# Patient Record
Sex: Female | Born: 1945 | Race: White | Hispanic: No | Smoking: Former smoker
Health system: Southern US, Community
[De-identification: ages and names within clinical notes are randomized; demographics above are authoritative.]

## PROBLEM LIST (undated history)

## (undated) DIAGNOSIS — F0390 Unspecified dementia without behavioral disturbance: Secondary | ICD-10-CM

## (undated) DIAGNOSIS — F32A Depression, unspecified: Secondary | ICD-10-CM

## (undated) DIAGNOSIS — T148XXA Other injury of unspecified body region, initial encounter: Secondary | ICD-10-CM

## (undated) DIAGNOSIS — I509 Heart failure, unspecified: Secondary | ICD-10-CM

## (undated) DIAGNOSIS — H9190 Unspecified hearing loss, unspecified ear: Secondary | ICD-10-CM

## (undated) DIAGNOSIS — IMO0001 Reserved for inherently not codable concepts without codable children: Secondary | ICD-10-CM

## (undated) DIAGNOSIS — F419 Anxiety disorder, unspecified: Secondary | ICD-10-CM

## (undated) DIAGNOSIS — I5032 Chronic diastolic (congestive) heart failure: Secondary | ICD-10-CM

## (undated) DIAGNOSIS — I10 Essential (primary) hypertension: Secondary | ICD-10-CM

## (undated) DIAGNOSIS — F329 Major depressive disorder, single episode, unspecified: Secondary | ICD-10-CM

## (undated) DIAGNOSIS — Z9981 Dependence on supplemental oxygen: Secondary | ICD-10-CM

## (undated) DIAGNOSIS — E785 Hyperlipidemia, unspecified: Secondary | ICD-10-CM

## (undated) DIAGNOSIS — R195 Other fecal abnormalities: Secondary | ICD-10-CM

## (undated) DIAGNOSIS — K5790 Diverticulosis of intestine, part unspecified, without perforation or abscess without bleeding: Secondary | ICD-10-CM

## (undated) DIAGNOSIS — J449 Chronic obstructive pulmonary disease, unspecified: Secondary | ICD-10-CM

## (undated) HISTORY — DX: Diverticulosis of intestine, part unspecified, without perforation or abscess without bleeding: K57.90

## (undated) HISTORY — DX: Depression, unspecified: F32.A

## (undated) HISTORY — DX: Other fecal abnormalities: R19.5

## (undated) HISTORY — DX: Major depressive disorder, single episode, unspecified: F32.9

## (undated) HISTORY — DX: Hyperlipidemia, unspecified: E78.5

## (undated) HISTORY — DX: Reserved for inherently not codable concepts without codable children: IMO0001

## (undated) HISTORY — DX: Essential (primary) hypertension: I10

## (undated) HISTORY — DX: Anxiety disorder, unspecified: F41.9

## (undated) HISTORY — DX: Other injury of unspecified body region, initial encounter: T14.8XXA

## (undated) HISTORY — DX: Unspecified hearing loss, unspecified ear: H91.90

## (undated) HISTORY — PX: OTHER SURGICAL HISTORY: SHX169

---

## 1990-05-13 HISTORY — PX: FEMUR FRACTURE SURGERY: SHX633

## 2004-08-10 ENCOUNTER — Ambulatory Visit: Payer: Self-pay | Admitting: Internal Medicine

## 2005-05-13 DIAGNOSIS — K5790 Diverticulosis of intestine, part unspecified, without perforation or abscess without bleeding: Secondary | ICD-10-CM

## 2005-05-13 HISTORY — PX: COLONOSCOPY: SHX174

## 2005-05-13 HISTORY — DX: Diverticulosis of intestine, part unspecified, without perforation or abscess without bleeding: K57.90

## 2005-06-28 ENCOUNTER — Ambulatory Visit: Payer: Self-pay | Admitting: Internal Medicine

## 2008-02-11 ENCOUNTER — Other Ambulatory Visit: Payer: Self-pay

## 2008-02-12 ENCOUNTER — Other Ambulatory Visit: Payer: Self-pay

## 2008-02-12 ENCOUNTER — Inpatient Hospital Stay: Payer: Self-pay | Admitting: Internal Medicine

## 2009-05-13 DIAGNOSIS — T148XXA Other injury of unspecified body region, initial encounter: Secondary | ICD-10-CM

## 2009-05-13 HISTORY — DX: Other injury of unspecified body region, initial encounter: T14.8XXA

## 2009-11-29 ENCOUNTER — Inpatient Hospital Stay (HOSPITAL_COMMUNITY)
Admission: EM | Admit: 2009-11-29 | Discharge: 2009-12-01 | Payer: Self-pay | Source: Home / Self Care | Admitting: Emergency Medicine

## 2010-01-04 ENCOUNTER — Ambulatory Visit (HOSPITAL_COMMUNITY): Admission: RE | Admit: 2010-01-04 | Discharge: 2010-01-04 | Payer: Self-pay | Admitting: Orthopedic Surgery

## 2010-01-10 ENCOUNTER — Emergency Department: Payer: Self-pay | Admitting: Unknown Physician Specialty

## 2010-02-27 ENCOUNTER — Ambulatory Visit (HOSPITAL_COMMUNITY): Admission: RE | Admit: 2010-02-27 | Discharge: 2010-02-27 | Payer: Self-pay | Admitting: Neurosurgery

## 2010-04-13 ENCOUNTER — Ambulatory Visit: Payer: Self-pay | Admitting: Specialist

## 2010-04-20 ENCOUNTER — Ambulatory Visit: Payer: Self-pay | Admitting: Specialist

## 2010-07-28 LAB — CBC
Hemoglobin: 13 g/dL (ref 12.0–15.0)
RBC: 3.83 MIL/uL — ABNORMAL LOW (ref 3.87–5.11)
WBC: 5.8 10*3/uL (ref 4.0–10.5)

## 2010-07-28 LAB — BASIC METABOLIC PANEL
Calcium: 8.7 mg/dL (ref 8.4–10.5)
GFR calc Af Amer: >60 mL/min (ref >60)
GFR calc non Af Amer: >60 mL/min (ref >60)
Potassium: 3.7 mEq/L (ref 3.5–5.1)
Sodium: 138 mEq/L (ref 135–145)

## 2011-03-11 ENCOUNTER — Emergency Department: Payer: Self-pay | Admitting: Emergency Medicine

## 2011-05-13 ENCOUNTER — Inpatient Hospital Stay: Payer: Self-pay | Admitting: Internal Medicine

## 2011-05-14 LAB — CBC WITH DIFFERENTIAL/PLATELET
Basophil %: 0.4 %
Eosinophil #: 0 10*3/uL (ref 0.0–0.7)
Eosinophil %: 0.6 %
HGB: 13.1 g/dL (ref 12.0–16.0)
Lymphocyte #: 1.6 10*3/uL (ref 1.0–3.6)
MCH: 31 pg (ref 26.0–34.0)
Monocyte #: 0.5 10*3/uL (ref 0.0–0.7)
Neutrophil #: 3.9 10*3/uL (ref 1.4–6.5)
Neutrophil %: 63.9 %
RBC: 4.25 10*6/uL (ref 3.80–5.20)
WBC: 6.1 10*3/uL (ref 3.6–11.0)

## 2011-05-14 LAB — COMPREHENSIVE METABOLIC PANEL
Albumin: 3.3 g/dL — ABNORMAL LOW (ref 3.4–5.0)
Alkaline Phosphatase: 74 U/L (ref 50–136)
Anion Gap: 12 (ref 7–16)
BUN: 20 mg/dL — ABNORMAL HIGH (ref 7–18)
Chloride: 104 mmol/L (ref 98–107)
Creatinine: 0.98 mg/dL (ref 0.60–1.30)
Potassium: 3.1 mmol/L — ABNORMAL LOW (ref 3.5–5.1)
SGOT(AST): 16 U/L (ref 15–37)
Sodium: 139 mmol/L (ref 136–145)

## 2011-05-14 LAB — TROPONIN I: Troponin-I: 0.06 ng/mL — ABNORMAL HIGH

## 2011-05-14 LAB — HEMOGLOBIN A1C: Hemoglobin A1C: 6.4 % — ABNORMAL HIGH (ref 4.2–6.3)

## 2011-05-14 LAB — MAGNESIUM: Magnesium: 1.5 mg/dL — ABNORMAL LOW

## 2011-05-14 LAB — LIPID PANEL
HDL Cholesterol: 50 mg/dL (ref 40–60)
VLDL Cholesterol, Calc: 46 mg/dL — ABNORMAL HIGH (ref 5–40)

## 2011-05-15 LAB — CBC WITH DIFFERENTIAL/PLATELET
Basophil #: 0.1 10*3/uL (ref 0.0–0.1)
Eosinophil #: 0 10*3/uL (ref 0.0–0.7)
HCT: 39.5 % (ref 35.0–47.0)
Lymphocyte %: 11.8 %
Monocyte #: 0.7 10*3/uL (ref 0.0–0.7)
Monocyte %: 7.2 %
Neutrophil #: 7.3 10*3/uL — ABNORMAL HIGH (ref 1.4–6.5)
Neutrophil %: 80.3 %
RDW: 12.9 % (ref 11.5–14.5)
WBC: 9.3 10*3/uL (ref 3.6–11.0)

## 2011-05-15 LAB — BASIC METABOLIC PANEL
Anion Gap: 12 (ref 7–16)
Chloride: 107 mmol/L (ref 98–107)
Co2: 22 mmol/L (ref 21–32)
Creatinine: 0.76 mg/dL (ref 0.60–1.30)
Osmolality: 283 (ref 275–301)
Potassium: 3.6 mmol/L (ref 3.5–5.1)

## 2011-05-15 LAB — CK TOTAL AND CKMB (NOT AT ARMC): CK-MB: 6.5 ng/mL — ABNORMAL HIGH (ref 0.5–3.6)

## 2011-05-16 ENCOUNTER — Inpatient Hospital Stay: Payer: Self-pay | Admitting: Psychiatry

## 2011-05-17 LAB — URINALYSIS, COMPLETE
Blood: NEGATIVE
Nitrite: NEGATIVE
Protein: NEGATIVE
Specific Gravity: 1.016 (ref 1.003–1.030)

## 2011-05-17 LAB — BEHAVIORAL MEDICINE 1 PANEL
Albumin: 3.7 g/dL (ref 3.4–5.0)
Alkaline Phosphatase: 69 U/L (ref 50–136)
BUN: 15 mg/dL (ref 7–18)
Basophil %: 0.1 %
Bilirubin,Total: 0.4 mg/dL (ref 0.2–1.0)
Eosinophil #: 0.1 10*3/uL (ref 0.0–0.7)
Eosinophil %: 1.5 %
HCT: 36 % (ref 35.0–47.0)
HGB: 12.4 g/dL (ref 12.0–16.0)
MCH: 32.3 pg (ref 26.0–34.0)
MCHC: 34.5 g/dL (ref 32.0–36.0)
MCV: 94 fL (ref 80–100)
Monocyte #: 0.4 10*3/uL (ref 0.0–0.7)
Neutrophil #: 3.7 10*3/uL (ref 1.4–6.5)
Neutrophil %: 71.1 %
Osmolality: 282 (ref 275–301)
Potassium: 3.5 mmol/L (ref 3.5–5.1)
RBC: 3.85 10*6/uL (ref 3.80–5.20)
Thyroid Stimulating Horm: 2.44 u[IU]/mL
Total Protein: 7.2 g/dL (ref 6.4–8.2)

## 2011-05-18 ENCOUNTER — Inpatient Hospital Stay: Payer: Self-pay | Admitting: Specialist

## 2011-05-18 LAB — BASIC METABOLIC PANEL
Calcium, Total: 9.1 mg/dL (ref 8.5–10.1)
Chloride: 107 mmol/L (ref 98–107)
Co2: 24 mmol/L (ref 21–32)
Creatinine: 0.8 mg/dL (ref 0.60–1.30)
EGFR (African American): >60
Potassium: 3.5 mmol/L (ref 3.5–5.1)

## 2011-05-18 LAB — CBC WITH DIFFERENTIAL/PLATELET
HCT: 34 % — ABNORMAL LOW (ref 35.0–47.0)
Lymphocyte %: 7 %
Monocyte %: 6.3 %
Neutrophil #: 10.3 10*3/uL — ABNORMAL HIGH (ref 1.4–6.5)
Platelet: 208 10*3/uL (ref 150–440)
RDW: 14.2 % (ref 11.5–14.5)
WBC: 11.9 10*3/uL — ABNORMAL HIGH (ref 3.6–11.0)

## 2011-05-18 LAB — FOLATE: Folic Acid: 12.4 ng/mL (ref 3.1–100.0)

## 2011-05-19 LAB — BASIC METABOLIC PANEL
Anion Gap: 12 (ref 7–16)
BUN: 16 mg/dL (ref 7–18)
EGFR (Non-African Amer.): >60
Glucose: 90 mg/dL (ref 65–99)
Osmolality: 286 (ref 275–301)

## 2011-05-20 LAB — MAGNESIUM: Magnesium: 1.5 mg/dL — ABNORMAL LOW

## 2013-01-13 DIAGNOSIS — S81801A Unspecified open wound, right lower leg, initial encounter: Secondary | ICD-10-CM | POA: Insufficient documentation

## 2013-01-26 ENCOUNTER — Ambulatory Visit (INDEPENDENT_AMBULATORY_CARE_PROVIDER_SITE_OTHER): Payer: Medicare Other | Admitting: General Surgery

## 2013-01-26 ENCOUNTER — Encounter: Payer: Self-pay | Admitting: General Surgery

## 2013-01-26 VITALS — BP 150/70 | HR 80 | Resp 16 | Ht 64.0 in | Wt 179.0 lb

## 2013-01-26 DIAGNOSIS — S81009A Unspecified open wound, unspecified knee, initial encounter: Secondary | ICD-10-CM

## 2013-01-26 DIAGNOSIS — S81001A Unspecified open wound, right knee, initial encounter: Secondary | ICD-10-CM

## 2013-01-26 DIAGNOSIS — S81801A Unspecified open wound, right lower leg, initial encounter: Secondary | ICD-10-CM
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
## 2013-01-26 NOTE — Patient Instructions (Addendum)
Patient to return on Thursday 01/28/13 for nurse visit.

## 2013-01-27 ENCOUNTER — Encounter: Payer: Self-pay | Admitting: General Surgery

## 2013-01-28 ENCOUNTER — Ambulatory Visit (INDEPENDENT_AMBULATORY_CARE_PROVIDER_SITE_OTHER): Payer: Medicare Other | Admitting: *Deleted

## 2013-01-28 DIAGNOSIS — S81009A Unspecified open wound, unspecified knee, initial encounter: Secondary | ICD-10-CM

## 2013-01-28 DIAGNOSIS — S81001A Unspecified open wound, right knee, initial encounter: Secondary | ICD-10-CM

## 2013-01-28 NOTE — Progress Notes (Signed)
norco 5/325 mg 1 tablet q4 hr po prn pain #40 no refills called in per Dr Lemar Livings Wound with more red irritation and edema of surrounding tissue. Visible drainage from duoderm noted. [employer] broadcast assistant, unna boot care reviewed with patient. Follow up with Dr Lemar Livings on Monday for further evaluation and progress.

## 2013-01-28 NOTE — Patient Instructions (Addendum)
Monday for unna boot change

## 2013-01-31 LAB — AEROBIC CULTURE

## 2013-02-01 ENCOUNTER — Encounter: Payer: Self-pay | Admitting: General Surgery

## 2013-02-01 ENCOUNTER — Ambulatory Visit (INDEPENDENT_AMBULATORY_CARE_PROVIDER_SITE_OTHER): Payer: Medicare Other | Admitting: General Surgery

## 2013-02-01 VITALS — BP 140/80 | HR 88 | Resp 14 | Ht 64.0 in | Wt 180.0 lb

## 2013-02-01 DIAGNOSIS — S81001A Unspecified open wound, right knee, initial encounter: Secondary | ICD-10-CM

## 2013-02-01 DIAGNOSIS — S81009A Unspecified open wound, unspecified knee, initial encounter: Secondary | ICD-10-CM | POA: Insufficient documentation

## 2013-02-01 MED ORDER — AMOXICILLIN-POT CLAVULANATE 875-125 MG PO TABS: 1.0000 | ORAL_TABLET | Freq: Two times a day (BID) | ORAL | Status: AC
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
## 2013-02-01 NOTE — Patient Instructions (Signed)
Patient to return on Tuesday 02/09/13 for a nurse visit.

## 2013-02-03 ENCOUNTER — Ambulatory Visit: Payer: Medicare Other | Admitting: General Surgery

## 2013-02-09 ENCOUNTER — Ambulatory Visit (INDEPENDENT_AMBULATORY_CARE_PROVIDER_SITE_OTHER): Payer: Medicare Other | Admitting: *Deleted

## 2013-02-09 DIAGNOSIS — S81009A Unspecified open wound, unspecified knee, initial encounter: Secondary | ICD-10-CM

## 2013-02-09 DIAGNOSIS — S81001A Unspecified open wound, right knee, initial encounter: Secondary | ICD-10-CM

## 2013-02-09 NOTE — Patient Instructions (Signed)
The patient is aware to call back for any questions or concerns.  

## 2013-02-09 NOTE — Progress Notes (Signed)
The patient came in today for unna boot dressing change.  Right leg were washed with soap and water.  Unna boot, kerlix and coban applied.  Edema improving.The area of concern is improving. Follow up as scheduled.

## 2013-02-16 ENCOUNTER — Ambulatory Visit: Payer: Medicare Other | Admitting: *Deleted

## 2013-02-16 DIAGNOSIS — S81001A Unspecified open wound, right knee, initial encounter: Secondary | ICD-10-CM

## 2013-02-16 NOTE — Patient Instructions (Addendum)
Neosporin and band aid until area completely healed Keep area clean Follow up as needed

## 2013-02-16 NOTE — Progress Notes (Signed)
Unna boot removed. Area of concern has greatly improved.  Only a tiny open area remains.

## 2013-12-22 ENCOUNTER — Encounter: Payer: Self-pay | Admitting: General Surgery

## 2013-12-22 ENCOUNTER — Ambulatory Visit (INDEPENDENT_AMBULATORY_CARE_PROVIDER_SITE_OTHER): Payer: Medicare Other | Admitting: General Surgery

## 2013-12-22 VITALS — BP 184/76 | HR 88 | Temp 98.1°F | Resp 14 | Ht 66.0 in | Wt 180.0 lb

## 2013-12-22 DIAGNOSIS — S81812A Laceration without foreign body, left lower leg, initial encounter: Secondary | ICD-10-CM

## 2013-12-22 DIAGNOSIS — S81809A Unspecified open wound, unspecified lower leg, initial encounter: Secondary | ICD-10-CM
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
## 2013-12-22 NOTE — Patient Instructions (Signed)
The patient is aware to call back for any questions or concerns.  

## 2013-12-23 DIAGNOSIS — S81819A Laceration without foreign body, unspecified lower leg, initial encounter: Secondary | ICD-10-CM | POA: Insufficient documentation

## 2013-12-27 ENCOUNTER — Telehealth: Payer: Self-pay | Admitting: *Deleted

## 2013-12-27 NOTE — Telephone Encounter (Signed)
Message copied by Dominga Ferry on Mon Dec 27, 2013  2:14 PM ------      Message from: [place], [place]      Created: Thu Dec 23, 2013  6:17 PM       The patient reports that she call for an appointment and was denied one, even though she had been seen for a similar problem within the past year. Please investigate. ------

## 2013-12-27 NOTE — Telephone Encounter (Signed)
Message for patient to call the office.  

## 2013-12-28 ENCOUNTER — Ambulatory Visit (INDEPENDENT_AMBULATORY_CARE_PROVIDER_SITE_OTHER): Payer: Medicare Other | Admitting: General Surgery

## 2013-12-28 ENCOUNTER — Encounter: Payer: Self-pay | Admitting: General Surgery

## 2013-12-28 VITALS — BP 132/60 | HR 72 | Resp 12 | Ht 66.0 in | Wt 177.0 lb

## 2013-12-28 DIAGNOSIS — L97909 Non-pressure chronic ulcer of unspecified part of unspecified lower leg with unspecified severity: Secondary | ICD-10-CM

## 2013-12-28 DIAGNOSIS — S81802D Unspecified open wound, left lower leg, subsequent encounter: Secondary | ICD-10-CM
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
## 2013-12-28 NOTE — Patient Instructions (Signed)
Nurse one week

## 2013-12-29 DIAGNOSIS — S81802A Unspecified open wound, left lower leg, initial encounter: Secondary | ICD-10-CM | POA: Insufficient documentation

## 2014-01-04 ENCOUNTER — Ambulatory Visit: Payer: Self-pay | Admitting: *Deleted

## 2014-01-04 DIAGNOSIS — S81802D Unspecified open wound, left lower leg, subsequent encounter: Secondary | ICD-10-CM

## 2014-01-04 NOTE — Patient Instructions (Signed)
One week

## 2014-01-04 NOTE — Progress Notes (Signed)
The patient came in today for unna boot dressing change.  Left leg was washed with soap and water.  Unna boot, kerlix and coban applied.  Edema improving.The area of concern is improving. Measures 1.3 x 3. Clean, no infection. Follow up as scheduled.

## 2014-01-04 NOTE — Addendum Note (Signed)
Addended by: Carson Myrtle on: 01/04/2014 11:05 AM   Modules accepted: Level of Service

## 2014-01-06 ENCOUNTER — Telehealth: Payer: Self-pay | Admitting: *Deleted

## 2014-01-06 NOTE — Telephone Encounter (Signed)
Patient reports that she did not call to try to make an appointment prior to the original appointment that was scheduled for 12-22-13 which was made by Dr. Brynda Greathouse.   This patient was reminded of follow up appointment with this office next week. She verbalizes understanding of date and time.

## 2014-01-11 ENCOUNTER — Encounter: Payer: Self-pay | Admitting: General Surgery

## 2014-01-11 ENCOUNTER — Ambulatory Visit (INDEPENDENT_AMBULATORY_CARE_PROVIDER_SITE_OTHER): Payer: Medicare Other | Admitting: General Surgery

## 2014-01-11 VITALS — BP 134/72 | HR 76 | Resp 12 | Ht 66.0 in | Wt 175.0 lb

## 2014-01-11 DIAGNOSIS — S81809A Unspecified open wound, unspecified lower leg, initial encounter: Secondary | ICD-10-CM

## 2014-01-11 DIAGNOSIS — S81812A Laceration without foreign body, left lower leg, initial encounter: Secondary | ICD-10-CM
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
## 2014-01-11 NOTE — Patient Instructions (Signed)
Patient to return as needed. The patient is aware to call back for any questions or concerns. 

## 2014-03-14 ENCOUNTER — Encounter: Payer: Self-pay | Admitting: General Surgery

## 2014-07-14 ENCOUNTER — Other Ambulatory Visit: Payer: Self-pay | Admitting: *Deleted

## 2014-09-04 NOTE — Discharge Summary (Signed)
PATIENT NAME:  Rachel [name], Rachel [name] MR#:  [id number] DATE OF BIRTH:  March 29, 1946  DATE OF ADMISSION:  05/13/2011 DATE OF DISCHARGE:  05/16/2011  PRIMARY CARE PHYSICIAN: Nicky Pugh, MD  DISCHARGE DIAGNOSES:  1. Encephalopathy/altered mental status, now resolved, likely due to drug overdose (30 tablets of Ambien, 20 tablets of Xanax, 10 tablets of hydrocodone, and 10 to 12 tablets of oxycodone, as per her husband).  2. 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  3. 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  4. Abdominal pain, likely due to drug overdose, possible acute on chronic functional gastrointestinal disorder, on Bentyl. 5. Hyperkalemia, now resolved.   SECONDARY DIAGNOSES:  1. Hypertension.  2. Chronic pain syndrome.  3. Depression/anxiety.   CONSULTANTS: Felizardo Hoffmann, MD - Psychiatry.  PROCEDURES/RADIOLOGY:  CT scan of the head without contrast on 05/13/2011: No acute intracranial process.   Abdominal flat and erect on 05/14/2011: nonobstructive bowel gas pattern.   MAJOR LABORATORY PANEL: Urinalysis on admission was negative.   Blood cultures x2 were negative.   Urine culture was negative.   HISTORY AND SHORT HOSPITAL COURSE: The patient is a 69 year old female with the above-mentioned medical problems who was admitted for metabolic encephalopathy likely secondary to drug overdose. There was no exact description of what she overdosed on at admission, although the next day her husband brought the exact dosage and tablets of overdose and the patient needed critical care unit transfer due to malignant hypertension as blood pressure consistently stayed in the 180s to 200 and it was difficult to control on the medical floor. She was slowly started on benzodiazepine withdrawal protocol as she seemed to be having benzodiazepine withdrawal. Psychiatry consultation  was obtained with Dr. Rhona Raider who agreed with the same and was placed on withdrawal protocol. She responded fairly well. Her blood pressure seemed to be much better controlled after that and she was transferred to inpatient Behavioral Medicine after discussion with Dr. Rhona Raider who accepted the patient for transfer.   DISCHARGE PHYSICAL EXAMINATION:  VITALS: On the date of discharge, her temperature was 98.2, heart rate 78 per minute, respirations 14 per minute, blood pressure 130/74 mmHg, and she was saturating 98% on 2 liters oxygen via nasal cannula.   HEART:  S1 and S2 normal. No murmurs, rubs, or gallops.   LUNGS: Clear to auscultation bilaterally. No wheezes, rales, rhonchi, or crepitation.   ABDOMEN: Soft, benign.   NEUROLOGIC: Nonfocal examination.   All other physical examination remained at the baseline.   DISCHARGE MEDICATIONS:  1. Bentyl 20 mg p.o. four times daily for 3 days as needed.  2. Zofran 8 mg p.o. three times daily as needed.  3. Cardizem 120 mg p.o. three times daily. 4. Dulcolax as needed. 5. Glucosamine with chondroitin 1 tablet three times daily. 6. Caltrate with vitamin D 1 tablet p.o. twice a day. 7. Phentermine 37.5 mg p.o. daily, psychiatric medication per Dr. Rhona Raider. 8. Norvasc 5 mg p.o. daily.  9. Hydralazine 10 mg p.o. four times daily.   DISCHARGE DIET: Low sodium.   DISCHARGE ACTIVITY: As tolerated.   DISCHARGE INSTRUCTIONS AND FOLLOW-UP: The patient was instructed to follow-up with her primary care physician, Dr. Nicky Pugh, in 1 to 2 weeks after discharge from the psychiatry floor. She will need follow-up with psychiatry as an outpatient in 2 to 3 weeks.   TOTAL TIME DISCHARGING THIS PATIENT: 55 minutes. ____________________________ Lucina Mellow. Manuella Ghazi, MD vss:slb D: 05/19/2011 23:57:00  ET T: 05/21/2011 12:06:31 ET JOB#: [id number]  cc: Trinity Haun S. Manuella Ghazi, MD, <Dictator> Mikeal Hawthorne. Brynda Greathouse, MD Drue Stager. [place], MD Remer Macho  MD ELECTRONICALLY SIGNED 05/21/2011 19:52

## 2014-09-04 NOTE — Discharge Summary (Signed)
PATIENT NAME:  Rachel, [name] MR#:  [id number] DATE OF BIRTH:  04-30-1946  DATE OF ADMISSION:  05/16/2011 DATE OF DISCHARGE:  05/18/2011  HISTORY OF PRESENT ILLNESS: Rachel [name] is a 69 year old female admitted to the Behavioral Medicine Unit on 05/16/2011 due to depression. She was initially admitted to the general medical portion of the [facility] after an intentional overdose on the 31st of December 2012.   Please see the admission history and physical dictation as well as the undersigned's consultation.   The patient had required admission to a hospital in [place], [place] for five days approximately one month prior to admission for malignant hypertension. Her hyperintensive medications upon admission were clonidine 0.3 mg t.i.d. and Cardizem 120 mg 3 times daily.   It was unclear to what extent Rachel [name] was drinking alcohol as well as possibly misusing her using her Xanax. Therefore, an Ativan CIWA. protocol was initiated while Rachel [name] was on the medical floor.   Initially on the general medical Rachel [name] demonstrated tremor as well as sweats along with nausea and vomiting. She was given a total of three doses of 2 mg lorazepam between January 1st and 11:00 p.m. on January 2nd. After that she no longer met CIWA criteria for Ativan use.   She was transferred to the inpatient Behavioral Medicine Unit for further evaluation and treatment of depression. She was initiated on Cymbalta and this was increased to 20 mg at bedtime.   Rachel [name] was transferred to the inpatient Behavioral Medicine Unit with the recommendation of Norvasc 5 milligrams daily and Hydralazine 10 mg q.i.d.    CONDITION ON DISCHARGE: By the 5th of January Rachel [name]' blood pressure increased to 233/113. This was accompanied by a pulse of 130. She continued with no tremulousness on the 4th of January as well as no sweating and no fever. By a graft on the 5th of January at the time of her severe blood pressure and  pulse elevation, there was no fever. She did not meet CIWA criteria for withdrawal. By nursing report she experienced a grand mal seizure and required transfer to a general medical ward.   MENTAL STATUS EXAM UPON DISCHARGE: The undersigned was not present in the hospital at the time of discharge (the undersigned was not on-call and making rounds) however, the undersigned was informed by the nurses of all signs and symptoms that Rachel [name] was displaying. They stated that after the grand mal seizure, she was in a typical post ictal delirium state and that she had not been demonstrating tremors, sweats, or fever. Therefore, she had not met any criteria to restart an Ativan CIWA protocol.   DISCHARGE DIAGNOSES:  AXIS I:  1. Major depressive disorder, recurrent, severe.  2. Anxiety disorder, not otherwise specified.  3. Delirium due to postictal state of grand mal seizure and malignant hypertension.  4. Rule out sedative hypnotic abuse and alcohol abuse.   AXIS II: Deferred.   AXIS III: Preadmission history of malignant hypertension with malignant hypertension and tachycardia persisting beyond criteria for the Ativan CIWA protocol, seizures secondary to malignant hypertension.   AXIS IV: General medical.   AXIS V: 15.   DIET: Unable to tolerate p.o. at the time of transfer.   ACTIVITY:  Per CCU protocol.   DISCHARGE MEDICATIONS: Hypertensive control per the general medical staff. There will be an neurology consult as well as Dilantin loading.   PSYCHIATRIC FOLLOW-UP: The undersigned will follow-up with Rachel [name] on the general medical floor.  ____________________________ Drue Stager. Asheton Scheffler, MD jsw:rbg D: 05/20/2011 18:58:00 ET T: 05/21/2011 15:31:30 ET   JOB#: [id number] [name] MD ELECTRONICALLY SIGNED 06/06/2011 11:51
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
## 2014-09-04 NOTE — Discharge Summary (Signed)
PATIENT NAME:  Rachel [name], Rachel [name] MR#:  [id number] DATE OF BIRTH:  [birth year]  DATE OF ADMISSION:  05/18/2011 DATE OF DISCHARGE:  05/20/2011  HISTORY AND PHYSICAL: For a detailed note, please take a look at the History and Physical done by Dr. Lyndel Safe on admission.   DIAGNOSES AT DISCHARGE:  1. Recurrent seizures secondary to possible malignant hypertension.   2. Benzodiazepine withdrawal complicated with underlying hypomagnesemia. 3. Malignant hypertension, now improved.  4. Anxiety/depression.  5. Status post left cochlear implant.   DIET: The patient is being discharged on a low-sodium diet.   ACTIVITY: As tolerated.   RESTRICTIONS: . The patient is to not drive until cleared and seen by Neurology.   FOLLOWUP:  1. Followup is with Dr. Brynda Greathouse in the next 1 to 2 weeks.  2. Follow up with Dr. Jennings Books in the next 1 to 2 weeks.   DISCHARGE MEDICATIONS:  1. Bentyl 20 mg q.i.d. as needed. 2. Caltrate 600 mg daily.  3. Xanax 0.5 mg t.i.d. as needed.  4. Fluoxetine 40 mg daily.  5. Colace 100 mg daily.  6. Lovastatin 20 mg daily.  7. Promethazine q.i.d. as needed.  8. Magnesium oxide 400 mg daily.  9. Fish oil daily.  10. Ambien 10 mg at bedtime.  11. Keppra 500 mg b.i.d.  12. Dilantin 100 mg b.i.d. x1 day, then Dilantin 100 mg daily x2 days then discontinue. 13. Cardizem CD 120 mg daily.  14. Amlodipine 5 mg daily.  15. Lisinopril 10 mg daily.  16. Hydralazine 25 mg q.i.d.   CONSULTANTS DURING THE HOSPITAL COURSE:  1. Dr. Rhona Raider from Psychiatry.  2. Dr.  Jennings Books from Neurology.   PERTINENT STUDIES: CT scan of the head done on January 5th showing no acute intracranial abnormality.   HOSPITAL COURSE: This is a 69 year old female who was transferred from the Behavioral Medicine Service to the Medical floor due to recurrent seizures.   1. Recurrent seizures: The exact etiology of the seizures is unclear but likely multifactorial. The patient had three episodes of  seizures prior to being transferred to the Medical floor. The patient was loaded with Dilantin and maintained on Dilantin. After being on Dilantin for a day, the patient had another couple of episodes of seizures which presented more like the patient had a blank stare and could not talk. It is quite questionable if this will be seizures or not. The patient was seen in consultation by Dr. Jennings Books, from Neurology, who thought the seizure could have been possibly related to malignant hypertension versus benzodiazepine withdrawal versus hypomagnesemia. The patient, after being on Dilantin, and started on Keppra, and loaded with Keppra, has not had any further seizure-type activity. Her mental status is pretty much back down to baseline. She did have an EEG done, the results of which are pending and can be followed up by Dr. Manuella Ghazi as an outpatient. Presently, the patient will be maintained on Keppra b.i.d. and will be discontinued off Dilantin on a taper as directed above.  2. Malignant hypertension: There was a concern that the patient was on clonidine prior to coming into the hospital, and the clonidine withdrawal led to her seizures and also malignant hypertension. She has not been restarted back on her clonidine. She is currently being maintained on some Norvasc, hydralazine and lisinopril, and her blood pressures have improved. She was a bit tachycardic, but she was taking a lot of Cardizem prior to coming in. Her Cardizem dose was decreased to 120 daily  along with continuation of her new antihypertensive as stated. As mentioned, her blood pressure has since then improved and stabilized.  3. Anxiety/depression: The patient was in the Behavioral Medicine Unit for treatment for her depression. She was seen in consultation by Dr. Rhona Raider, who discontinued her involuntary commitment. He did not think that the patient needed any inpatient psychiatric care, and the patient was maintained on her medications and  discharged on her fluoxetine. She needs aggressive outpatient psychiatric therapy which she was referred to prior to discharge.  4. Hyperlipidemia: The patient was maintained on her lovastatin, and she will resume that.   CODE STATUS:  The patient is a FULL CODE.     TIME SPENT: 40 minutes. ____________________________ Belia Heman. Verdell Carmine, MD vjs:cbb D: 05/20/2011 16:45:50 ET T: 05/21/2011 15:45:01 ET JOB#: [id number]  cc: Belia Heman. Verdell Carmine, MD, <Dictator> Mikeal Hawthorne. Brynda Greathouse, MD Hemang K. Manuella Ghazi, MD Henreitta Leber MD ELECTRONICALLY SIGNED 05/22/2011 16:07
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
## 2014-09-04 NOTE — H&P (Signed)
PATIENT NAME:  Rachel [name], Rachel [name] MR#:  [id number] DATE OF BIRTH:  1945-12-21  DATE OF ADMISSION:  05/16/2011  ADDENDUM:  PHYSICAL EXAMINATION: VITAL SIGNS: Temperature 97, pulse 85, respiratory rate 20, blood pressure 104/64.   Rachel [name] is examined with a female escort.   GENERAL APPEARANCE: Well-developed, well-nourished elderly female lying in a supine position in her hospital bed with no abnormal involuntary movements. Muscle tone is normal. No cachexia. Well groomed and hygiene is normal.   Please see the other History and Physical document for other exam elements.   HEENT: Pupils equally round and reactive to light and accommodation. Oropharynx without erythema. Hearing intact bilaterally to finger rub.   NECK: Supple, nontender.   RESPIRATORY: No abnormal breath sounds. There is no wheezing. There are no crackles or rhonchi.   CARDIOVASCULAR: Regular rate and rhythm. No murmurs, rubs or gallops.   ABDOMEN: Bowel sounds positive, soft, nontender.   GENITOURINARY: Deferred.   LYMPHATICS: No palpable nodes.   EXTREMITIES: No cyanosis, clubbing, or edema.   SKIN: Normal turgor. No rashes.   NEUROLOGIC: Cranial nerves II through XII intact. Sensory, general, intact throughout to light touch. Motor strength 5/5 throughout. Coordination intact by finger-to-nose bilaterally. Deep tendon reflexes normal strength and symmetry throughout. No Babinski.      ____________________________ Drue Stager. Yessenia Maillet, MD jsw:bjt D: 05/17/2011 23:57:51 ET T: 05/18/2011 09:04:09 ET JOB#: [id number]  cc: Drue Stager. Ethaniel Garfield, MD, <Dictator> [name] MD ELECTRONICALLY SIGNED 05/19/2011 21:47
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
## 2015-12-24 ENCOUNTER — Encounter: Payer: Self-pay | Admitting: Emergency Medicine

## 2015-12-24 ENCOUNTER — Emergency Department: Payer: Medicare Other

## 2015-12-24 ENCOUNTER — Inpatient Hospital Stay
Admission: EM | Admit: 2015-12-24 | Discharge: 2015-12-27 | DRG: 603 | Disposition: A | Discharge status: Home / Self Care | Payer: Medicare Other | Attending: Internal Medicine | Admitting: Internal Medicine

## 2015-12-24 DIAGNOSIS — R2681 Unsteadiness on feet: Secondary | ICD-10-CM | POA: Diagnosis present

## 2015-12-24 DIAGNOSIS — L03211 Cellulitis of face: Secondary | ICD-10-CM | POA: Diagnosis not present

## 2015-12-24 DIAGNOSIS — T380X5A Adverse effect of glucocorticoids and synthetic analogues, initial encounter: Secondary | ICD-10-CM | POA: Diagnosis present

## 2015-12-24 DIAGNOSIS — F17210 Nicotine dependence, cigarettes, uncomplicated: Secondary | ICD-10-CM | POA: Diagnosis present

## 2015-12-24 DIAGNOSIS — F329 Major depressive disorder, single episode, unspecified: Secondary | ICD-10-CM | POA: Diagnosis present

## 2015-12-24 DIAGNOSIS — B957 Other staphylococcus as the cause of diseases classified elsewhere: Secondary | ICD-10-CM | POA: Diagnosis present

## 2015-12-24 DIAGNOSIS — Z79899 Other long term (current) drug therapy: Secondary | ICD-10-CM

## 2015-12-24 DIAGNOSIS — L0201 Cutaneous abscess of face: Secondary | ICD-10-CM | POA: Diagnosis not present

## 2015-12-24 DIAGNOSIS — I10 Essential (primary) hypertension: Secondary | ICD-10-CM | POA: Diagnosis present

## 2015-12-24 DIAGNOSIS — H9190 Unspecified hearing loss, unspecified ear: Secondary | ICD-10-CM | POA: Diagnosis present

## 2015-12-24 DIAGNOSIS — M272 Inflammatory conditions of jaws: Secondary | ICD-10-CM | POA: Diagnosis present

## 2015-12-24 DIAGNOSIS — E785 Hyperlipidemia, unspecified: Secondary | ICD-10-CM | POA: Diagnosis present

## 2015-12-24 LAB — COMPREHENSIVE METABOLIC PANEL
ALBUMIN: 3.9 g/dL (ref 3.5–5.0)
ALT: 24 U/L (ref 14–54)
ANION GAP: 9 (ref 5–15)
AST: 34 U/L (ref 15–41)
Alkaline Phosphatase: 89 U/L (ref 38–126)
BILIRUBIN TOTAL: 0.9 mg/dL (ref 0.3–1.2)
BUN: 26 mg/dL — AB (ref 6–20)
CHLORIDE: 103 mmol/L (ref 101–111)
CO2: 23 mmol/L (ref 22–32)
Calcium: 9.3 mg/dL (ref 8.9–10.3)
Creatinine, Ser: 0.86 mg/dL (ref 0.44–1.00)
GFR calc Af Amer: >60 mL/min (ref >60)
GFR calc non Af Amer: >60 mL/min (ref >60)
GLUCOSE: 117 mg/dL — AB (ref 65–99)
POTASSIUM: 3.9 mmol/L (ref 3.5–5.1)
SODIUM: 135 mmol/L (ref 135–145)
TOTAL PROTEIN: 7.2 g/dL (ref 6.5–8.1)

## 2015-12-24 LAB — CBC WITH DIFFERENTIAL/PLATELET
BASOS ABS: 0 10*3/uL (ref 0–0.1)
BASOS PCT: 0 %
EOS PCT: 1 %
Eosinophils Absolute: 0.1 10*3/uL (ref 0–0.7)
HCT: 39.9 % (ref 35.0–47.0)
Hemoglobin: 13.8 g/dL (ref 12.0–16.0)
LYMPHS PCT: 10 %
Lymphs Abs: 1.1 10*3/uL (ref 1.0–3.6)
MCH: 31.6 pg (ref 26.0–34.0)
MCHC: 34.6 g/dL (ref 32.0–36.0)
MCV: 91.3 fL (ref 80.0–100.0)
MONO ABS: 0.9 10*3/uL (ref 0.2–0.9)
Monocytes Relative: 7 %
NEUTROS ABS: 10 10*3/uL — AB (ref 1.4–6.5)
Neutrophils Relative %: 82 %
PLATELETS: 207 10*3/uL (ref 150–440)
RBC: 4.37 MIL/uL (ref 3.80–5.20)
RDW: 13.2 % (ref 11.5–14.5)
WBC: 12.1 10*3/uL — AB (ref 3.6–11.0)

## 2015-12-24 LAB — LACTIC ACID, PLASMA: Lactic Acid, Venous: 0.8 mmol/L (ref 0.5–1.9)

## 2015-12-24 MED ORDER — CLINDAMYCIN PHOSPHATE 600 MG/50ML IV SOLN
600.0000 mg | Freq: Once | INTRAVENOUS | Status: AC
Start: 2015-12-24 — End: 2015-12-24
  Administered 2015-12-24: 600 mg via INTRAVENOUS
  Filled 2015-12-24: qty 50

## 2015-12-24 MED ORDER — ONDANSETRON HCL 4 MG/2ML IJ SOLN
4.0000 mg | Freq: Once | INTRAMUSCULAR | Status: AC
Start: 2015-12-24 — End: 2015-12-24
  Administered 2015-12-24: 4 mg via INTRAVENOUS

## 2015-12-24 MED ORDER — MORPHINE SULFATE (PF) 4 MG/ML IV SOLN
INTRAVENOUS | Status: AC
  Filled 2015-12-24: qty 1

## 2015-12-24 MED ORDER — ALPRAZOLAM 0.5 MG PO TABS
0.5000 mg | ORAL_TABLET | Freq: Every evening | ORAL | Status: DC | PRN
  Administered 2015-12-24 – 2015-12-26 (×3): 0.5 mg via ORAL
  Filled 2015-12-24 (×3): qty 1

## 2015-12-24 MED ORDER — MORPHINE SULFATE (PF) 4 MG/ML IV SOLN
4.0000 mg | Freq: Once | INTRAVENOUS | Status: AC
  Administered 2015-12-24: 4 mg via INTRAVENOUS

## 2015-12-24 MED ORDER — HYDROCODONE-ACETAMINOPHEN 5-325 MG PO TABS
1.0000 | ORAL_TABLET | ORAL | Status: DC | PRN
  Administered 2015-12-26 – 2015-12-27 (×2): 2 via ORAL
  Filled 2015-12-24 (×2): qty 1
  Filled 2015-12-24: qty 2

## 2015-12-24 MED ORDER — HYDROMORPHONE HCL 1 MG/ML IJ SOLN
INTRAMUSCULAR | Status: AC
  Administered 2015-12-24: 2 mg via INTRAVENOUS
  Filled 2015-12-24: qty 2

## 2015-12-24 MED ORDER — DILTIAZEM HCL 60 MG PO TABS
120.0000 mg | ORAL_TABLET | Freq: Three times a day (TID) | ORAL | Status: DC
Start: 2015-12-24 — End: 2015-12-27
  Administered 2015-12-24 – 2015-12-27 (×8): 120 mg via ORAL
  Filled 2015-12-24 (×8): qty 2

## 2015-12-24 MED ORDER — ENOXAPARIN SODIUM 40 MG/0.4ML ~~LOC~~ SOLN
40.0000 mg | SUBCUTANEOUS | Status: DC
  Administered 2015-12-24: 40 mg via SUBCUTANEOUS
  Filled 2015-12-24 (×3): qty 0.4

## 2015-12-24 MED ORDER — ONDANSETRON HCL 4 MG/2ML IJ SOLN: 4.0000 mg | Freq: Four times a day (QID) | INTRAMUSCULAR | Status: DC | PRN

## 2015-12-24 MED ORDER — VENLAFAXINE HCL ER 75 MG PO CP24
150.0000 mg | ORAL_CAPSULE | Freq: Every day | ORAL | Status: DC
  Administered 2015-12-25 – 2015-12-27 (×3): 150 mg via ORAL
  Filled 2015-12-24 (×3): qty 2

## 2015-12-24 MED ORDER — DEXAMETHASONE SODIUM PHOSPHATE 4 MG/ML IJ SOLN
4.0000 mg | Freq: Four times a day (QID) | INTRAMUSCULAR | Status: DC
  Administered 2015-12-24 – 2015-12-27 (×11): 4 mg via INTRAVENOUS
  Filled 2015-12-24 (×11): qty 1

## 2015-12-24 MED ORDER — SODIUM CHLORIDE 0.9 % IV SOLN
INTRAVENOUS | Status: DC
  Administered 2015-12-24 – 2015-12-27 (×5): via INTRAVENOUS

## 2015-12-24 MED ORDER — HYDROMORPHONE HCL 1 MG/ML IJ SOLN
2.0000 mg | Freq: Once | INTRAMUSCULAR | Status: AC
  Administered 2015-12-24: 2 mg via INTRAVENOUS

## 2015-12-24 MED ORDER — SODIUM CHLORIDE 0.9 % IV BOLUS (SEPSIS)
500.0000 mL | Freq: Once | INTRAVENOUS | Status: AC
  Administered 2015-12-24: 500 mL via INTRAVENOUS

## 2015-12-24 MED ORDER — ACETAMINOPHEN 650 MG RE SUPP: 650.0000 mg | Freq: Four times a day (QID) | RECTAL | Status: DC | PRN

## 2015-12-24 MED ORDER — PIPERACILLIN-TAZOBACTAM 3.375 G IVPB
3.3750 g | Freq: Three times a day (TID) | INTRAVENOUS | Status: DC
  Administered 2015-12-24 – 2015-12-27 (×8): 3.375 g via INTRAVENOUS
  Filled 2015-12-24 (×10): qty 50

## 2015-12-24 MED ORDER — HYDRALAZINE HCL 25 MG PO TABS
25.0000 mg | ORAL_TABLET | Freq: Three times a day (TID) | ORAL | Status: DC
  Administered 2015-12-24 – 2015-12-27 (×8): 25 mg via ORAL
  Filled 2015-12-24 (×8): qty 1

## 2015-12-24 MED ORDER — IOPAMIDOL (ISOVUE-300) INJECTION 61%
75.0000 mL | Freq: Once | INTRAVENOUS | Status: AC | PRN
  Administered 2015-12-24: 75 mL via INTRAVENOUS

## 2015-12-24 MED ORDER — ACETAMINOPHEN 325 MG PO TABS: 650.0000 mg | ORAL_TABLET | Freq: Four times a day (QID) | ORAL | Status: DC | PRN

## 2015-12-24 MED ORDER — PRAVASTATIN SODIUM 10 MG PO TABS
20.0000 mg | ORAL_TABLET | Freq: Every day | ORAL | Status: DC
  Administered 2015-12-25 – 2015-12-26 (×2): 20 mg via ORAL
  Filled 2015-12-24 (×2): qty 2

## 2015-12-24 MED ORDER — MELOXICAM 7.5 MG PO TABS
15.0000 mg | ORAL_TABLET | Freq: Every day | ORAL | Status: DC
  Administered 2015-12-25: 15 mg via ORAL
  Filled 2015-12-24 (×3): qty 2

## 2015-12-24 MED ORDER — NICOTINE 14 MG/24HR TD PT24
14.0000 mg | MEDICATED_PATCH | Freq: Every day | TRANSDERMAL | Status: DC
  Filled 2015-12-24 (×2): qty 1

## 2015-12-24 MED ORDER — ONDANSETRON HCL 4 MG PO TABS: 4.0000 mg | ORAL_TABLET | Freq: Four times a day (QID) | ORAL | Status: DC | PRN

## 2015-12-24 MED ORDER — MORPHINE SULFATE (PF) 2 MG/ML IV SOLN
2.0000 mg | INTRAVENOUS | Status: DC | PRN
  Administered 2015-12-25 – 2015-12-26 (×3): 2 mg via INTRAVENOUS
  Filled 2015-12-24 (×3): qty 1

## 2015-12-24 MED ORDER — VANCOMYCIN HCL IN DEXTROSE 1-5 GM/200ML-% IV SOLN
1000.0000 mg | Freq: Once | INTRAVENOUS | Status: AC
  Administered 2015-12-24: 20:00:00 1000 mg via INTRAVENOUS
  Filled 2015-12-24: qty 200

## 2015-12-24 MED ORDER — VANCOMYCIN HCL 500 MG IV SOLR
500.0000 mg | Freq: Two times a day (BID) | INTRAVENOUS | Status: DC
  Administered 2015-12-25: 01:00:00 500 mg via INTRAVENOUS
  Filled 2015-12-24 (×4): qty 500

## 2015-12-24 NOTE — ED Triage Notes (Signed)
Pt states yesterday she noticed her gums sore as she was wearing her dentures and pain continued after removing them.  Pt states also noticed a knot under her chin last night.  Woke up this morning with swelling to right cheek and pt states its going into her eye.  No respiratory distress and has been able to drink liquids.

## 2015-12-24 NOTE — Progress Notes (Signed)
Pharmacy Antibiotic Note  Rachel [name] is a 70 y.o. female admitted on 12/24/2015 with cellulitis.  Pharmacy has been consulted for vancomycin and piperacillin/tazobactam dosing.  Plan: Piperacillin/tazobactam 3.375 g IV q8h EI  Vancomycin 1000 mg one time dose followed by 500 mg IV q12h (6 hour stacked dose) Goal vancomycin trough 10-15 mcg/mL Vancomycin trough ordered for 8/15 @ 1230  Kinetics: Using adjusted body weight of 66 kg Ke: 0.057 Half-life: 12 hours Vd: 46 L  Height: 5\' 4"  (162.6 cm) Weight: 182 lb 14.4 oz (83 kg) IBW/kg (Calculated) : 54.7  Temp (24hrs), Avg:98 F (36.7 C), Min:97.5 F (36.4 C), Max:98.5 F (36.9 C)   Recent Labs Lab 12/24/15 1359 12/24/15 1513  WBC 12.1*  --   CREATININE 0.86  --   LATICACIDVEN  --  0.8    Estimated Creatinine Clearance: 63.4 mL/min (by C-G formula based on SCr of 0.86 mg/dL).    No Known Allergies  Antimicrobials this admission: vancomycin 8/13 >>  Piperacillin/tazobactam 8/13 >>   Dose adjustments this admission:  Microbiology results:  Thank you for allowing pharmacy to be a part of this patient's care.  Lenis Noon, PharmD Clinical Pharmacist 12/24/2015 7:09 PM

## 2015-12-24 NOTE — ED Notes (Signed)
Informed RN bed ready [phone]
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
## 2015-12-24 NOTE — ED Notes (Signed)
Patient transported to CT 
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
## 2015-12-24 NOTE — Progress Notes (Signed)
Pt's granddaughter brought in potato soup and chocolate milk shake.  Paged Dr. Tressia Miners to see if pt can eat.

## 2015-12-25 LAB — BASIC METABOLIC PANEL
ANION GAP: 7 (ref 5–15)
BUN: 22 mg/dL — ABNORMAL HIGH (ref 6–20)
CHLORIDE: 103 mmol/L (ref 101–111)
CO2: 26 mmol/L (ref 22–32)
CREATININE: 0.8 mg/dL (ref 0.44–1.00)
Calcium: 8.9 mg/dL (ref 8.9–10.3)
GFR calc non Af Amer: >60 mL/min (ref >60)
Glucose, Bld: 193 mg/dL — ABNORMAL HIGH (ref 65–99)
POTASSIUM: 4.4 mmol/L (ref 3.5–5.1)
SODIUM: 136 mmol/L (ref 135–145)

## 2015-12-25 LAB — BLOOD CULTURE ID PANEL (REFLEXED)
ACINETOBACTER BAUMANNII: NOT DETECTED
CANDIDA ALBICANS: NOT DETECTED
CANDIDA KRUSEI: NOT DETECTED
CARBAPENEM RESISTANCE: NOT DETECTED
Candida glabrata: NOT DETECTED
Candida parapsilosis: NOT DETECTED
Candida tropicalis: NOT DETECTED
ENTEROBACTER CLOACAE COMPLEX: NOT DETECTED
ENTEROBACTERIACEAE SPECIES: NOT DETECTED
Enterococcus species: NOT DETECTED
Escherichia coli: NOT DETECTED
Haemophilus influenzae: NOT DETECTED
KLEBSIELLA OXYTOCA: NOT DETECTED
KLEBSIELLA PNEUMONIAE: NOT DETECTED
Listeria monocytogenes: NOT DETECTED
Methicillin resistance: NOT DETECTED
NEISSERIA MENINGITIDIS: NOT DETECTED
PSEUDOMONAS AERUGINOSA: NOT DETECTED
Proteus species: NOT DETECTED
STAPHYLOCOCCUS AUREUS BCID: NOT DETECTED
STAPHYLOCOCCUS SPECIES: DETECTED — AB
STREPTOCOCCUS AGALACTIAE: NOT DETECTED
STREPTOCOCCUS PNEUMONIAE: NOT DETECTED
STREPTOCOCCUS SPECIES: NOT DETECTED
Serratia marcescens: NOT DETECTED
Streptococcus pyogenes: NOT DETECTED
VANCOMYCIN RESISTANCE: NOT DETECTED

## 2015-12-25 LAB — CBC
HEMATOCRIT: 36.4 % (ref 35.0–47.0)
HEMOGLOBIN: 12.8 g/dL (ref 12.0–16.0)
MCH: 32 pg (ref 26.0–34.0)
MCHC: 35.3 g/dL (ref 32.0–36.0)
MCV: 90.9 fL (ref 80.0–100.0)
PLATELETS: 188 10*3/uL (ref 150–440)
RBC: 4 MIL/uL (ref 3.80–5.20)
RDW: 13.5 % (ref 11.5–14.5)
WBC: 19.8 10*3/uL — AB (ref 3.6–11.0)

## 2015-12-25 MED ORDER — VANCOMYCIN HCL IN DEXTROSE 750-5 MG/150ML-% IV SOLN
750.0000 mg | Freq: Two times a day (BID) | INTRAVENOUS | Status: DC
  Administered 2015-12-25 – 2015-12-27 (×4): 750 mg via INTRAVENOUS
  Filled 2015-12-25 (×6): qty 150

## 2015-12-25 MED ORDER — VENLAFAXINE HCL ER 75 MG PO CP24
75.0000 mg | ORAL_CAPSULE | Freq: Every day | ORAL | Status: DC
  Administered 2015-12-26 – 2015-12-27 (×2): 75 mg via ORAL
  Filled 2015-12-25 (×2): qty 1

## 2015-12-25 MED ORDER — SENNOSIDES-DOCUSATE SODIUM 8.6-50 MG PO TABS
1.0000 | ORAL_TABLET | Freq: Every day | ORAL | Status: DC
  Administered 2015-12-25 – 2015-12-26 (×2): 1 via ORAL
  Filled 2015-12-25 (×2): qty 1

## 2015-12-25 NOTE — Progress Notes (Signed)
.. 12/25/2015 8:16 AM  Rachel [name] [id number]  Hospital Day 2    Temp:  [97.5 F (36.4 C)-98.9 F (37.2 C)] 98.6 F (37 C) (08/14 0423) Pulse Rate:  [99-115] 114 (08/14 0423) Resp:  [12-20] 20 (08/14 0423) BP: (130-170)/(73-91) 161/90 (08/14 0423) SpO2:  [93 %-99 %] 94 % (08/14 0423) Weight:  [81.6 kg (180 lb)-83 kg (182 lb 14.4 oz)] 83 kg (182 lb 14.4 oz) (08/13 1741),     Intake/Output Summary (Last 24 hours) at 12/25/15 0816 Last data filed at 12/25/15 0525  Gross per 24 hour  Intake            882.5 ml  Output                0 ml  Net            882.5 ml    Results for orders placed or performed during the hospital encounter of 12/24/15 (from the past 24 hour(s))  CBC with Differential     Status: Abnormal   Collection Time: 12/24/15  1:59 PM  Result Value Ref Range   WBC 12.1 (H) 3.6 - 11.0 K/uL   RBC 4.37 3.80 - 5.20 MIL/uL   Hemoglobin 13.8 12.0 - 16.0 g/dL   HCT 39.9 35.0 - 47.0 %   MCV 91.3 80.0 - 100.0 fL   MCH 31.6 26.0 - 34.0 pg   MCHC 34.6 32.0 - 36.0 g/dL   RDW 13.2 11.5 - 14.5 %   Platelets 207 150 - 440 K/uL   Neutrophils Relative % 82 %   Neutro Abs 10.0 (H) 1.4 - 6.5 K/uL   Lymphocytes Relative 10 %   Lymphs Abs 1.1 1.0 - 3.6 K/uL   Monocytes Relative 7 %   Monocytes Absolute 0.9 0.2 - 0.9 K/uL   Eosinophils Relative 1 %   Eosinophils Absolute 0.1 0 - 0.7 K/uL   Basophils Relative 0 %   Basophils Absolute 0.0 0 - 0.1 K/uL  Comprehensive metabolic panel     Status: Abnormal   Collection Time: 12/24/15  1:59 PM  Result Value Ref Range   Sodium 135 135 - 145 mmol/L   Potassium 3.9 3.5 - 5.1 mmol/L   Chloride 103 101 - 111 mmol/L   CO2 23 22 - 32 mmol/L   Glucose, Bld 117 (H) 65 - 99 mg/dL   BUN 26 (H) 6 - 20 mg/dL   Creatinine, Ser 0.86 0.44 - 1.00 mg/dL   Calcium 9.3 8.9 - 10.3 mg/dL   Total Protein 7.2 6.5 - 8.1 g/dL   Albumin 3.9 3.5 - 5.0 g/dL   AST 34 15 - 41 U/L   ALT 24 14 - 54 U/L   Alkaline Phosphatase 89 38 - 126 U/L   Total Bilirubin 0.9 0.3 - 1.2 mg/dL   GFR calc non Af Amer >60 >60 mL/min   GFR calc Af Amer >60 >60 mL/min   Anion gap 9 5 - 15  Lactic acid, plasma     Status: None   Collection Time: 12/24/15  3:13 PM  Result Value Ref Range   Lactic Acid, Venous 0.8 0.5 - 1.9 mmol/L  Basic metabolic panel     Status: Abnormal   Collection Time: 12/25/15  4:40 AM  Result Value Ref Range   Sodium 136 135 - 145 mmol/L   Potassium 4.4 3.5 - 5.1 mmol/L   Chloride 103 101 - 111 mmol/L   CO2 26 22 - 32 mmol/L  Glucose, Bld 193 (H) 65 - 99 mg/dL   BUN 22 (H) 6 - 20 mg/dL   Creatinine, Ser 0.80 0.44 - 1.00 mg/dL   Calcium 8.9 8.9 - 10.3 mg/dL   GFR calc non Af Amer >60 >60 mL/min   GFR calc Af Amer >60 >60 mL/min   Anion gap 7 5 - 15  CBC     Status: Abnormal   Collection Time: 12/25/15  4:40 AM  Result Value Ref Range   WBC 19.8 (H) 3.6 - 11.0 K/uL   RBC 4.00 3.80 - 5.20 MIL/uL   Hemoglobin 12.8 12.0 - 16.0 g/dL   HCT 36.4 35.0 - 47.0 %   MCV 90.9 80.0 - 100.0 fL   MCH 32.0 26.0 - 34.0 pg   MCHC 35.3 32.0 - 36.0 g/dL   RDW 13.5 11.5 - 14.5 %   Platelets 188 150 - 440 K/uL    SUBJECTIVE: OBJECTIVE:  No acute events.  Reports continued swelling but improved pain.  Difficulty hearing but that is baseline.  No breathing difficulty.  IMPRESSION:   GEN- NAD, responsive EARS- right cochlear implant in place OC/OP- improved tenderness and widening of mandible FACE-  Continued marked edema but less tender and less induration NECK-  Improved submental edema and induration    Impression:  Mandibular/Facial cellulitis with small abscess  Plan:  Agree with IV abx.  Improved some today.  Continue to follow.  No current need for drainage.    Rachel [name] 12/25/2015, 8:16 AM

## 2015-12-25 NOTE — Progress Notes (Addendum)
[facility] [place] NAME: Rachel [name]    MR#:  [id number]  DATE OF BIRTH:  [birth year]  SUBJECTIVE:   Doing well. Swelling much improved REVIEW OF SYSTEMS:   Review of Systems  Constitutional: Negative for chills, fever and weight loss.  HENT: Negative for ear discharge, ear pain and nosebleeds.   Eyes: Negative for blurred vision, pain and discharge.  Respiratory: Negative for sputum production, shortness of breath, wheezing and stridor.   Cardiovascular: Negative for chest pain, palpitations, orthopnea and PND.  Gastrointestinal: Negative for abdominal pain, diarrhea, nausea and vomiting.  Genitourinary: Negative for frequency and urgency.  Musculoskeletal: Negative for back pain and joint pain.  Neurological: Negative for sensory change, speech change, focal weakness and weakness.  Psychiatric/Behavioral: Negative for depression and hallucinations. The patient is not nervous/anxious.    Tolerating Diet: Tolerating PT:   DRUG ALLERGIES:  No Known Allergies  VITALS:  Blood pressure (!) 152/68, pulse (!) 114, temperature 98.6 F (37 C), resp. rate 20, height 5\' 4"  (1.626 m), weight 182 lb 14.4 oz (83 kg), SpO2 92 %.  PHYSICAL EXAMINATION:   Physical Exam  GENERAL:  70 y.o.-year-old patient lying in the bed with no acute distress.  EYES: Pupils equal, round, reactive to light and accommodation. No scleral icterus. Extraocular muscles intact.  HEENT: Head atraumatic, normocephalic. Oropharynx and nasopharynx clear. swellig of the right cheek, soft, decreased tenderness NECK:  Supple, no jugular venous distention. No thyroid enlargement, no tenderness.  LUNGS: Normal breath sounds bilaterally, no wheezing, rales, rhonchi. No use of accessory muscles of respiration.  CARDIOVASCULAR: S1, S2 normal. No murmurs, rubs, or gallops.  ABDOMEN: Soft, nontender, nondistended. Bowel sounds present. No organomegaly or mass.   EXTREMITIES: No cyanosis, clubbing or edema b/l.    NEUROLOGIC: Cranial nerves II through XII are intact. No focal Motor or sensory deficits b/l.   PSYCHIATRIC:  patient is alert and oriented x 3.  SKIN: No obvious rash, lesion, or ulcer.   LABORATORY PANEL:  CBC  Recent Labs Lab 12/25/15 0440  WBC 19.8*  HGB 12.8  HCT 36.4  PLT 188    Chemistries   Recent Labs Lab 12/24/15 1359 12/25/15 0440  NA 135 136  K 3.9 4.4  CL 103 103  CO2 23 26  GLUCOSE 117* 193*  BUN 26* 22*  CREATININE 0.86 0.80  CALCIUM 9.3 8.9  AST 34  --   ALT 24  --   ALKPHOS 89  --   BILITOT 0.9  --    Cardiac Enzymes No results for input(s): TROPONINI in the last 168 hours. RADIOLOGY:  Ct Soft Tissue Neck W Contrast  Result Date: 12/24/2015 CLINICAL DATA:  Right submandibular pain and swelling. EXAM: CT NECK WITH CONTRAST TECHNIQUE: Multidetector CT imaging of the neck was performed using the standard protocol following the bolus administration of intravenous contrast. CONTRAST:  29mL ISOVUE-300 IOPAMIDOL (ISOVUE-300) INJECTION 61% COMPARISON:  None. FINDINGS: Pharynx and larynx: Extensive soft tissue swelling in the right face overlying the maxilla and mandible. Obvious deformity. There is edema throughout the subcutaneous tissues. There is edema in the right masseter muscle. Deep to the masseter muscle and between the masseter and posterior body of the mandible, there is a large fluid collection measuring 8 x 19 mm on axial scans. This is most consistent with abscess given the history. No osteomyelitis. Patient is edentulous and no dental abscess identified Normal pharyngeal soft tissues. No mass. Airway is normal. Larynx  normal. Epiglottis normal. Salivary glands: Parotid and submandibular glands normal bilaterally. Thyroid: Normal Lymph nodes: Small cervical lymph nodes. No pathologic adenopathy in the neck. Vascular: Negative. Limited intracranial: Negative Visualized orbits: Negative Mastoids and  visualized paranasal sinuses: Paranasal sinuses clear. Right cochlear implant. Skeleton: Cervical disc degeneration and spondylosis. Facet degeneration. 3 mm anterior slip C4-5. Upper chest: Negative IMPRESSION: Extensive soft tissue swelling in the right face. 8 x 19 mm fluid collection between the masseter muscle and posterior body of the mandible on the right compatible with abscess. No osteomyelitis. Patient is edentulous and no dental abscess identified. Electronically Signed   By: Franchot Gallo M.D.   On: 12/24/2015 16:01   ASSESSMENT AND PLAN:  Kortni Bodnar  is a 70 y.o. female with a known history of Hypertension, hyperlipidemia, hearing loss presents to the hospital secondary to worsening right facial swelling and tenderness for 2 days now.  #1 right facial cellulitis with possible abscess near the mandible-no osteomyelitis noted.  -ENT consult apprciated .No  Need for  I and D at present - broad-spectrum antibiotics with vancomycin and Zosyn - IV decadron, IV abxs -prn pain medications -BC 1/2 staph aureus ID pending  #2 hypertension-continue home medications -On oral Cardizem and hydralazine  #3 hyperlipidemia-on statin  #4 DVT prophylaxis-Lovenox  Overall improving   Case discussed with Care Management/Social Worker. Management plans discussed with the patient, family and they are in agreement.  CODE STATUS: full  DVT Prophylaxis: lovenox  TOTAL TIME TAKING CARE OF THIS PATIENT: 40 minutes.  >50% time spent on counselling and coordination of care  POSSIBLE D/C IN 1DAYS, DEPENDING ON CLINICAL CONDITION.  Note: This dictation was prepared with Dragon dictation along with smaller phrase technology. Any transcriptional errors that result from this process are unintentional.  Remee Charley M.D on 12/25/2015 at 12:47 PM  Between 7am to 6pm - Pager - [phone]  After 6pm go to [email or web address] - password [email or web address] [facility]  Office   [phone]  CC: Primary care physician; Marden Noble, MD

## 2015-12-25 NOTE — Progress Notes (Signed)
PHARMACY - PHYSICIAN COMMUNICATION CRITICAL VALUE ALERT - BLOOD CULTURE IDENTIFICATION (BCID)  Results for orders placed or performed during the hospital encounter of 12/24/15  Blood Culture ID Panel (Reflexed) (Collected: 12/24/2015  3:13 PM)  Result Value Ref Range   Enterococcus species NOT DETECTED NOT DETECTED   Vancomycin resistance NOT DETECTED NOT DETECTED   Listeria monocytogenes NOT DETECTED NOT DETECTED   Staphylococcus species DETECTED (A) NOT DETECTED   Staphylococcus aureus NOT DETECTED NOT DETECTED   Methicillin resistance NOT DETECTED NOT DETECTED   Streptococcus species NOT DETECTED NOT DETECTED   Streptococcus agalactiae NOT DETECTED NOT DETECTED   Streptococcus pneumoniae NOT DETECTED NOT DETECTED   Streptococcus pyogenes NOT DETECTED NOT DETECTED   Acinetobacter baumannii NOT DETECTED NOT DETECTED   Enterobacteriaceae species NOT DETECTED NOT DETECTED   Enterobacter cloacae complex NOT DETECTED NOT DETECTED   Escherichia coli NOT DETECTED NOT DETECTED   Klebsiella oxytoca NOT DETECTED NOT DETECTED   Klebsiella pneumoniae NOT DETECTED NOT DETECTED   Proteus species NOT DETECTED NOT DETECTED   Serratia marcescens NOT DETECTED NOT DETECTED   Carbapenem resistance NOT DETECTED NOT DETECTED   Haemophilus influenzae NOT DETECTED NOT DETECTED   Neisseria meningitidis NOT DETECTED NOT DETECTED   Pseudomonas aeruginosa NOT DETECTED NOT DETECTED   Candida albicans NOT DETECTED NOT DETECTED   Candida glabrata NOT DETECTED NOT DETECTED   Candida krusei NOT DETECTED NOT DETECTED   Candida parapsilosis NOT DETECTED NOT DETECTED   Candida tropicalis NOT DETECTED NOT DETECTED    Name of physician (or Provider) Contacted: Dr Ara Kussmaul   Changes to prescribed antibiotics required: No .  Pt already on Vanc and Zosyn .   Shelbe Haglund D 12/25/2015  10:12 PM

## 2015-12-25 NOTE — Care Management (Addendum)
Admitted to [facility] with the diagnosis of facial abscess. Lives alone. Sister is Olivia Mackie [phone]) Last seen Dr. Brynda Greathouse 1-2 weeks ago. No home health. No skilled facility. No home oxygen. Uses no aids for ambulation. No falls. Fair appetite, but O.K. Prior to this admission. Takes care of all basic and instrumental activities of daily living herself, drives. Prescriptions are filled at [facility] on [address], Sister will transport. Shelbie Ammons RN MSN CCM Care Management [phone]

## 2015-12-25 NOTE — Care Management Important Message (Signed)
Important Message  Patient Details  Name: Rachel [name] MRN: [id number] Date of Birth: Mar 30, 1946   Medicare Important Message Given:  Yes    Shelbie Ammons, RN 12/25/2015, 12:40 PM

## 2015-12-25 NOTE — Progress Notes (Signed)
Pharmacy Antibiotic Note  Rachel [name] is a 70 y.o. female admitted on 12/24/2015 with cellulitis.  Pharmacy has been consulted for vancomycin and piperacillin/tazobactam dosing.  8/13: Piperacillin/tazobactam 3.375 g IV q8h EI  8/13: Vancomycin 1000 mg one time dose followed by 500 mg IV q12h (6 hour stacked dose)   Plan: Kinetics: Using adjusted body weight of 66 kg Ke: 0.061 Half-life: 11 hours Vd: 46 L  8/14: Will continue Piperacillin/tazobactam 3.375 g IV q8h EI  8/14: Will adjust vancomycin dose to Vancomycin 750 mg  IV q12h  Goal vancomycin trough 10-15 mcg/mL Vancomycin trough ordered for 8/16 @ 0030 Calculated trough as Css 13.5    Height: 5\' 4"  (162.6 cm) Weight: 182 lb 14.4 oz (83 kg) IBW/kg (Calculated) : 54.7  Temp (24hrs), Avg:98.4 F (36.9 C), Min:97.5 F (36.4 C), Max:98.9 F (37.2 C)   Recent Labs Lab 12/24/15 1359 12/24/15 1513 12/25/15 0440  WBC 12.1*  --  19.8*  CREATININE 0.86  --  0.80  LATICACIDVEN  --  0.8  --     Estimated Creatinine Clearance: 68.2 mL/min (by C-G formula based on SCr of 0.8 mg/dL).    No Known Allergies  Antimicrobials this admission: vancomycin 8/13 >>  Piperacillin/tazobactam 8/13 >>   Dose adjustments this admission:  Microbiology results:  Thank you for allowing pharmacy to be a part of this patient's care.  Pernell Dupre, PharmD Clinical Pharmacist 12/25/2015 8:30 AM

## 2015-12-25 NOTE — Progress Notes (Signed)
PT Cancellation Note  Patient Details Name: Rachel [name] MRN: [id number] DOB: 1945-05-17   Cancelled Treatment:    Reason Eval/Treat Not Completed: Other (comment) (Consult received and chart reviewed.  Patient currently eating lunch.  Will re-attempt at later time/date as patient available and medically appropriate.)   Hadar Elgersma H. Owens Shark, PT, DPT, NCS 12/25/15, 3:23 PM [phone]

## 2015-12-26 LAB — CBC
HEMATOCRIT: 34.5 % — AB (ref 35.0–47.0)
Hemoglobin: 11.8 g/dL — ABNORMAL LOW (ref 12.0–16.0)
MCH: 31.2 pg (ref 26.0–34.0)
MCHC: 34.2 g/dL (ref 32.0–36.0)
MCV: 91.2 fL (ref 80.0–100.0)
Platelets: 205 10*3/uL (ref 150–440)
RBC: 3.79 MIL/uL — AB (ref 3.80–5.20)
RDW: 13.1 % (ref 11.5–14.5)
WBC: 18.5 10*3/uL — AB (ref 3.6–11.0)

## 2015-12-26 NOTE — Evaluation (Signed)
Physical Therapy Evaluation Patient Details Name: Rachel [name] MRN: [id number] DOB: 1945/09/21 Today's Date: 12/26/2015   History of Present Illness  presented to ER secondary to R-sided facial swelling, tenderness x2-3 days; admitted with facial cellulitis, possible abscess near mandible.  Currently responding to PO antibiotics; no current need for surgical intervention (I&D) at this time.  Clinical Impression  Upon evaluation, patient alert and oriented; follows all commands and demonstrates good insight/safety awareness.  HOH; wears personal pocket-talker/speaker for voice augmentation.  Bilat UE/LE strength and ROM grossly symmetrical and WFL; no focal weakness appreciated.  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 Able to complete bed mobility indep; sit/stand, basic transfers and gait (400') without assist device, distant sup/mod indep.  Mild/mod trendelenburg to L LE, but patient with good use of compensatory strategies; no additional safety concerns/LOB noted.  Good cadence and gait speed apparent. No skilled PT needs noted at this time, as patient sup/mod indep with all functional mobility.  Will complete initial order; please re-consult should needs change.     Follow Up Recommendations No PT follow up    Equipment Recommendations       Recommendations for Other Services       Precautions / Restrictions Precautions Precautions: Fall Restrictions Weight Bearing Restrictions: No      Mobility  Bed Mobility Overal bed mobility: Independent                Transfers Overall transfer level: Modified independent Equipment used: None             General transfer comment: sit/stand from variety of seating surfaces (edge of bed, standard toilet), mod indep without assist device  Ambulation/Gait Ambulation/Gait assistance: Supervision;Modified independent (Device/Increase time) Ambulation Distance (Feet): 400  Feet Assistive device: None   Gait velocity: 10' walk time, 5-6 seconds   General Gait Details: mild/mod trendelenburg towards L (baseline L LE LLD due to previous orthopedic injury).  Well-compensated.  No additional safety concerns or LOB noted.  Stairs            Wheelchair Mobility    Modified Rankin (Stroke Patients Only)       Balance Overall balance assessment: Needs assistance Sitting-balance support: No upper extremity supported;Feet supported Sitting balance-Leahy Scale: Good     Standing balance support: No upper extremity supported Standing balance-Leahy Scale: Good                               Pertinent Vitals/Pain Pain Assessment: 0-10 Pain Score: 4  Pain Location: R cheek/jaw Pain Descriptors / Indicators: Aching Pain Intervention(s): Limited activity within patient's tolerance;Monitored during session;Repositioned    Home Living Family/patient expects to be discharged to:: Private residence Living Arrangements: Alone   Type of Home: House Home Access: Stairs to enter Entrance Stairs-Rails: Can reach both;Right;Left Entrance Stairs-Number of Steps: 4 Home Layout: One level        Prior Function Level of Independence: Independent         Comments: Indep with ADLs, household and community mobility; + driving.  Denies fall history.     Hand Dominance        Extremity/Trunk Assessment   Upper Extremity Assessment: Overall WFL for tasks assessed           Lower Extremity Assessment: Overall WFL for tasks assessed (grossly at least 4+/5 throughout)         Communication  Communication: HOH (cochlear implant with personal pocket-talker/speaker)  Cognition Arousal/Alertness: Awake/alert Behavior During Therapy: WFL for tasks assessed/performed Overall Cognitive Status: Within Functional Limits for tasks assessed                      General Comments      Exercises        Assessment/Plan    PT  Assessment Patent does not need any further PT services  PT Diagnosis     PT Problem List    PT Treatment Interventions     PT Goals (Current goals can be found in the Care Plan section) Acute Rehab PT Goals PT Goal Formulation: All assessment and education complete, DC therapy    Frequency     Barriers to discharge        Co-evaluation               End of Session Equipment Utilized During Treatment: Gait belt Activity Tolerance: Patient tolerated treatment well Patient left: with call bell/phone within reach;in bed;with bed alarm set Nurse Communication:  (requested hearing impaired phone for patient Control and instrumentation engineer to call patient experience))         Time: 0902-0920 PT Time Calculation (min) (ACUTE ONLY): 18 min   Charges:   PT Evaluation $PT Eval Low Complexity: 1 Procedure     PT G Codes:         Kaydence Menard H. Owens Shark, PT, DPT, NCS 12/26/15, 9:28 AM [phone]

## 2015-12-26 NOTE — Progress Notes (Signed)
..   12/26/2015 7:56 AM  Tharon Aquas [id number]  Hospital Day 3    Temp:  [97.6 F (36.4 C)-98.3 F (36.8 C)] 98.3 F (36.8 C) (08/15 0423) Pulse Rate:  [59-96] 59 (08/15 0423) Resp:  [18] 18 (08/14 2110) BP: (117-152)/(54-68) 117/54 (08/15 0423) SpO2:  [90 %-94 %] 90 % (08/15 0423),     Intake/Output Summary (Last 24 hours) at 12/26/15 0756 Last data filed at 12/26/15 0513  Gross per 24 hour  Intake             3134 ml  Output                0 ml  Net             3134 ml    No results found for this or any previous visit (from the past 24 hour(s)).  SUBJECTIVE:  No acute events.  Was NPO last night in case of procedure.  Reports improved pain.  Afebrile.    OBJECTIVE:   GEN- resting comfortably but easily aroused EARS- cochlear implant processor in place NOSE- clear anteriorly OC/OP- improved edema and induration of right cheek mucosa.  No fluctuance. FACE-  Continued edema and induration but significantly softer and less erythema.  No fluctuance.  Improved submental induration and tenderness  IMPRESSION:  Facial cellulitis/small abscess  PLAN:  Staph on blood culture.  On Vanc and Zosyn.  Continues to improve on IV abx and cleared for PO as no need for surgical intervention at this time given significant improvement over last 24 hours.    Okey Zelek 12/26/2015, 7:56 AM

## 2015-12-26 NOTE — Progress Notes (Signed)
[place] at [place] NAME: Rachel [name]    MR#:  [id number]  DATE OF BIRTH:  07-15-1945  SUBJECTIVE:   Doing well. Swelling much improved REVIEW OF SYSTEMS:   Review of Systems  Constitutional: Negative for chills, fever and weight loss.  HENT: Negative for ear discharge, ear pain and nosebleeds.   Eyes: Negative for blurred vision, pain and discharge.  Respiratory: Negative for sputum production, shortness of breath, wheezing and stridor.   Cardiovascular: Negative for chest pain, palpitations, orthopnea and PND.  Gastrointestinal: Negative for abdominal pain, diarrhea, nausea and vomiting.  Genitourinary: Negative for frequency and urgency.  Musculoskeletal: Negative for back pain and joint pain.  Neurological: Negative for sensory change, speech change, focal weakness and weakness.  Psychiatric/Behavioral: Negative for depression and hallucinations. The patient is not nervous/anxious.    Tolerating Diet:soft diet Tolerating PT: no PT f/u  DRUG ALLERGIES:  No Known Allergies  VITALS:  Blood pressure 132/63, pulse 70, temperature 98.1 F (36.7 C), temperature source Oral, resp. rate 17, height 5\' 4"  (1.626 m), weight 182 lb 14.4 oz (83 kg), SpO2 94 %.  PHYSICAL EXAMINATION:   Physical Exam  GENERAL:  70 y.o.-year-old patient lying in the bed with no acute distress.  EYES: Pupils equal, round, reactive to light and accommodation. No scleral icterus. Extraocular muscles intact.  HEENT: Head atraumatic, normocephalic. Oropharynx and nasopharynx clear. swellig of the right cheek, soft, decreased tenderness NECK:  Supple, no jugular venous distention. No thyroid enlargement, no tenderness.  LUNGS: Normal breath sounds bilaterally, no wheezing, rales, rhonchi. No use of accessory muscles of respiration.  CARDIOVASCULAR: S1, S2 normal. No murmurs, rubs, or gallops.  ABDOMEN: Soft, nontender, nondistended. Bowel sounds  present. No organomegaly or mass.  EXTREMITIES: No cyanosis, clubbing or edema b/l.    NEUROLOGIC: Cranial nerves II through XII are intact. No focal Motor or sensory deficits b/l.   PSYCHIATRIC:  patient is alert and oriented x 3.  SKIN: No obvious rash, lesion, or ulcer.   LABORATORY PANEL:  CBC  Recent Labs Lab 12/26/15 0839  WBC 18.5*  HGB 11.8*  HCT 34.5*  PLT 205    Chemistries   Recent Labs Lab 12/24/15 1359 12/25/15 0440  NA 135 136  K 3.9 4.4  CL 103 103  CO2 23 26  GLUCOSE 117* 193*  BUN 26* 22*  CREATININE 0.86 0.80  CALCIUM 9.3 8.9  AST 34  --   ALT 24  --   ALKPHOS 89  --   BILITOT 0.9  --    Cardiac Enzymes No results for input(s): TROPONINI in the last 168 hours. RADIOLOGY:  Ct Soft Tissue Neck W Contrast  Result Date: 12/24/2015 CLINICAL DATA:  Right submandibular pain and swelling. EXAM: CT NECK WITH CONTRAST TECHNIQUE: Multidetector CT imaging of the neck was performed using the standard protocol following the bolus administration of intravenous contrast. CONTRAST:  85mL ISOVUE-300 IOPAMIDOL (ISOVUE-300) INJECTION 61% COMPARISON:  None. FINDINGS: Pharynx and larynx: Extensive soft tissue swelling in the right face overlying the maxilla and mandible. Obvious deformity. There is edema throughout the subcutaneous tissues. There is edema in the right masseter muscle. Deep to the masseter muscle and between the masseter and posterior body of the mandible, there is a large fluid collection measuring 8 x 19 mm on axial scans. This is most consistent with abscess given the history. No osteomyelitis. Patient is edentulous and no dental abscess identified Normal pharyngeal soft tissues. No mass.  Airway is normal. Larynx normal. Epiglottis normal. Salivary glands: Parotid and submandibular glands normal bilaterally. Thyroid: Normal Lymph nodes: Small cervical lymph nodes. No pathologic adenopathy in the neck. Vascular: Negative. Limited intracranial: Negative  Visualized orbits: Negative Mastoids and visualized paranasal sinuses: Paranasal sinuses clear. Right cochlear implant. Skeleton: Cervical disc degeneration and spondylosis. Facet degeneration. 3 mm anterior slip C4-5. Upper chest: Negative IMPRESSION: Extensive soft tissue swelling in the right face. 8 x 19 mm fluid collection between the masseter muscle and posterior body of the mandible on the right compatible with abscess. No osteomyelitis. Patient is edentulous and no dental abscess identified. Electronically Signed   By: Franchot Gallo M.D.   On: 12/24/2015 16:01   ASSESSMENT AND PLAN:  Rachel [name]  is a 70 y.o. female with a known history of Hypertension, hyperlipidemia, hearing loss presents to the hospital secondary to worsening right facial swelling and tenderness for 2 days now.  #1 right facial cellulitis with possible abscess near the mandible-no osteomyelitis noted.  -ENT consult apprciated .No  Need for  I and D at present - broad-spectrum antibiotics with vancomycin and Zosyn - IV decadron, IV abxs -prn pain medications -BC 1/2 staph aureus ID pending  #2 hypertension-continue home medications -On oral Cardizem and hydralazine  #3 hyperlipidemia-on statin  #4 DVT prophylaxis-Lovenox  Overall improving Will d/c in am if cont to show improvement.  Case discussed with Care Management/Social Worker. Management plans discussed with the patient, family and they are in agreement.  CODE STATUS: full  DVT Prophylaxis: lovenox  TOTAL TIME TAKING CARE OF THIS PATIENT: 40 minutes.  >50% time spent on counselling and coordination of care  POSSIBLE D/C IN 1DAYS, DEPENDING ON CLINICAL CONDITION.  Note: This dictation was prepared with Dragon dictation along with smaller phrase technology. Any transcriptional errors that result from this process are unintentional.  Ahan Eisenberger M.D on 12/26/2015 at 11:42 AM  Between 7am to 6pm - Pager - [phone]  After 6pm go to  [email or web address] - password [email or web address]  Office  [phone]  CC: Primary care physician; Marden Noble, MD

## 2015-12-27 MED ORDER — AMOXICILLIN-POT CLAVULANATE 875-125 MG PO TABS
1.0000 | ORAL_TABLET | Freq: Two times a day (BID) | ORAL | Status: DC
  Administered 2015-12-27: 1 via ORAL
  Filled 2015-12-27: qty 1

## 2015-12-27 MED ORDER — AMOXICILLIN-POT CLAVULANATE 875-125 MG PO TABS: 1.0000 | ORAL_TABLET | Freq: Two times a day (BID) | ORAL | 0 refills | Status: DC

## 2015-12-27 MED ORDER — PREDNISONE 10 MG PO TABS: ORAL_TABLET | ORAL | 0 refills | Status: DC

## 2015-12-27 MED ORDER — HYDROCODONE-ACETAMINOPHEN 5-325 MG PO TABS: 1.0000 | ORAL_TABLET | ORAL | 0 refills | Status: DC | PRN

## 2015-12-27 MED ORDER — PREDNISONE 50 MG PO TABS
50.0000 mg | ORAL_TABLET | Freq: Every day | ORAL | Status: DC
  Administered 2015-12-27: 50 mg via ORAL
  Filled 2015-12-27: qty 1
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
## 2015-12-28 LAB — CULTURE, BLOOD (ROUTINE X 2)

## 2015-12-29 LAB — CULTURE, BLOOD (ROUTINE X 2): CULTURE: NO GROWTH

## 2016-02-08 ENCOUNTER — Ambulatory Visit: Payer: Self-pay | Admitting: Psychiatry

## 2016-02-08 ENCOUNTER — Ambulatory Visit (INDEPENDENT_AMBULATORY_CARE_PROVIDER_SITE_OTHER): Payer: Medicare Other | Admitting: Psychiatry

## 2016-02-08 ENCOUNTER — Encounter: Payer: Self-pay | Admitting: Psychiatry

## 2016-02-08 VITALS — BP 144/79 | HR 117 | Temp 98.4°F | Wt 177.9 lb

## 2016-02-08 DIAGNOSIS — F1020 Alcohol dependence, uncomplicated: Secondary | ICD-10-CM

## 2016-02-08 DIAGNOSIS — F331 Major depressive disorder, recurrent, moderate: Secondary | ICD-10-CM

## 2016-02-08 MED ORDER — BREXPIPRAZOLE 0.5 MG PO TABS: 0.5000 mg | ORAL_TABLET | ORAL | 0 refills | Status: DC

## 2016-02-08 MED ORDER — VENLAFAXINE HCL 75 MG PO TABS: 75.0000 mg | ORAL_TABLET | ORAL | 1 refills | Status: DC

## 2016-02-08 MED ORDER — ESCITALOPRAM OXALATE 10 MG PO TABS: 10.0000 mg | ORAL_TABLET | Freq: Every day | ORAL | 1 refills | Status: DC
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
## 2016-02-12 ENCOUNTER — Other Ambulatory Visit: Payer: Self-pay | Admitting: Psychiatry

## 2016-02-12 MED ORDER — VENLAFAXINE HCL 75 MG PO TABS: 75.0000 mg | ORAL_TABLET | ORAL | 1 refills | Status: DC

## 2016-02-22 ENCOUNTER — Encounter: Payer: Self-pay | Admitting: Psychiatry

## 2016-02-22 ENCOUNTER — Ambulatory Visit (INDEPENDENT_AMBULATORY_CARE_PROVIDER_SITE_OTHER): Payer: Medicare Other | Admitting: Psychiatry

## 2016-02-22 VITALS — BP 164/72 | HR 96 | Temp 98.0°F | Wt 181.6 lb

## 2016-02-22 DIAGNOSIS — F331 Major depressive disorder, recurrent, moderate: Secondary | ICD-10-CM

## 2016-02-22 DIAGNOSIS — F1020 Alcohol dependence, uncomplicated: Secondary | ICD-10-CM | POA: Diagnosis not present

## 2016-02-22 MED ORDER — BREXPIPRAZOLE 1 MG PO TABS: 1.0000 mg | ORAL_TABLET | Freq: Every morning | ORAL | 0 refills | Status: DC

## 2016-02-22 MED ORDER — ZOLPIDEM TARTRATE 10 MG PO TABS: 10.0000 mg | ORAL_TABLET | Freq: Every day | ORAL | 0 refills | Status: DC

## 2016-02-22 MED ORDER — VENLAFAXINE HCL 37.5 MG PO TABS: 37.5000 mg | ORAL_TABLET | ORAL | 0 refills | Status: DC

## 2016-02-22 MED ORDER — ESCITALOPRAM OXALATE 10 MG PO TABS: 10.0000 mg | ORAL_TABLET | Freq: Every day | ORAL | 1 refills | Status: DC
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
## 2016-02-23 ENCOUNTER — Telehealth: Payer: Self-pay

## 2016-02-23 NOTE — Telephone Encounter (Signed)
pt states she can not afford the rexulti.  I will send patient a form to fill out but she states that she doubts she quifiy.  pt states it was going to cost her $475.00

## 2016-02-27 ENCOUNTER — Other Ambulatory Visit: Payer: Self-pay | Admitting: Psychiatry

## 2016-02-27 MED ORDER — ESCITALOPRAM OXALATE 10 MG PO TABS: 10.0000 mg | ORAL_TABLET | Freq: Two times a day (BID) | ORAL | 1 refills | Status: DC

## 2016-04-29 ENCOUNTER — Telehealth: Payer: Self-pay

## 2016-04-29 ENCOUNTER — Other Ambulatory Visit: Payer: Self-pay | Admitting: Psychiatry

## 2016-04-29 NOTE — Telephone Encounter (Signed)
called in refill for lexapro

## 2016-04-29 NOTE — Telephone Encounter (Signed)
pt called states that she needed a refill on her lexapro.  pt maded and appt for 05-31-16

## 2016-05-31 ENCOUNTER — Ambulatory Visit: Payer: Medicare Other | Admitting: Psychiatry

## 2016-07-11 ENCOUNTER — Other Ambulatory Visit: Payer: Self-pay | Admitting: Psychiatry

## 2017-12-09 DIAGNOSIS — R195 Other fecal abnormalities: Secondary | ICD-10-CM

## 2017-12-09 HISTORY — DX: Other fecal abnormalities: R19.5

## 2017-12-10 ENCOUNTER — Ambulatory Visit: Payer: Medicare Other | Admitting: Cardiovascular Disease

## 2017-12-10 ENCOUNTER — Encounter: Payer: Self-pay | Admitting: Cardiovascular Disease

## 2017-12-10 VITALS — BP 125/76 | HR 89 | Ht 66.0 in | Wt 202.0 lb

## 2017-12-10 DIAGNOSIS — E785 Hyperlipidemia, unspecified: Secondary | ICD-10-CM | POA: Insufficient documentation

## 2017-12-10 DIAGNOSIS — M25551 Pain in right hip: Secondary | ICD-10-CM

## 2017-12-10 DIAGNOSIS — M25552 Pain in left hip: Secondary | ICD-10-CM

## 2017-12-10 DIAGNOSIS — I10 Essential (primary) hypertension: Secondary | ICD-10-CM | POA: Diagnosis not present

## 2017-12-10 DIAGNOSIS — H9190 Unspecified hearing loss, unspecified ear: Secondary | ICD-10-CM | POA: Insufficient documentation

## 2017-12-10 DIAGNOSIS — R0602 Shortness of breath: Secondary | ICD-10-CM

## 2017-12-10 DIAGNOSIS — F17200 Nicotine dependence, unspecified, uncomplicated: Secondary | ICD-10-CM | POA: Diagnosis not present

## 2017-12-10 DIAGNOSIS — E782 Mixed hyperlipidemia: Secondary | ICD-10-CM

## 2017-12-10 DIAGNOSIS — H9193 Unspecified hearing loss, bilateral: Secondary | ICD-10-CM

## 2017-12-10 NOTE — Patient Instructions (Addendum)
Medication Instructions:   No medication changes made  Labwork:  No new labs needed  Testing/Procedures:  We will order an echocardiogram for shortness of breath and murmur  We will also order a CT screening lung study given your hx of smoking, Looking for reasons for shortness of breath   January 02, 2018 arrive at 10:00AM at the [facility] Entrance  At a later date, we might need PFTs Pulmonary function tests  Follow-Up: It was a pleasure seeing you in the office today. Please call us if you have new issues that need to be addressed before your next appt.  [phone]  Your physician wants you to follow-up in:  As needed  You have been referred to Pulmonary for further evaluation of shortness of breath.    If you need a refill on your cardiac medications before your next appointment, please call your pharmacy.  For educational health videos Log in to : [email or web address] Or : [email or web address], password : [email or web address]
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
## 2017-12-11 ENCOUNTER — Telehealth: Payer: Self-pay | Admitting: *Deleted

## 2017-12-11 NOTE — Telephone Encounter (Signed)
error 

## 2017-12-19 ENCOUNTER — Other Ambulatory Visit: Payer: Self-pay

## 2017-12-19 ENCOUNTER — Ambulatory Visit (INDEPENDENT_AMBULATORY_CARE_PROVIDER_SITE_OTHER): Payer: Medicare Other

## 2017-12-19 ENCOUNTER — Telehealth: Payer: Self-pay | Admitting: Cardiovascular Disease

## 2017-12-19 DIAGNOSIS — R0602 Shortness of breath: Secondary | ICD-10-CM

## 2017-12-19 NOTE — Telephone Encounter (Signed)
Hey Pam,  I was a little bit confused.  This was on her AVS at her last office visit:   We will also order a CT screening lung study given your hx of smoking, Looking for reasons for shortness of breath   January 02, 2018 arrive at 10:00AM at the [facility]  At a later date, we might need PFTs Pulmonary function tests    What is the 01/02/18 date for- there is nothing scheduled in Epic, but is that for a CT of the lungs?

## 2017-12-19 NOTE — Telephone Encounter (Signed)
Patient wants to know about getting ct lung scan Dr. Rockey Situ mentioned in ov .  Patient AVS says ct lung screen but there is no order .  Patient aware someone will call to schedule.

## 2017-12-20 LAB — COLOGUARD

## 2017-12-22 ENCOUNTER — Telehealth: Payer: Self-pay | Admitting: *Deleted

## 2017-12-22 DIAGNOSIS — Z122 Encounter for screening for malignant neoplasm of respiratory organs: Secondary | ICD-10-CM

## 2017-12-22 DIAGNOSIS — Z87891 Personal history of nicotine dependence: Secondary | ICD-10-CM

## 2017-12-22 NOTE — Telephone Encounter (Signed)
This was scheduled to be done over at the cancer center and they called and confirmed with her today.

## 2017-12-22 NOTE — Telephone Encounter (Signed)
Received referral for initial lung cancer screening scan. Contacted patient and obtained smoking history,(former, quit 2017, 40 pack year) as well as answering questions related to screening process. Patient denies signs of lung cancer such as weight loss or hemoptysis. Patient denies comorbidity that would prevent curative treatment if lung cancer were found. Patient is scheduled for shared decision making visit and CT scan on 01/02/18.

## 2017-12-31 ENCOUNTER — Encounter: Payer: Self-pay | Admitting: General Surgery

## 2018-01-02 ENCOUNTER — Ambulatory Visit
Admission: RE | Admit: 2018-01-02 | Discharge: 2018-01-02 | Disposition: A | Discharge status: Home / Self Care | Payer: Medicare Other | Source: Ambulatory Visit | Attending: Nurse Practitioner | Admitting: Nurse Practitioner

## 2018-01-02 ENCOUNTER — Encounter: Payer: Self-pay | Admitting: Nurse Practitioner

## 2018-01-02 ENCOUNTER — Inpatient Hospital Stay: Payer: Medicare Other | Attending: Nurse Practitioner | Admitting: Nurse Practitioner

## 2018-01-02 DIAGNOSIS — J439 Emphysema, unspecified: Secondary | ICD-10-CM | POA: Insufficient documentation

## 2018-01-02 DIAGNOSIS — I700 Atherosclerosis of aorta: Secondary | ICD-10-CM | POA: Insufficient documentation

## 2018-01-02 DIAGNOSIS — Z87891 Personal history of nicotine dependence: Secondary | ICD-10-CM

## 2018-01-02 DIAGNOSIS — Z122 Encounter for screening for malignant neoplasm of respiratory organs: Secondary | ICD-10-CM | POA: Diagnosis not present

## 2018-01-02 NOTE — Progress Notes (Signed)
In accordance with CMS guidelines, patient has met eligibility criteria including age, absence of signs or symptoms of lung cancer.  Social History   Tobacco Use  . Smoking status: Former Smoker    Packs/day: 1.00    Years: 40.00    Pack years: 40.00    Types: Cigarettes    Last attempt to quit: 2017    Years since quitting: 2.6  . Smokeless tobacco: Never Used  Substance Use Topics  . Alcohol use: No  . Drug use: No      A shared decision-making session was conducted prior to the performance of CT scan. This includes one or more decision aids, includes benefits and harms of screening, follow-up diagnostic testing, over-diagnosis, false positive rate, and total radiation exposure.   Counseling on the importance of adherence to annual lung cancer LDCT screening, impact of co-morbidities, and ability or willingness to undergo diagnosis and treatment is imperative for compliance of the program.   Counseling on the importance of continued smoking cessation for former smokers; the importance of smoking cessation for current smokers, and information about tobacco cessation interventions have been given to patient including [place] and [facility] quit [place] programs.   Written order for lung cancer screening with LDCT has been given to the patient and any and all questions have been answered to the best of my abilities.    Yearly follow up will be coordinated by Burgess Estelle, Thoracic Navigator.  Beckey Rutter, DNP, AGNP-C [place] [facility] [phone] (work cell) [phone] (office) 01/02/18 11:34 AM

## 2018-01-06 ENCOUNTER — Encounter: Payer: Self-pay | Admitting: *Deleted

## 2018-01-09 ENCOUNTER — Ambulatory Visit: Payer: Medicare Other | Admitting: Pulmonary Disease

## 2018-01-09 ENCOUNTER — Encounter: Payer: Self-pay | Admitting: Pulmonary Disease

## 2018-01-09 VITALS — BP 185/96 | HR 95 | Resp 16 | Ht 66.0 in | Wt 204.0 lb

## 2018-01-09 DIAGNOSIS — R0609 Other forms of dyspnea: Secondary | ICD-10-CM | POA: Diagnosis not present

## 2018-01-09 DIAGNOSIS — Z87891 Personal history of nicotine dependence: Secondary | ICD-10-CM | POA: Diagnosis not present

## 2018-01-09 DIAGNOSIS — E668 Other obesity: Secondary | ICD-10-CM | POA: Diagnosis not present

## 2018-01-09 DIAGNOSIS — J439 Emphysema, unspecified: Secondary | ICD-10-CM | POA: Diagnosis not present

## 2018-01-09 MED ORDER — UMECLIDINIUM-VILANTEROL 62.5-25 MCG/INH IN AEPB: 1.0000 | INHALATION_SPRAY | Freq: Every day | RESPIRATORY_TRACT | 5 refills | Status: DC

## 2018-01-09 NOTE — Patient Instructions (Addendum)
Anoro inhaler.  1 inhalation daily.   Sample provided.  Prescription entered.  Do not purchase the prescription unless you are certain that it is helping your breathing.  If it is extremely expensive, contact our office and we will help you seek financial assistance for this medication or find an alternative medication  Lung function tests (PFTs) ordered to be performed prior to next office visit  Follow-up in 5 to 6 weeks.  Call sooner if needed
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
## 2018-01-13 ENCOUNTER — Encounter: Payer: Self-pay | Admitting: General Surgery

## 2018-01-13 ENCOUNTER — Other Ambulatory Visit
Admission: RE | Admit: 2018-01-13 | Discharge: 2018-01-13 | Disposition: A | Discharge status: Home / Self Care | Payer: Medicare Other | Source: Ambulatory Visit | Attending: General Surgery | Admitting: General Surgery

## 2018-01-13 ENCOUNTER — Ambulatory Visit: Payer: Medicare Other | Admitting: General Surgery

## 2018-01-13 VITALS — BP 144/72 | HR 91 | Resp 20 | Ht 66.0 in | Wt 205.0 lb

## 2018-01-13 DIAGNOSIS — R0602 Shortness of breath: Secondary | ICD-10-CM

## 2018-01-13 DIAGNOSIS — R195 Other fecal abnormalities: Secondary | ICD-10-CM

## 2018-01-13 LAB — CBC WITH DIFFERENTIAL/PLATELET
BASOS PCT: 1 %
Basophils Absolute: 0 10*3/uL (ref 0–0.1)
EOS ABS: 0.2 10*3/uL (ref 0–0.7)
Eosinophils Relative: 3 %
HCT: 38.7 % (ref 35.0–47.0)
HEMOGLOBIN: 13.3 g/dL (ref 12.0–16.0)
Lymphocytes Relative: 12 %
Lymphs Abs: 0.8 10*3/uL — ABNORMAL LOW (ref 1.0–3.6)
MCH: 32.3 pg (ref 26.0–34.0)
MCHC: 34.3 g/dL (ref 32.0–36.0)
MCV: 94 fL (ref 80.0–100.0)
Monocytes Absolute: 0.6 10*3/uL (ref 0.2–0.9)
Monocytes Relative: 9 %
NEUTROS PCT: 77 %
Neutro Abs: 5.3 10*3/uL (ref 1.4–6.5)
Platelets: 207 10*3/uL (ref 150–440)
RBC: 4.12 MIL/uL (ref 3.80–5.20)
RDW: 13.9 % (ref 11.5–14.5)
WBC: 7 10*3/uL (ref 3.6–11.0)
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
## 2018-01-13 NOTE — Patient Instructions (Addendum)
May apply neosporin to left lower leg abrasion.   Colonoscopy, Adult A colonoscopy is an exam to look at the entire large intestine. During the exam, a lubricated, bendable tube is inserted into the anus and then passed into the rectum, colon, and other parts of the large intestine. A colonoscopy is often done as a part of normal colorectal screening or in response to certain symptoms, such as anemia, persistent diarrhea, abdominal pain, and blood in the stool. The exam can help screen for and diagnose medical problems, including:  Tumors.  Polyps.  Inflammation.  Areas of bleeding.  Tell a health care provider about:  Any allergies you have.  All medicines you are taking, including vitamins, herbs, eye drops, creams, and over-the-counter medicines.  Any problems you or family members have had with anesthetic medicines.  Any blood disorders you have.  Any surgeries you have had.  Any medical conditions you have.  Any problems you have had passing stool. What are the risks? Generally, this is a safe procedure. However, problems may occur, including:  Bleeding.  A tear in the intestine.  A reaction to medicines given during the exam.  Infection (rare).  What happens before the procedure? Eating and drinking restrictions Follow instructions from your health care provider about eating and drinking, which may include:  A few days before the procedure - follow a low-fiber diet. Avoid nuts, seeds, dried fruit, raw fruits, and vegetables.  1-3 days before the procedure - follow a clear liquid diet. Drink only clear liquids, such as clear broth or bouillon, black coffee or tea, clear juice, clear soft drinks or sports drinks, gelatin dessert, and popsicles. Avoid any liquids that contain red or purple dye.  On the day of the procedure - do not eat or drink anything during the 2 hours before the procedure, or within the time period that your health care provider  recommends.  Bowel prep If you were prescribed an oral bowel prep to clean out your colon:  Take it as told by your health care provider. Starting the day before your procedure, you will need to drink a large amount of medicated liquid. The liquid will cause you to have multiple loose stools until your stool is almost clear or light green.  If your skin or anus gets irritated from diarrhea, you may use these to relieve the irritation: ? Medicated wipes, such as adult wet wipes with aloe and vitamin E. ? A skin soothing-product like petroleum jelly.  If you vomit while drinking the bowel prep, take a break for up to 60 minutes and then begin the bowel prep again. If vomiting continues and you cannot take the bowel prep without vomiting, call your health care provider.  General instructions  Ask your health care provider about changing or stopping your regular medicines. This is especially important if you are taking diabetes medicines or blood thinners.  Plan to have someone take you home from the hospital or clinic. What happens during the procedure?  An IV tube may be inserted into one of your veins.  You will be given medicine to help you relax (sedative).  To reduce your risk of infection: ? Your health care team will wash or sanitize their hands. ? Your anal area will be washed with soap.  You will be asked to lie on your side with your knees bent.  Your health care provider will lubricate a long, thin, flexible tube. The tube will have a camera and a light on  the end.  The tube will be inserted into your anus.  The tube will be gently eased through your rectum and colon.  Air will be delivered into your colon to keep it open. You may feel some pressure or cramping.  The camera will be used to take images during the procedure.  A small tissue sample may be removed from your body to be examined under a microscope (biopsy). If any potential problems are found, the tissue  will be sent to a lab for testing.  If small polyps are found, your health care provider may remove them and have them checked for cancer cells.  The tube that was inserted into your anus will be slowly removed. The procedure may vary among health care providers and hospitals. What happens after the procedure?  Your blood pressure, heart rate, breathing rate, and blood oxygen level will be monitored until the medicines you were given have worn off.  Do not drive for 24 hours after the exam.  You may have a small amount of blood in your stool.  You may pass gas and have mild abdominal cramping or bloating due to the air that was used to inflate your colon during the exam.  It is up to you to get the results of your procedure. Ask your health care provider, or the department performing the procedure, when your results will be ready. This information is not intended to replace advice given to you by your health care provider. Make sure you discuss any questions you have with your health care provider. Document Released: 04/26/2000 Document Revised: 02/28/2016 Document Reviewed: 07/11/2015 Elsevier Interactive Patient Education  2018 [employer].

## 2018-01-14 ENCOUNTER — Telehealth: Payer: Self-pay | Admitting: Pulmonary Disease

## 2018-01-14 ENCOUNTER — Other Ambulatory Visit: Payer: Self-pay | Admitting: General Surgery

## 2018-01-14 ENCOUNTER — Telehealth: Payer: Self-pay

## 2018-01-14 DIAGNOSIS — R195 Other fecal abnormalities: Secondary | ICD-10-CM

## 2018-01-14 MED ORDER — POLYETHYLENE GLYCOL 3350 17 GM/SCOOP PO POWD: 1.0000 | Freq: Once | ORAL | 0 refills | Status: AC

## 2018-01-14 NOTE — Telephone Encounter (Signed)
Informed pt to call her insurance company and ask for alternatives for Anoro and then call us back with them. Pt verbalized understanding. Nothing further needed.

## 2018-01-14 NOTE — Telephone Encounter (Signed)
The patient is scheduled for a Colonoscopy at [facility] on 01/28/18. They are aware to call the day before to get their arrival time. She will only take her Cardizem and Hydralazine the morning of at 6 am with a sip of water. Miralax prescription has been sent into the patient's pharmacy. The patient is aware of date and instructions.

## 2018-01-14 NOTE — Telephone Encounter (Signed)
Message left for patient to call back to schedule her colonoscopy.

## 2018-01-14 NOTE — Telephone Encounter (Signed)
Pt states her inhaler Anoro, is too expensive, pt would like an alternative.

## 2018-01-28 ENCOUNTER — Ambulatory Visit
Admission: RE | Admit: 2018-01-28 | Discharge: 2018-01-28 | Disposition: A | Discharge status: Home / Self Care | Payer: Medicare Other | Source: Ambulatory Visit | Attending: General Surgery | Admitting: General Surgery

## 2018-01-28 ENCOUNTER — Encounter: Payer: Self-pay | Admitting: Anesthesiology

## 2018-01-28 ENCOUNTER — Encounter
Admission: RE | Disposition: A | Discharge status: Home / Self Care | Payer: Self-pay | Source: Ambulatory Visit | Attending: General Surgery

## 2018-01-28 ENCOUNTER — Ambulatory Visit: Payer: Medicare Other | Admitting: Anesthesiology

## 2018-01-28 DIAGNOSIS — K635 Polyp of colon: Secondary | ICD-10-CM | POA: Insufficient documentation

## 2018-01-28 DIAGNOSIS — K621 Rectal polyp: Secondary | ICD-10-CM | POA: Diagnosis not present

## 2018-01-28 DIAGNOSIS — K5730 Diverticulosis of large intestine without perforation or abscess without bleeding: Secondary | ICD-10-CM | POA: Diagnosis not present

## 2018-01-28 DIAGNOSIS — Z87891 Personal history of nicotine dependence: Secondary | ICD-10-CM | POA: Insufficient documentation

## 2018-01-28 DIAGNOSIS — I10 Essential (primary) hypertension: Secondary | ICD-10-CM | POA: Insufficient documentation

## 2018-01-28 DIAGNOSIS — R195 Other fecal abnormalities: Secondary | ICD-10-CM

## 2018-01-28 DIAGNOSIS — Z79899 Other long term (current) drug therapy: Secondary | ICD-10-CM | POA: Diagnosis not present

## 2018-01-28 DIAGNOSIS — D120 Benign neoplasm of cecum: Secondary | ICD-10-CM | POA: Diagnosis not present

## 2018-01-28 DIAGNOSIS — F329 Major depressive disorder, single episode, unspecified: Secondary | ICD-10-CM | POA: Insufficient documentation

## 2018-01-28 DIAGNOSIS — E785 Hyperlipidemia, unspecified: Secondary | ICD-10-CM | POA: Insufficient documentation

## 2018-01-28 HISTORY — PX: COLONOSCOPY WITH PROPOFOL: SHX5780

## 2018-01-28 SURGERY — COLONOSCOPY WITH PROPOFOL
Anesthesia: General

## 2018-01-28 MED ORDER — SODIUM CHLORIDE 0.9 % IV SOLN
INTRAVENOUS | Status: DC
  Administered 2018-01-28: 09:00:00 via INTRAVENOUS

## 2018-01-28 MED ORDER — FENTANYL CITRATE (PF) 100 MCG/2ML IJ SOLN
INTRAMUSCULAR | Status: DC | PRN
  Administered 2018-01-28: 50 ug via INTRAVENOUS

## 2018-01-28 MED ORDER — PROPOFOL 10 MG/ML IV BOLUS
INTRAVENOUS | Status: DC | PRN
  Administered 2018-01-28: 70 mg via INTRAVENOUS
  Administered 2018-01-28: 30 mg via INTRAVENOUS

## 2018-01-28 MED ORDER — MIDAZOLAM HCL 5 MG/5ML IJ SOLN
INTRAMUSCULAR | Status: DC | PRN
  Administered 2018-01-28: 1 mg via INTRAVENOUS

## 2018-01-28 MED ORDER — PROPOFOL 500 MG/50ML IV EMUL
INTRAVENOUS | Status: DC | PRN
  Administered 2018-01-28: 100 ug/kg/min via INTRAVENOUS
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
## 2018-01-28 NOTE — Transfer of Care (Signed)
Immediate Anesthesia Transfer of Care Note  Patient: Rachel [name]  Procedure(s) Performed: COLONOSCOPY WITH PROPOFOL (N/A )  Patient Location: PACU  Anesthesia Type:General  Level of Consciousness: awake, alert , oriented and patient cooperative  Airway & Oxygen Therapy: Patient Spontanous Breathing and Patient connected to nasal cannula oxygen  Post-op Assessment: Report given to RN and Post -op Vital signs reviewed and stable  Post vital signs: Reviewed and stable  Last Vitals:  Vitals Value Taken Time  BP    Temp    Pulse 81 01/28/2018 10:06 AM  Resp 21 01/28/2018 10:06 AM  SpO2 91 % 01/28/2018 10:06 AM  Vitals shown include unvalidated device data.  Last Pain:  Vitals:   01/28/18 0900  TempSrc: Tympanic  PainSc: 0-No pain         Complications: No apparent anesthesia complications

## 2018-01-28 NOTE — Anesthesia Postprocedure Evaluation (Signed)
Anesthesia Post Note  Patient: Rachel [name]  Procedure(s) Performed: COLONOSCOPY WITH PROPOFOL (N/A )  Patient location during evaluation: Endoscopy Anesthesia Type: General Level of consciousness: awake and alert Pain management: pain level controlled Vital Signs Assessment: post-procedure vital signs reviewed and stable Respiratory status: spontaneous breathing, nonlabored ventilation, respiratory function stable and patient connected to nasal cannula oxygen Cardiovascular status: blood pressure returned to baseline and stable Postop Assessment: no apparent nausea or vomiting Anesthetic complications: no     Last Vitals:  Vitals:   01/28/18 1006 01/28/18 1046  BP:  (!) 140/95  Pulse:    Resp: 18 16  Temp: (!) 36.1 C   SpO2:  98%    Last Pain:  Vitals:   01/28/18 1046  TempSrc:   PainSc: 0-No pain                 Tristan Bramble S

## 2018-01-28 NOTE — Anesthesia Post-op Follow-up Note (Signed)
Anesthesia QCDR form completed.        

## 2018-01-28 NOTE — Op Note (Signed)
[facility] Gastroenterology Patient Name: Genna Casimir Procedure Date: 01/28/2018 9:05 AM MRN: [id number] Account #: [id number] Date of Birth: 02-14-1946 Admit Type: Outpatient Age: 72 Room: [facility] ENDO ROOM 1 Gender: Female Note Status: Finalized Procedure:            Colonoscopy Indications:          Positive Cologuard test Providers:            Robert Bellow, MD Medicines:            Monitored Anesthesia Care Complications:        No immediate complications. Procedure:            Pre-Anesthesia Assessment:                       - Prior to the procedure, a History and Physical was                        performed, and patient medications, allergies and                        sensitivities were reviewed. The patient's tolerance of                        previous anesthesia was reviewed.                       - The risks and benefits of the procedure and the                        sedation options and risks were discussed with the                        patient. All questions were answered and informed                        consent was obtained.                       After obtaining informed consent, the colonoscope was                        passed under direct vision. Throughout the procedure,                        the patient's blood pressure, pulse, and oxygen                        saturations were monitored continuously. The                        Colonoscope was introduced through the anus and                        advanced to the the cecum, identified by appendiceal                        orifice and ileocecal valve. The colonoscopy was                        performed without difficulty. The patient tolerated the  procedure well. The quality of the bowel preparation                        was good. Findings:      A 3 mm polyp was found in the cecum. The polyp was sessile. Biopsies       were taken with a cold forceps  for histology.      Two sessile polyps were found in the rectum. The polyps were 5 to 9 mm       in size. These were biopsied with a cold forceps for histology.      Many small-mouthed diverticula were found in the sigmoid colon.      The retroflexed view of the distal rectum and anal verge was normal and       showed no anal or rectal abnormalities. Impression:           - One 3 mm polyp in the cecum. Biopsied.                       - Two 5 to 9 mm polyps in the rectum. Biopsied.                       - Diverticulosis in the sigmoid colon.                       - The distal rectum and anal verge are normal on                        retroflexion view. Recommendation:       - Telephone endoscopist for pathology results in 1 week. Procedure Code(s):    --- Professional ---                       [phone], Colonoscopy, flexible; with biopsy, single or                        multiple Diagnosis Code(s):    --- Professional ---                       D12.0, Benign neoplasm of cecum                       K62.1, Rectal polyp                       R19.5, Other fecal abnormalities                       K57.30, Diverticulosis of large intestine without                        perforation or abscess without bleeding CPT copyright 2017 [employer]. All rights reserved. The codes documented in this report are preliminary and upon coder review may  be revised to meet current compliance requirements. Robert Bellow, MD 01/28/2018 10:06:04 AM This report has been signed electronically. Number of Addenda: 0 Note Initiated On: 01/28/2018 9:05 AM Scope Withdrawal Time: 0 hours 23 minutes 2 seconds  Total Procedure Duration: 0 hours 33 minutes 24 seconds       [facility]

## 2018-01-28 NOTE — H&P (Signed)
No change in clinical history or exam. Tolerated prep well. Breathing at baseline today. For colonoscopy secondary to positive Cologuard test.

## 2018-01-31 LAB — SURGICAL PATHOLOGY

## 2018-02-03 ENCOUNTER — Ambulatory Visit: Payer: Medicare Other | Attending: Pulmonary Disease

## 2018-02-03 DIAGNOSIS — R0609 Other forms of dyspnea: Secondary | ICD-10-CM

## 2018-02-03 DIAGNOSIS — Z87891 Personal history of nicotine dependence: Secondary | ICD-10-CM

## 2018-02-03 DIAGNOSIS — E668 Other obesity: Secondary | ICD-10-CM

## 2018-02-03 DIAGNOSIS — E669 Obesity, unspecified: Secondary | ICD-10-CM

## 2018-02-03 DIAGNOSIS — J439 Emphysema, unspecified: Secondary | ICD-10-CM | POA: Diagnosis not present

## 2018-02-03 MED ORDER — ALBUTEROL SULFATE (2.5 MG/3ML) 0.083% IN NEBU
2.5000 mg | INHALATION_SOLUTION | Freq: Once | RESPIRATORY_TRACT | Status: AC
  Administered 2018-02-03: 2.5 mg via RESPIRATORY_TRACT
  Filled 2018-02-03: qty 3

## 2018-02-09 ENCOUNTER — Telehealth: Payer: Self-pay | Admitting: General Surgery

## 2018-02-09 NOTE — Telephone Encounter (Signed)
Notified path was fine.  Hyperplastic polyps in the rectum. Crypt prominence in cecum.  No pathologic process.  Consider repeat colonoscopy in 10 years.

## 2018-02-13 ENCOUNTER — Encounter: Payer: Self-pay | Admitting: Pulmonary Disease

## 2018-02-13 ENCOUNTER — Ambulatory Visit: Payer: Medicare Other | Admitting: Pulmonary Disease

## 2018-02-13 VITALS — BP 138/60 | HR 99 | Resp 16 | Ht 66.0 in | Wt 207.0 lb

## 2018-02-13 DIAGNOSIS — Z87891 Personal history of nicotine dependence: Secondary | ICD-10-CM | POA: Diagnosis not present

## 2018-02-13 DIAGNOSIS — E669 Obesity, unspecified: Secondary | ICD-10-CM

## 2018-02-13 DIAGNOSIS — R0609 Other forms of dyspnea: Secondary | ICD-10-CM | POA: Diagnosis not present

## 2018-02-13 DIAGNOSIS — J449 Chronic obstructive pulmonary disease, unspecified: Secondary | ICD-10-CM | POA: Diagnosis not present

## 2018-02-13 MED ORDER — ALBUTEROL SULFATE HFA 108 (90 BASE) MCG/ACT IN AERS: 1.0000 | INHALATION_SPRAY | RESPIRATORY_TRACT | 10 refills | Status: DC | PRN

## 2018-02-13 NOTE — Patient Instructions (Signed)
Continue Anoro inhaler.  You may try on and off of it to determine whether it provides you any benefit in relieving shortness of breath  New prescription: Albuterol inhaler - 2 actuations up to every 6 hours as needed for increased shortness of breath, wheezing, chest tightness, cough  Follow-up in 4 to 6 months with target weight of 195 pounds at the time of follow-up

## 2018-02-13 NOTE — Progress Notes (Signed)
PULMONARY OFFICE FOLLOW-UP NOTE  Requesting MD/Service: Rockey Situ Date of initial consultation: 01/09/18 Reason for consultation: DOE  PT PROFILE: 72 y.o. female former smoker (1 PPD x 50 yrs, quit 2017) referred for evaluation of exertional dyspnea  DATA: 01/02/18 LDCT chest: Platelike scarring/atelectasis in the RUL/RML. Bronchiectasis with scarring in the BLL. Mild LUL atelectasis. Left apical pleural-parenchymal scarring, partially calcified. Clustered BLL nodules measuring up to 3.9 mm. Mild emphysematous changes in the bilateral lower lobes. Lung-RADS 2, benign appearance. Continue annual screening with low-dose chest CT without contrast in 12 months. Echocardiogram 12/19/17: normal LVEF, no valvular problems. RVSP could not be estimated PFTs 02/03/2018: FVC: 2.40 > 2.73 L (80 > 91 %pred), FEV1: 1.67 > 1.90 L (72 > 82 %pred), FEV1/FVC: 70%, TLC: 5.96 L (111 %pred), DLCO 45 %pred.  Flow volume curve consistent with mild obstruction.  Significant improvement in airflow after bronchodilator therapy.    INTERVAL:  SUBJ:  This is a scheduled office follow-up.  She is here to review results of PFTs.  Last visit, we initiated a trial of Anoro.  She is uncertain whether this has offered her any benefit with regard to her shortness of breath.  She has no rescue inhaler. She has no new complaints.  She denies CP, fever, purulent sputum, hemoptysis, LE edema and calf tenderness.   Vitals:   02/13/18 1131 02/13/18 1136  BP:  138/60  Pulse:  99  Resp: 16   SpO2:  91%  Weight: 207 lb (93.9 kg)   Height: 5\' 6"  (1.676 m)   Room air  EXAM:  Gen: Moderately obese, very HOH, NAD HEENT: NCAT, sclerae white Neck: No JVD noted Lungs: Breath sounds full without wheezes or other adventitious sounds Cardiovascular: Regular, no M Abdomen: Soft, NT, NABS Ext: No clubbing, cyanosis, edema Neuro: No focal neurologic deficits Skin: Limited exam, no lesions noted  DATA:   BMP Latest Ref Rng & Units  12/25/2015 12/24/2015 05/19/2011  Glucose 65 - 99 mg/dL 193(H) 117(H) 90  BUN 6 - 20 mg/dL 22(H) 26(H) 16  Creatinine 0.44 - 1.00 mg/dL 0.80 0.86 0.80  Sodium 135 - 145 mmol/L 136 135 143  Potassium 3.5 - 5.1 mmol/L 4.4 3.9 3.4(L)  Chloride 101 - 111 mmol/L 103 103 106  CO2 22 - 32 mmol/L 26 23 25   Calcium 8.9 - 10.3 mg/dL 8.9 9.3 9.1    CBC Latest Ref Rng & Units 01/13/2018 12/26/2015 12/25/2015  WBC 3.6 - 11.0 K/uL 7.0 18.5(H) 19.8(H)  Hemoglobin 12.0 - 16.0 g/dL 13.3 11.8(L) 12.8  Hematocrit 35.0 - 47.0 % 38.7 34.5(L) 36.4  Platelets 150 - 440 K/uL 207 205 188    CXR: No recent films  I have personally reviewed all chest radiographs reported above including CXRs and CT chest unless otherwise indicated  IMPRESSION:     ICD-10-CM   1. Former smoker Z87.891   2. 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 Mild obesity E66.9   4. DOE out of proportion to objective findings R06.09    Obesity is a likely contributor to exertional dyspnea.  PLAN:  Continue Anoro inhaler.  I instructed that she may try on and off it for 1 week at a time to discern whether it is beneficial in relieving her shortness of breath.  Continue albuterol inhaler, 2 actuations every 6 hours as needed.  We discussed that her weight might be a contributor to shortness of breath.  We talked about weight loss strategies with a focus on avoidance of processed  foods and simple sugars/carbohydrates.  Follow-up in 4 to 6 months with a target weight of 195 pounds at that time.   Merton Border, MD PCCM service Mobile [phone] Pager [phone] 02/18/2018 2:40 PM

## 2018-07-15 ENCOUNTER — Other Ambulatory Visit
Admission: RE | Admit: 2018-07-15 | Discharge: 2018-07-15 | Disposition: A | Discharge status: Home / Self Care | Payer: Medicare Other | Source: Ambulatory Visit | Attending: Student | Admitting: Student

## 2018-07-15 DIAGNOSIS — K529 Noninfective gastroenteritis and colitis, unspecified: Secondary | ICD-10-CM | POA: Insufficient documentation

## 2018-07-15 LAB — GASTROINTESTINAL PANEL BY PCR, STOOL (REPLACES STOOL CULTURE)
ASTROVIRUS: NOT DETECTED
Adenovirus F40/41: NOT DETECTED
Campylobacter species: NOT DETECTED
Cryptosporidium: NOT DETECTED
Cyclospora cayetanensis: NOT DETECTED
ENTAMOEBA HISTOLYTICA: NOT DETECTED
ENTEROAGGREGATIVE E COLI (EAEC): NOT DETECTED
Enteropathogenic E coli (EPEC): NOT DETECTED
Enterotoxigenic E coli (ETEC): NOT DETECTED
GIARDIA LAMBLIA: NOT DETECTED
NOROVIRUS GI/GII: NOT DETECTED
Plesimonas shigelloides: NOT DETECTED
ROTAVIRUS A: NOT DETECTED
SHIGELLA/ENTEROINVASIVE E COLI (EIEC): NOT DETECTED
Salmonella species: NOT DETECTED
Sapovirus (I, II, IV, and V): NOT DETECTED
Shiga like toxin producing E coli (STEC): NOT DETECTED
VIBRIO CHOLERAE: NOT DETECTED
Vibrio species: NOT DETECTED
Yersinia enterocolitica: NOT DETECTED

## 2018-07-15 LAB — C DIFFICILE QUICK SCREEN W PCR REFLEX
C Diff antigen: NEGATIVE
C Diff interpretation: NOT DETECTED
C Diff toxin: NEGATIVE

## 2018-07-16 LAB — CALPROTECTIN, FECAL: Calprotectin, Fecal: 218 ug/g — ABNORMAL HIGH (ref 0–120)

## 2018-07-20 LAB — PANCREATIC ELASTASE, FECAL: Pancreatic Elastase-1, Stool: 226 ug Elast./g (ref >200)

## 2018-07-31 ENCOUNTER — Other Ambulatory Visit: Payer: Self-pay | Admitting: Student

## 2018-07-31 DIAGNOSIS — R159 Full incontinence of feces: Secondary | ICD-10-CM

## 2018-07-31 DIAGNOSIS — K529 Noninfective gastroenteritis and colitis, unspecified: Secondary | ICD-10-CM

## 2018-09-09 ENCOUNTER — Emergency Department: Payer: Medicare Other

## 2018-09-09 ENCOUNTER — Other Ambulatory Visit: Payer: Self-pay

## 2018-09-09 ENCOUNTER — Inpatient Hospital Stay
Admission: EM | Admit: 2018-09-09 | Discharge: 2018-09-11 | DRG: 190 | Disposition: A | Discharge status: Home / Self Care | Payer: Medicare Other | Attending: Internal Medicine | Admitting: Internal Medicine

## 2018-09-09 DIAGNOSIS — Z20828 Contact with and (suspected) exposure to other viral communicable diseases: Secondary | ICD-10-CM | POA: Diagnosis present

## 2018-09-09 DIAGNOSIS — R0602 Shortness of breath: Secondary | ICD-10-CM

## 2018-09-09 DIAGNOSIS — J9621 Acute and chronic respiratory failure with hypoxia: Secondary | ICD-10-CM | POA: Diagnosis present

## 2018-09-09 DIAGNOSIS — Z87891 Personal history of nicotine dependence: Secondary | ICD-10-CM

## 2018-09-09 DIAGNOSIS — J441 Chronic obstructive pulmonary disease with (acute) exacerbation: Secondary | ICD-10-CM | POA: Diagnosis not present

## 2018-09-09 DIAGNOSIS — F329 Major depressive disorder, single episode, unspecified: Secondary | ICD-10-CM | POA: Diagnosis present

## 2018-09-09 DIAGNOSIS — R197 Diarrhea, unspecified: Secondary | ICD-10-CM

## 2018-09-09 DIAGNOSIS — K529 Noninfective gastroenteritis and colitis, unspecified: Secondary | ICD-10-CM | POA: Diagnosis present

## 2018-09-09 DIAGNOSIS — I10 Essential (primary) hypertension: Secondary | ICD-10-CM | POA: Diagnosis present

## 2018-09-09 DIAGNOSIS — E785 Hyperlipidemia, unspecified: Secondary | ICD-10-CM | POA: Diagnosis present

## 2018-09-09 DIAGNOSIS — R531 Weakness: Secondary | ICD-10-CM

## 2018-09-09 DIAGNOSIS — Z79899 Other long term (current) drug therapy: Secondary | ICD-10-CM

## 2018-09-09 LAB — CBC
HCT: 35.9 % — ABNORMAL LOW (ref 36.0–46.0)
Hemoglobin: 12 g/dL (ref 12.0–15.0)
MCH: 30.8 pg (ref 26.0–34.0)
MCHC: 33.4 g/dL (ref 30.0–36.0)
MCV: 92.3 fL (ref 80.0–100.0)
Platelets: 237 10*3/uL (ref 150–400)
RBC: 3.89 MIL/uL (ref 3.87–5.11)
RDW: 13.8 % (ref 11.5–15.5)
WBC: 6.3 10*3/uL (ref 4.0–10.5)
nRBC: 0 % (ref 0.0–0.2)

## 2018-09-09 LAB — LIPASE, BLOOD: Lipase: 37 U/L (ref 11–51)

## 2018-09-09 LAB — COMPREHENSIVE METABOLIC PANEL
ALT: 12 U/L (ref 0–44)
AST: 21 U/L (ref 15–41)
Albumin: 4.1 g/dL (ref 3.5–5.0)
Alkaline Phosphatase: 95 U/L (ref 38–126)
Anion gap: 10 (ref 5–15)
BUN: 27 mg/dL — ABNORMAL HIGH (ref 8–23)
CO2: 21 mmol/L — ABNORMAL LOW (ref 22–32)
Calcium: 8.8 mg/dL — ABNORMAL LOW (ref 8.9–10.3)
Chloride: 106 mmol/L (ref 98–111)
Creatinine, Ser: 0.95 mg/dL (ref 0.44–1.00)
GFR calc Af Amer: >60 mL/min (ref >60)
GFR calc non Af Amer: 59 mL/min — ABNORMAL LOW (ref >60)
Glucose, Bld: 108 mg/dL — ABNORMAL HIGH (ref 70–99)
Potassium: 3.7 mmol/L (ref 3.5–5.1)
Sodium: 137 mmol/L (ref 135–145)
Total Bilirubin: 0.4 mg/dL (ref 0.3–1.2)
Total Protein: 7.3 g/dL (ref 6.5–8.1)

## 2018-09-09 MED ORDER — ALBUTEROL SULFATE HFA 108 (90 BASE) MCG/ACT IN AERS
2.0000 | INHALATION_SPRAY | RESPIRATORY_TRACT | Status: DC | PRN
  Administered 2018-09-09 – 2018-09-10 (×2): 2 via RESPIRATORY_TRACT
  Filled 2018-09-09: qty 6.7

## 2018-09-09 MED ORDER — METHYLPREDNISOLONE SODIUM SUCC 125 MG IJ SOLR
125.0000 mg | Freq: Once | INTRAMUSCULAR | Status: AC
  Administered 2018-09-09: 23:00:00 125 mg via INTRAVENOUS
  Filled 2018-09-09 (×2): qty 2

## 2018-09-09 MED ORDER — IOHEXOL 300 MG/ML  SOLN
100.0000 mL | Freq: Once | INTRAMUSCULAR | Status: AC | PRN
  Administered 2018-09-09: 100 mL via INTRAVENOUS
  Filled 2018-09-09: qty 100
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
## 2018-09-09 NOTE — ED Triage Notes (Signed)
Reports intermittent diarrhea for past 4 months, reports increase of weakness over past week and worsening today. Pt alert and oriented X4, active, cooperative, pt in NAD. RR even and unlabored, color WNL.

## 2018-09-09 NOTE — ED Notes (Signed)
Pt trialed off oxygen and remained at 94%-96% without any SOB. Pt will be here for further testing, so placed back on oxygen for a little longer to allow pt breathing to relax and for comfort.

## 2018-09-09 NOTE — ED Notes (Signed)
Placed on 2L [place]  

## 2018-09-09 NOTE — ED Notes (Signed)
Patient transported to CT 

## 2018-09-09 NOTE — ED Notes (Signed)
Attempted to let pt get up to urinate without oxygen, pt did not tolerate well. Room air oxygen below 90%. PT placed back on 2L.

## 2018-09-10 DIAGNOSIS — J9621 Acute and chronic respiratory failure with hypoxia: Secondary | ICD-10-CM | POA: Diagnosis present

## 2018-09-10 DIAGNOSIS — Z79899 Other long term (current) drug therapy: Secondary | ICD-10-CM | POA: Diagnosis not present

## 2018-09-10 DIAGNOSIS — Z20828 Contact with and (suspected) exposure to other viral communicable diseases: Secondary | ICD-10-CM | POA: Diagnosis present

## 2018-09-10 DIAGNOSIS — Z87891 Personal history of nicotine dependence: Secondary | ICD-10-CM | POA: Diagnosis not present

## 2018-09-10 DIAGNOSIS — E785 Hyperlipidemia, unspecified: Secondary | ICD-10-CM | POA: Diagnosis present

## 2018-09-10 DIAGNOSIS — K529 Noninfective gastroenteritis and colitis, unspecified: Secondary | ICD-10-CM | POA: Diagnosis present

## 2018-09-10 DIAGNOSIS — R0602 Shortness of breath: Secondary | ICD-10-CM | POA: Diagnosis present

## 2018-09-10 DIAGNOSIS — J441 Chronic obstructive pulmonary disease with (acute) exacerbation: Secondary | ICD-10-CM | POA: Diagnosis present

## 2018-09-10 DIAGNOSIS — F329 Major depressive disorder, single episode, unspecified: Secondary | ICD-10-CM | POA: Diagnosis present

## 2018-09-10 DIAGNOSIS — I10 Essential (primary) hypertension: Secondary | ICD-10-CM | POA: Diagnosis present

## 2018-09-10 LAB — URINALYSIS, COMPLETE (UACMP) WITH MICROSCOPIC
Bilirubin Urine: NEGATIVE
Glucose, UA: NEGATIVE mg/dL
Ketones, ur: NEGATIVE mg/dL
Leukocytes,Ua: NEGATIVE
Nitrite: NEGATIVE
Protein, ur: NEGATIVE mg/dL
Specific Gravity, Urine: 1.005 (ref 1.005–1.030)
pH: 6 (ref 5.0–8.0)

## 2018-09-10 LAB — SARS CORONAVIRUS 2 BY RT PCR (HOSPITAL ORDER, PERFORMED IN ~~LOC~~ HOSPITAL LAB): SARS Coronavirus 2: NEGATIVE

## 2018-09-10 MED ORDER — ONDANSETRON HCL 4 MG/2ML IJ SOLN: 4.0000 mg | Freq: Four times a day (QID) | INTRAMUSCULAR | Status: DC | PRN

## 2018-09-10 MED ORDER — PRAVASTATIN SODIUM 20 MG PO TABS
10.0000 mg | ORAL_TABLET | Freq: Every day | ORAL | Status: DC
  Administered 2018-09-10: 10 mg via ORAL
  Filled 2018-09-10: qty 1

## 2018-09-10 MED ORDER — HYDROCODONE-ACETAMINOPHEN 5-325 MG PO TABS
1.0000 | ORAL_TABLET | Freq: Once | ORAL | Status: AC
  Administered 2018-09-10: 1 via ORAL
  Filled 2018-09-10: qty 1

## 2018-09-10 MED ORDER — ACETAMINOPHEN 650 MG RE SUPP: 650.0000 mg | Freq: Four times a day (QID) | RECTAL | Status: DC | PRN

## 2018-09-10 MED ORDER — ZOLPIDEM TARTRATE 5 MG PO TABS
5.0000 mg | ORAL_TABLET | Freq: Every day | ORAL | Status: DC
  Administered 2018-09-10: 5 mg via ORAL
  Filled 2018-09-10: qty 1

## 2018-09-10 MED ORDER — ACETAMINOPHEN 325 MG PO TABS
650.0000 mg | ORAL_TABLET | Freq: Four times a day (QID) | ORAL | Status: DC | PRN
  Administered 2018-09-10 – 2018-09-11 (×3): 650 mg via ORAL
  Filled 2018-09-10 (×3): qty 2

## 2018-09-10 MED ORDER — ALBUTEROL SULFATE (2.5 MG/3ML) 0.083% IN NEBU
2.5000 mg | INHALATION_SOLUTION | RESPIRATORY_TRACT | Status: DC | PRN
  Administered 2018-09-10: 2.5 mg via RESPIRATORY_TRACT
  Filled 2018-09-10: qty 3

## 2018-09-10 MED ORDER — ENOXAPARIN SODIUM 40 MG/0.4ML ~~LOC~~ SOLN
40.0000 mg | SUBCUTANEOUS | Status: DC
  Administered 2018-09-10: 40 mg via SUBCUTANEOUS
  Filled 2018-09-10 (×2): qty 0.4

## 2018-09-10 MED ORDER — PREDNISONE 50 MG PO TABS
50.0000 mg | ORAL_TABLET | Freq: Every day | ORAL | Status: DC
  Administered 2018-09-10 – 2018-09-11 (×2): 50 mg via ORAL
  Filled 2018-09-10 (×2): qty 1

## 2018-09-10 MED ORDER — ONDANSETRON HCL 4 MG PO TABS: 4.0000 mg | ORAL_TABLET | Freq: Four times a day (QID) | ORAL | Status: DC | PRN

## 2018-09-10 MED ORDER — HYDRALAZINE HCL 25 MG PO TABS
25.0000 mg | ORAL_TABLET | Freq: Three times a day (TID) | ORAL | Status: DC
  Administered 2018-09-10 – 2018-09-11 (×5): 25 mg via ORAL
  Filled 2018-09-10 (×5): qty 1

## 2018-09-10 MED ORDER — TRAMADOL HCL 50 MG PO TABS
50.0000 mg | ORAL_TABLET | Freq: Four times a day (QID) | ORAL | Status: DC | PRN
  Filled 2018-09-10: qty 1

## 2018-09-10 MED ORDER — ESCITALOPRAM OXALATE 10 MG PO TABS
10.0000 mg | ORAL_TABLET | Freq: Two times a day (BID) | ORAL | Status: DC
  Administered 2018-09-10 – 2018-09-11 (×4): 10 mg via ORAL
  Filled 2018-09-10 (×4): qty 1

## 2018-09-10 MED ORDER — UMECLIDINIUM-VILANTEROL 62.5-25 MCG/INH IN AEPB
1.0000 | INHALATION_SPRAY | Freq: Every day | RESPIRATORY_TRACT | Status: DC
  Administered 2018-09-10 – 2018-09-11 (×2): 1 via RESPIRATORY_TRACT
  Filled 2018-09-10: qty 14

## 2018-09-10 MED ORDER — DILTIAZEM HCL 30 MG PO TABS
120.0000 mg | ORAL_TABLET | Freq: Three times a day (TID) | ORAL | Status: DC
  Administered 2018-09-10 – 2018-09-11 (×5): 120 mg via ORAL
  Filled 2018-09-10 (×5): qty 4
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
## 2018-09-10 NOTE — Progress Notes (Signed)
   09/10/18 2000  Clinical Encounter Type  Visited With Patient  Visit Type Initial  Spiritual Encounters  Spiritual Needs Prayer;Emotional;Grief support  Stress Factors  Patient Stress Factors Exhausted;Health changes;Loss of control
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
## 2018-09-10 NOTE — Plan of Care (Signed)
Pt arrived to unit from ED this shift. Alert and oriented x 4. Denied any pain when asked. Dyspnea observed with exertion and at rest.  95% on O2 @ 2L. NSR on tele. Fall precautions initiated. Will continue to monitor.   Problem: Health Behavior/Discharge Planning: Goal: Ability to manage health-related needs will improve Outcome: Progressing   Problem: Clinical Measurements: Goal: Will remain free from infection Outcome: Progressing Goal: Respiratory complications will improve Outcome: Progressing   Problem: Coping: Goal: Level of anxiety will decrease Outcome: Progressing   Problem: Elimination: Goal: Will not experience complications related to urinary retention Outcome: Progressing   Problem: Pain Managment: Goal: General experience of comfort will improve Outcome: Progressing

## 2018-09-10 NOTE — Progress Notes (Signed)
Rachel [name] at [place] NAME: Rachel [name]    MR#:  [id number]  DATE OF BIRTH:  [birth year]  SUBJECTIVE:  CHIEF COMPLAINT:   Chief Complaint  Patient presents with  . Diarrhea  . Weakness  . Shortness of Breath   Came with shortness of breath.  She has chronic complaint of diarrhea and was followed by GI.  Currently does not have any problems related to diarrhea.  Only had one bowel movement yesterday. Feels still short of breath and wheeze.  No fever. REVIEW OF SYSTEMS:  CONSTITUTIONAL: No fever, fatigue or weakness.  EYES: No blurred or double vision.  EARS, NOSE, AND THROAT: No tinnitus or ear pain.  RESPIRATORY: No cough, have shortness of breath, no wheezing or hemoptysis.  CARDIOVASCULAR: No chest pain, orthopnea, edema.  GASTROINTESTINAL: No nausea, vomiting, diarrhea or abdominal pain.  GENITOURINARY: No dysuria, hematuria.  ENDOCRINE: No polyuria, nocturia,  HEMATOLOGY: No anemia, easy bruising or bleeding SKIN: No rash or lesion. MUSCULOSKELETAL: No joint pain or arthritis.   NEUROLOGIC: No tingling, numbness, weakness.  PSYCHIATRY: No anxiety or depression.   ROS  DRUG ALLERGIES:  No Known Allergies  VITALS:  Blood pressure (!) 174/79, pulse 92, temperature 98 F (36.7 C), temperature source Oral, resp. rate 18, height 5\' 6"  (1.676 m), weight 103.9 kg, SpO2 95 %.  PHYSICAL EXAMINATION:  GENERAL:  73 y.o.-year-old patient lying in the bed with no acute distress.  EYES: Pupils equal, round, reactive to light and accommodation. No scleral icterus. Extraocular muscles intact.  HEENT: Head atraumatic, normocephalic. Oropharynx and nasopharynx clear.  NECK:  Supple, no jugular venous distention. No thyroid enlargement, no tenderness.  LUNGS: Normal breath sounds bilaterally, some wheezing,no rales,rhonchi or crepitation. No use of accessory muscles of respiration.  CARDIOVASCULAR: S1, S2 normal. No murmurs, rubs, or  gallops.  ABDOMEN: Soft, nontender, nondistended. Bowel sounds present. No organomegaly or mass.  EXTREMITIES: No pedal edema, cyanosis, or clubbing.  NEUROLOGIC: Cranial nerves II through XII are intact. Muscle strength 5/5 in all extremities. Sensation intact. Gait not checked.  PSYCHIATRIC: The patient is alert and oriented x 3.  SKIN: No obvious rash, lesion, or ulcer.   Physical Exam LABORATORY PANEL:   CBC Recent Labs  Lab 09/09/18 2204  WBC 6.3  HGB 12.0  HCT 35.9*  PLT 237   ------------------------------------------------------------------------------------------------------------------  Chemistries  Recent Labs  Lab 09/09/18 2204  NA 137  K 3.7  CL 106  CO2 21*  GLUCOSE 108*  BUN 27*  CREATININE 0.95  CALCIUM 8.8*  AST 21  ALT 12  ALKPHOS 95  BILITOT 0.4   ------------------------------------------------------------------------------------------------------------------  Cardiac Enzymes No results for input(s): TROPONINI in the last 168 hours. ------------------------------------------------------------------------------------------------------------------  RADIOLOGY:  Ct Abdomen Pelvis W Contrast  Result Date: 09/10/2018 CLINICAL DATA:  Initial evaluation for acute diarrhea, weakness. EXAM: CT ABDOMEN AND PELVIS WITH CONTRAST TECHNIQUE: Multidetector CT imaging of the abdomen and pelvis was performed using the standard protocol following bolus administration of intravenous contrast. CONTRAST:  149mL OMNIPAQUE IOHEXOL 300 MG/ML  SOLN COMPARISON:  Prior CT from 12/27/2013. FINDINGS: Lower chest: Severe emphysematous changes with scattered chronic pleuroparenchymal scarring present at the lung bases. Visualized lungs demonstrate no acute finding. Valvular calcifications noted within the visualized heart. Hepatobiliary: Liver demonstrates a normal contrast enhanced appearance. Gallbladder within normal limits. No biliary dilatation. Pancreas: Diffuse fatty  infiltration the pancreas noted. Pancreas otherwise unremarkable. Spleen: Spleen within normal limits. Adrenals/Urinary Tract: Adrenal glands are normal. Kidneys equal  in size with symmetric enhancement. Diffuse cortical thinning noted about the kidneys bilaterally. No nephrolithiasis, hydronephrosis or focal enhancing renal mass. No hydroureter. Partially distended bladder within normal limits. Stomach/Bowel: Small hiatal hernia noted. Stomach otherwise unremarkable. No evidence for bowel obstruction. Normal appendix. Colonic diverticulosis without evidence for acute diverticulitis. No acute inflammatory changes seen about the bowels. Vascular/Lymphatic: Moderate aorto bi-iliac atherosclerotic disease. No aneurysm. Mesenteric vessels patent proximally. No adenopathy. Reproductive: Multiple small calcified uterine fibroids noted. Ovaries not well seen, and may be atrophic and/or absent. Other: No free air or fluid. Small fat containing paraumbilical and left inguinal hernias noted. Musculoskeletal: Fixation rod partially visualize within the left femur. Gluteal musculature atrophic bilaterally. No acute osseous finding. No discrete lytic or blastic osseous lesions. Mild chronic height loss at the superior endplate of L4. IMPRESSION: 1. No CT evidence for acute intra-abdominal or pelvic process. 2. Sigmoid diverticulosis without evidence for acute diverticulitis. 3. Moderate aorto bi-iliac atherosclerotic disease. No aneurysm. 4. Calcified uterine fibroids. 5. Severe emphysema. Electronically Signed   By: Jeannine Boga M.D.   On: 09/10/2018 00:29   Dg Chest Portable 1 View  Result Date: 09/09/2018 CLINICAL DATA:  73 year old female with diarrhea, weakness, hypoxia. EXAM: PORTABLE CHEST 1 VIEW COMPARISON:  Chest CT low-dose screening 01/02/2018 and earlier. FINDINGS: Portable AP upright view at 2210 hours. Chronic curvilinear scarring and architectural distortion in the right lung. Stable lung volumes.  Stable cardiac size and mediastinal contours. Mildly increased bilateral interstitial markings compared to 2011. No pneumothorax, pleural effusion or other confluent opacity. Visualized tracheal air column is within normal limits. No acute osseous abnormality identified. Paucity of bowel gas in the upper abdomen. IMPRESSION: Chronic lung disease with mildly increased pulmonary interstitial markings compared to 2011. Viral/atypical respiratory infection or pulmonary vascular congestion are difficult to exclude. No pleural effusion. Electronically Signed   By: Genevie Ann M.D.   On: 09/09/2018 22:30    ASSESSMENT AND PLAN:   Active Problems:   Acute on chronic respiratory failure with hypoxemia (HCC)  1.  Respiratory failure: Acute on chronic; with hypoxia.   Treat COPD and try to taper oxygen. 2.  COPD exacerbation: Uncontrolled; continue long-acting bronchial agonist as well as inhaled corticosteroid.  Albuterol as needed.  Taper prednisone therapy. 3.  Hypertension: Controlled; continue diltiazem 4.  Hyperlipidemia: Continue statin therapy 5.  Depression: Controlled; continue Lexapro 6.  DVT prophylaxis: Lovenox 7.  GI prophylaxis: None 8.  Chronic diarrhea-she had followed with Dr. Vira Agar in the clinic in the past.  I advised to continue to follow.  Currently no active symptoms for   All the records are reviewed and case discussed with Care Management/Social Workerr. Management plans discussed with the patient, family and they are in agreement.  CODE STATUS: Full code.  TOTAL TIME TAKING CARE OF THIS PATIENT: 35 minutes.    POSSIBLE D/C IN 1-2 DAYS, DEPENDING ON CLINICAL CONDITION.   Vaughan Basta M.D on 09/10/2018   Between 7am to 6pm - Pager - [phone]  After 6pm go to [email or web address] - password [email or web address] [facility]  Office  [phone]  CC: Primary care physician; Albina Billet, MD  Note: This dictation was prepared with Dragon dictation along  with smaller phrase technology. Any transcriptional errors that result from this process are unintentional.

## 2018-09-11 LAB — TROPONIN I: Troponin I: <0.03 ng/mL (ref <0.03)

## 2018-09-11 MED ORDER — PREDNISONE 10 MG (21) PO TBPK: ORAL_TABLET | ORAL | 0 refills | Status: DC

## 2018-09-11 MED ORDER — AZITHROMYCIN 250 MG PO TABS: 250.0000 mg | ORAL_TABLET | Freq: Every day | ORAL | 0 refills | Status: AC

## 2018-09-11 MED ORDER — ALUM & MAG HYDROXIDE-SIMETH 200-200-20 MG/5ML PO SUSP
30.0000 mL | Freq: Once | ORAL | Status: AC
  Administered 2018-09-11: 30 mL via ORAL
  Filled 2018-09-11: qty 30

## 2018-09-11 MED ORDER — AZITHROMYCIN 250 MG PO TABS
250.0000 mg | ORAL_TABLET | Freq: Every day | ORAL | Status: DC
  Administered 2018-09-11: 250 mg via ORAL
  Filled 2018-09-11: qty 1

## 2018-09-11 MED ORDER — PANTOPRAZOLE SODIUM 40 MG PO TBEC: 40.0000 mg | DELAYED_RELEASE_TABLET | Freq: Every day | ORAL | 0 refills | Status: DC

## 2018-09-11 MED ORDER — PANTOPRAZOLE SODIUM 40 MG PO TBEC
40.0000 mg | DELAYED_RELEASE_TABLET | Freq: Every day | ORAL | Status: DC
  Administered 2018-09-11: 40 mg via ORAL
  Filled 2018-09-11: qty 1

## 2018-09-11 NOTE — Plan of Care (Signed)
  Problem: Health Behavior/Discharge Planning: Goal: Ability to manage health-related needs will improve Outcome: Progressing   Problem: Clinical Measurements: Goal: Will remain free from infection Outcome: Progressing Goal: Respiratory complications will improve Outcome: Progressing   Problem: Nutrition: Goal: Adequate nutrition will be maintained Outcome: Progressing   Problem: Coping: Goal: Level of anxiety will decrease Outcome: Progressing   Problem: Pain Managment: Goal: General experience of comfort will improve Outcome: Progressing

## 2018-09-11 NOTE — Discharge Instructions (Signed)
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
## 2019-01-11 ENCOUNTER — Telehealth: Payer: Self-pay | Admitting: *Deleted

## 2019-01-11 NOTE — Telephone Encounter (Signed)
Left message for patient to return call to Shawn at [phone] in regards to scheduling annual CT scan.

## 2019-02-12 ENCOUNTER — Encounter: Payer: Self-pay | Admitting: *Deleted

## 2019-04-20 ENCOUNTER — Encounter: Payer: Self-pay | Admitting: *Deleted

## 2019-07-14 ENCOUNTER — Emergency Department: Payer: Medicare Other

## 2019-07-14 ENCOUNTER — Inpatient Hospital Stay
Admission: EM | Admit: 2019-07-14 | Discharge: 2019-07-20 | DRG: 682 | Disposition: A | Discharge status: Skilled Nursing Facility | Payer: Medicare Other | Attending: Internal Medicine | Admitting: Internal Medicine

## 2019-07-14 ENCOUNTER — Other Ambulatory Visit: Payer: Self-pay

## 2019-07-14 DIAGNOSIS — J441 Chronic obstructive pulmonary disease with (acute) exacerbation: Secondary | ICD-10-CM

## 2019-07-14 DIAGNOSIS — Z20822 Contact with and (suspected) exposure to covid-19: Secondary | ICD-10-CM | POA: Diagnosis present

## 2019-07-14 DIAGNOSIS — J9621 Acute and chronic respiratory failure with hypoxia: Secondary | ICD-10-CM | POA: Diagnosis present

## 2019-07-14 DIAGNOSIS — I10 Essential (primary) hypertension: Secondary | ICD-10-CM | POA: Diagnosis present

## 2019-07-14 DIAGNOSIS — E785 Hyperlipidemia, unspecified: Secondary | ICD-10-CM | POA: Diagnosis present

## 2019-07-14 DIAGNOSIS — R9401 Abnormal electroencephalogram [EEG]: Secondary | ICD-10-CM | POA: Diagnosis present

## 2019-07-14 DIAGNOSIS — R410 Disorientation, unspecified: Secondary | ICD-10-CM

## 2019-07-14 DIAGNOSIS — H9190 Unspecified hearing loss, unspecified ear: Secondary | ICD-10-CM | POA: Diagnosis present

## 2019-07-14 DIAGNOSIS — G931 Anoxic brain damage, not elsewhere classified: Secondary | ICD-10-CM | POA: Diagnosis present

## 2019-07-14 DIAGNOSIS — B029 Zoster without complications: Secondary | ICD-10-CM

## 2019-07-14 DIAGNOSIS — R0902 Hypoxemia: Secondary | ICD-10-CM

## 2019-07-14 DIAGNOSIS — N179 Acute kidney failure, unspecified: Principal | ICD-10-CM

## 2019-07-14 DIAGNOSIS — J189 Pneumonia, unspecified organism: Secondary | ICD-10-CM

## 2019-07-14 DIAGNOSIS — Z808 Family history of malignant neoplasm of other organs or systems: Secondary | ICD-10-CM

## 2019-07-14 DIAGNOSIS — Z9621 Cochlear implant status: Secondary | ICD-10-CM | POA: Diagnosis present

## 2019-07-14 DIAGNOSIS — G92 Toxic encephalopathy: Secondary | ICD-10-CM | POA: Diagnosis present

## 2019-07-14 DIAGNOSIS — Z801 Family history of malignant neoplasm of trachea, bronchus and lung: Secondary | ICD-10-CM

## 2019-07-14 DIAGNOSIS — F329 Major depressive disorder, single episode, unspecified: Secondary | ICD-10-CM | POA: Diagnosis present

## 2019-07-14 DIAGNOSIS — Z87891 Personal history of nicotine dependence: Secondary | ICD-10-CM

## 2019-07-14 DIAGNOSIS — Z6841 Body Mass Index (BMI) 40.0 and over, adult: Secondary | ICD-10-CM

## 2019-07-14 DIAGNOSIS — Z9981 Dependence on supplemental oxygen: Secondary | ICD-10-CM

## 2019-07-14 LAB — COMPREHENSIVE METABOLIC PANEL WITH GFR
ALT: 31 U/L (ref 0–44)
AST: 64 U/L — ABNORMAL HIGH (ref 15–41)
Albumin: 4.4 g/dL (ref 3.5–5.0)
Alkaline Phosphatase: 75 U/L (ref 38–126)
Anion gap: 12 (ref 5–15)
BUN: 41 mg/dL — ABNORMAL HIGH (ref 8–23)
CO2: 25 mmol/L (ref 22–32)
Calcium: 9.3 mg/dL (ref 8.9–10.3)
Chloride: 99 mmol/L (ref 98–111)
Creatinine, Ser: 1.26 mg/dL — ABNORMAL HIGH (ref 0.44–1.00)
GFR calc Af Amer: 49 mL/min — ABNORMAL LOW
GFR calc non Af Amer: 42 mL/min — ABNORMAL LOW
Glucose, Bld: 125 mg/dL — ABNORMAL HIGH (ref 70–99)
Potassium: 4.3 mmol/L (ref 3.5–5.1)
Sodium: 136 mmol/L (ref 135–145)
Total Bilirubin: 0.8 mg/dL (ref 0.3–1.2)
Total Protein: 8.3 g/dL — ABNORMAL HIGH (ref 6.5–8.1)

## 2019-07-14 LAB — URINALYSIS, COMPLETE (UACMP) WITH MICROSCOPIC
Bacteria, UA: NONE SEEN
Bilirubin Urine: NEGATIVE
Glucose, UA: NEGATIVE mg/dL
Hgb urine dipstick: NEGATIVE
Ketones, ur: NEGATIVE mg/dL
Leukocytes,Ua: NEGATIVE
Nitrite: NEGATIVE
Protein, ur: 100 mg/dL — AB
Specific Gravity, Urine: 1.03 (ref 1.005–1.030)
pH: 5 (ref 5.0–8.0)

## 2019-07-14 LAB — CBC
HCT: 45.9 % (ref 36.0–46.0)
Hemoglobin: 15.4 g/dL — ABNORMAL HIGH (ref 12.0–15.0)
MCH: 31 pg (ref 26.0–34.0)
MCHC: 33.6 g/dL (ref 30.0–36.0)
MCV: 92.5 fL (ref 80.0–100.0)
Platelets: 131 K/uL — ABNORMAL LOW (ref 150–400)
RBC: 4.96 MIL/uL (ref 3.87–5.11)
RDW: 14.1 % (ref 11.5–15.5)
WBC: 5.2 K/uL (ref 4.0–10.5)
nRBC: 0 % (ref 0.0–0.2)

## 2019-07-14 LAB — BLOOD GAS, VENOUS
Acid-Base Excess: 0.3 mmol/L (ref 0.0–2.0)
Bicarbonate: 26.5 mmol/L (ref 20.0–28.0)
O2 Saturation: 46.4 %
Patient temperature: 37
pCO2, Ven: 48 mmHg (ref 44.0–60.0)
pH, Ven: 7.35 (ref 7.250–7.430)
pO2, Ven: <31 mmHg — CL (ref 32.0–45.0)

## 2019-07-14 LAB — TROPONIN I (HIGH SENSITIVITY): Troponin I (High Sensitivity): 22 ng/L — ABNORMAL HIGH (ref <18)

## 2019-07-14 LAB — LACTIC ACID, PLASMA: Lactic Acid, Venous: 1.7 mmol/L (ref 0.5–1.9)

## 2019-07-14 LAB — BRAIN NATRIURETIC PEPTIDE: B Natriuretic Peptide: 33 pg/mL (ref 0.0–100.0)

## 2019-07-14 LAB — POC SARS CORONAVIRUS 2 AG: SARS Coronavirus 2 Ag: NEGATIVE

## 2019-07-14 MED ORDER — METHYLPREDNISOLONE SODIUM SUCC 125 MG IJ SOLR
125.0000 mg | Freq: Once | INTRAMUSCULAR | Status: AC
  Administered 2019-07-14: 125 mg via INTRAVENOUS
  Filled 2019-07-14: qty 2

## 2019-07-14 MED ORDER — IPRATROPIUM-ALBUTEROL 0.5-2.5 (3) MG/3ML IN SOLN
3.0000 mL | Freq: Once | RESPIRATORY_TRACT | Status: AC
  Administered 2019-07-14: 23:00:00 3 mL via RESPIRATORY_TRACT
  Filled 2019-07-14: qty 3
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
## 2019-07-15 ENCOUNTER — Inpatient Hospital Stay: Payer: Medicare Other

## 2019-07-15 ENCOUNTER — Encounter: Payer: Self-pay | Admitting: Internal Medicine

## 2019-07-15 DIAGNOSIS — R410 Disorientation, unspecified: Secondary | ICD-10-CM | POA: Diagnosis present

## 2019-07-15 DIAGNOSIS — R9401 Abnormal electroencephalogram [EEG]: Secondary | ICD-10-CM | POA: Diagnosis present

## 2019-07-15 DIAGNOSIS — Z801 Family history of malignant neoplasm of trachea, bronchus and lung: Secondary | ICD-10-CM | POA: Diagnosis not present

## 2019-07-15 DIAGNOSIS — H9190 Unspecified hearing loss, unspecified ear: Secondary | ICD-10-CM | POA: Diagnosis present

## 2019-07-15 DIAGNOSIS — Z9981 Dependence on supplemental oxygen: Secondary | ICD-10-CM | POA: Diagnosis not present

## 2019-07-15 DIAGNOSIS — B029 Zoster without complications: Secondary | ICD-10-CM

## 2019-07-15 DIAGNOSIS — N179 Acute kidney failure, unspecified: Secondary | ICD-10-CM | POA: Diagnosis present

## 2019-07-15 DIAGNOSIS — F329 Major depressive disorder, single episode, unspecified: Secondary | ICD-10-CM | POA: Diagnosis present

## 2019-07-15 DIAGNOSIS — Z20822 Contact with and (suspected) exposure to covid-19: Secondary | ICD-10-CM | POA: Diagnosis present

## 2019-07-15 DIAGNOSIS — Z9621 Cochlear implant status: Secondary | ICD-10-CM | POA: Diagnosis present

## 2019-07-15 DIAGNOSIS — J441 Chronic obstructive pulmonary disease with (acute) exacerbation: Secondary | ICD-10-CM

## 2019-07-15 DIAGNOSIS — Z808 Family history of malignant neoplasm of other organs or systems: Secondary | ICD-10-CM | POA: Diagnosis not present

## 2019-07-15 DIAGNOSIS — G92 Toxic encephalopathy: Secondary | ICD-10-CM | POA: Diagnosis present

## 2019-07-15 DIAGNOSIS — G931 Anoxic brain damage, not elsewhere classified: Secondary | ICD-10-CM | POA: Diagnosis present

## 2019-07-15 DIAGNOSIS — I10 Essential (primary) hypertension: Secondary | ICD-10-CM | POA: Diagnosis present

## 2019-07-15 DIAGNOSIS — Z6841 Body Mass Index (BMI) 40.0 and over, adult: Secondary | ICD-10-CM | POA: Diagnosis not present

## 2019-07-15 DIAGNOSIS — J9621 Acute and chronic respiratory failure with hypoxia: Secondary | ICD-10-CM | POA: Diagnosis present

## 2019-07-15 DIAGNOSIS — E785 Hyperlipidemia, unspecified: Secondary | ICD-10-CM | POA: Diagnosis present

## 2019-07-15 DIAGNOSIS — Z87891 Personal history of nicotine dependence: Secondary | ICD-10-CM | POA: Diagnosis not present

## 2019-07-15 LAB — BASIC METABOLIC PANEL
Anion gap: 9 (ref 5–15)
BUN: 49 mg/dL — ABNORMAL HIGH (ref 8–23)
CO2: 24 mmol/L (ref 22–32)
Calcium: 9 mg/dL (ref 8.9–10.3)
Chloride: 100 mmol/L (ref 98–111)
Creatinine, Ser: 1.29 mg/dL — ABNORMAL HIGH (ref 0.44–1.00)
GFR calc Af Amer: 47 mL/min — ABNORMAL LOW (ref >60)
GFR calc non Af Amer: 41 mL/min — ABNORMAL LOW (ref >60)
Glucose, Bld: 151 mg/dL — ABNORMAL HIGH (ref 70–99)
Potassium: 4.1 mmol/L (ref 3.5–5.1)
Sodium: 133 mmol/L — ABNORMAL LOW (ref 135–145)

## 2019-07-15 LAB — URINE DRUG SCREEN, QUALITATIVE (ARMC ONLY)
Amphetamines, Ur Screen: NOT DETECTED
Barbiturates, Ur Screen: NOT DETECTED
Benzodiazepine, Ur Scrn: NOT DETECTED
Cannabinoid 50 Ng, Ur ~~LOC~~: NOT DETECTED
Cocaine Metabolite,Ur ~~LOC~~: NOT DETECTED
MDMA (Ecstasy)Ur Screen: NOT DETECTED
Methadone Scn, Ur: NOT DETECTED
Opiate, Ur Screen: NOT DETECTED
Phencyclidine (PCP) Ur S: NOT DETECTED
Tricyclic, Ur Screen: NOT DETECTED

## 2019-07-15 LAB — CBC
HCT: 43 % (ref 36.0–46.0)
Hemoglobin: 14.5 g/dL (ref 12.0–15.0)
MCH: 30.9 pg (ref 26.0–34.0)
MCHC: 33.7 g/dL (ref 30.0–36.0)
MCV: 91.7 fL (ref 80.0–100.0)
Platelets: 114 10*3/uL — ABNORMAL LOW (ref 150–400)
RBC: 4.69 MIL/uL (ref 3.87–5.11)
RDW: 14 % (ref 11.5–15.5)
WBC: 5.4 10*3/uL (ref 4.0–10.5)
nRBC: 0 % (ref 0.0–0.2)

## 2019-07-15 LAB — TSH: TSH: 1.421 u[IU]/mL (ref 0.350–4.500)

## 2019-07-15 LAB — VITAMIN B12: Vitamin B-12: 269 pg/mL (ref 180–914)

## 2019-07-15 LAB — TROPONIN I (HIGH SENSITIVITY): Troponin I (High Sensitivity): 20 ng/L — ABNORMAL HIGH (ref <18)

## 2019-07-15 LAB — SARS CORONAVIRUS 2 (TAT 6-24 HRS): SARS Coronavirus 2: NEGATIVE

## 2019-07-15 LAB — SEDIMENTATION RATE: Sed Rate: 6 mm/hr (ref 0–30)

## 2019-07-15 LAB — HIV ANTIBODY (ROUTINE TESTING W REFLEX): HIV Screen 4th Generation wRfx: NONREACTIVE

## 2019-07-15 MED ORDER — ENOXAPARIN SODIUM 40 MG/0.4ML ~~LOC~~ SOLN
40.0000 mg | Freq: Two times a day (BID) | SUBCUTANEOUS | Status: DC
  Administered 2019-07-15 – 2019-07-20 (×10): 40 mg via SUBCUTANEOUS
  Filled 2019-07-15 (×11): qty 0.4

## 2019-07-15 MED ORDER — VALACYCLOVIR HCL 500 MG PO TABS
1000.0000 mg | ORAL_TABLET | Freq: Two times a day (BID) | ORAL | Status: DC
  Administered 2019-07-15 – 2019-07-20 (×10): 1000 mg via ORAL
  Filled 2019-07-15 (×12): qty 2

## 2019-07-15 MED ORDER — PREDNISONE 20 MG PO TABS
40.0000 mg | ORAL_TABLET | Freq: Every day | ORAL | Status: AC
  Administered 2019-07-16 – 2019-07-19 (×4): 40 mg via ORAL
  Filled 2019-07-15 (×4): qty 2

## 2019-07-15 MED ORDER — VALACYCLOVIR HCL 500 MG PO TABS
500.0000 mg | ORAL_TABLET | Freq: Two times a day (BID) | ORAL | Status: DC
  Administered 2019-07-15 (×2): 500 mg via ORAL
  Filled 2019-07-15 (×3): qty 1

## 2019-07-15 MED ORDER — OXYCODONE HCL 5 MG PO TABS
5.0000 mg | ORAL_TABLET | Freq: Four times a day (QID) | ORAL | Status: DC | PRN
  Administered 2019-07-15 – 2019-07-20 (×12): 5 mg via ORAL
  Filled 2019-07-15 (×13): qty 1

## 2019-07-15 MED ORDER — ENSURE MAX PROTEIN PO LIQD
11.0000 [oz_av] | Freq: Every day | ORAL | Status: DC
  Administered 2019-07-17 – 2019-07-20 (×3): 11 [oz_av] via ORAL
  Filled 2019-07-15: qty 330

## 2019-07-15 MED ORDER — DILTIAZEM HCL 30 MG PO TABS
120.0000 mg | ORAL_TABLET | Freq: Three times a day (TID) | ORAL | Status: DC
  Administered 2019-07-15 – 2019-07-20 (×14): 120 mg via ORAL
  Filled 2019-07-15 (×4): qty 4
  Filled 2019-07-15: qty 2
  Filled 2019-07-15 (×2): qty 4
  Filled 2019-07-15: qty 2
  Filled 2019-07-15 (×6): qty 4
  Filled 2019-07-15: qty 2
  Filled 2019-07-15 (×3): qty 4

## 2019-07-15 MED ORDER — SODIUM CHLORIDE 0.9 % IV SOLN: INTRAVENOUS | Status: DC

## 2019-07-15 MED ORDER — ACETAMINOPHEN 325 MG PO TABS
650.0000 mg | ORAL_TABLET | Freq: Four times a day (QID) | ORAL | Status: DC | PRN
  Administered 2019-07-16 – 2019-07-19 (×3): 650 mg via ORAL
  Filled 2019-07-15 (×3): qty 2

## 2019-07-15 MED ORDER — PRAVASTATIN SODIUM 20 MG PO TABS
10.0000 mg | ORAL_TABLET | Freq: Every day | ORAL | Status: DC
  Administered 2019-07-15 – 2019-07-19 (×5): 10 mg via ORAL
  Filled 2019-07-15 (×5): qty 1

## 2019-07-15 MED ORDER — ZOLPIDEM TARTRATE 5 MG PO TABS
5.0000 mg | ORAL_TABLET | Freq: Every day | ORAL | Status: DC
  Administered 2019-07-15 – 2019-07-19 (×4): 5 mg via ORAL
  Filled 2019-07-15 (×4): qty 1

## 2019-07-15 MED ORDER — SODIUM CHLORIDE 0.9 % IV SOLN
1.0000 g | INTRAVENOUS | Status: DC
  Administered 2019-07-15 – 2019-07-17 (×3): 1 g via INTRAVENOUS
  Filled 2019-07-15 (×2): qty 1
  Filled 2019-07-15 (×2): qty 10

## 2019-07-15 MED ORDER — METHYLPREDNISOLONE SODIUM SUCC 125 MG IJ SOLR
60.0000 mg | Freq: Two times a day (BID) | INTRAMUSCULAR | Status: AC
  Administered 2019-07-15: 60 mg via INTRAVENOUS
  Filled 2019-07-15 (×2): qty 2

## 2019-07-15 MED ORDER — HYDROCERIN EX CREA
TOPICAL_CREAM | Freq: Two times a day (BID) | CUTANEOUS | Status: DC
  Filled 2019-07-15: qty 113

## 2019-07-15 MED ORDER — MORPHINE SULFATE (PF) 2 MG/ML IV SOLN
2.0000 mg | INTRAVENOUS | Status: DC | PRN
  Administered 2019-07-16 – 2019-07-19 (×3): 2 mg via INTRAVENOUS
  Filled 2019-07-15 (×3): qty 1

## 2019-07-15 MED ORDER — HYDRALAZINE HCL 25 MG PO TABS
25.0000 mg | ORAL_TABLET | Freq: Three times a day (TID) | ORAL | Status: DC
  Administered 2019-07-15 – 2019-07-20 (×13): 25 mg via ORAL
  Filled 2019-07-15 (×16): qty 1

## 2019-07-15 NOTE — Progress Notes (Signed)
PHARMACIST - PHYSICIAN COMMUNICATION  CONCERNING:  Enoxaparin (Lovenox) for DVT Prophylaxis    RECOMMENDATION: Patient was prescribed enoxaprin 40mg  q24 hours for VTE prophylaxis.   Filed Weights   07/14/19 2030  Weight: 255 lb 11.7 oz (116 kg)    Body mass index is 41.28 kg/m.  Estimated Creatinine Clearance: 50.7 mL/min (A) (by C-G formula based on SCr of 1.26 mg/dL (H)).   Based on [place] patient is candidate for enoxaparin 40mg  every 12 hour dosing due to BMI being >40.  DESCRIPTION: Pharmacy has adjusted enoxaparin dose per [facility] policy.  Patient is now receiving enoxaparin 40mg  every 12 hours.   Ena Dawley, PharmD Clinical Pharmacist  07/15/2019 3:21 AM
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
## 2019-07-15 NOTE — TOC Initial Note (Signed)
Transition of Care [facility]) - Initial/Assessment Note    Patient Details  Name: Rachel [name] MRN: [id number] Date of Birth: 1946-02-10  [facility] CM/SW Contact:    Elease Hashimoto, LCSW Phone Number: 07/15/2019, 1:46 PM  Clinical Narrative:   Obtained information from son-Michael via telephone and sister-tracy-via telephone. Pt has been slowly declining and not able to care for herself. Due to her shortness of breath she can barely get around and can not clean or take care of herself. Neighbors and sister come by to make sure she has food but not much else. Sister works 10 hour days and can not provide care, two son's live in [place] and [place] and are here every six months. Discussed going to rehab from here and then transition to ALF. She needs to be somewhere she can be monitored and her needs are met. Discussed applying for Medicaid and obtaining HCPOA and POA. Son will look into this and talk with pt regarding going to rehab from here. Pt's only access is her social security check and a car, she lives in her son's home. Will follow to discuss safe discharge plan and include pt in plans also. Will start FL2 and bed search in case medically ready quickly. Will continue to work with sister and son on plans.            Expected Discharge Plan: Skilled Nursing Facility Barriers to Discharge: Continued Medical Work up   Patient Goals and CMS Choice Patient states their goals for this hospitalization and ongoing recovery are:: Pt is in a contact room and not answering the phone. Obtained information from sister-Tracy and son-Michael      Expected Discharge Plan and Services Expected Discharge Plan: [place] In-house Referral: Clinical Social Work Discharge Planning Services: NA Post Acute Care Choice: [place] Living arrangements for the past 2 months: [place]                                      Prior Living  Arrangements/Services Living arrangements for the past 2 months: Single Family Home Lives with:: Self          Need for Family Participation in Patient Care: Yes (Comment) Care giver support system in place?: No (comment)      Activities of Daily Living Home Assistive Devices/Equipment: None ADL Screening (condition at time of admission) Patient's cognitive ability adequate to safely complete daily activities?: Yes Is the patient deaf or have difficulty hearing?: Yes Does the patient have difficulty seeing, even when wearing glasses/contacts?: No Does the patient have difficulty concentrating, remembering, or making decisions?: No Patient able to express need for assistance with ADLs?: Yes Does the patient have difficulty dressing or bathing?: No Independently performs ADLs?: Yes (appropriate for developmental age) Does the patient have difficulty walking or climbing stairs?: No Weakness of Legs: Both Weakness of Arms/Hands: None  Permission Sought/Granted   Permission granted to share information with : Yes, Verbal Permission Granted  Share Information with NAME: Olivia Mackie     Permission granted to share info w Relationship: sister     Emotional Assessment Appearance:: Appears stated age Attitude/Demeanor/Rapport: Unable to Assess(didn't answer phone in room-contact room) Affect (typically observed): Unable to Assess Orientation: : Oriented to Self, Oriented to Place      Admission diagnosis:  Confusion [R41.0] Hypoxia [R09.02] COPD exacerbation ([place]) [J44.1] COPD with acute exacerbation (  [place]) [J44.1] Patient Active Problem List   Diagnosis Date Noted  . Shingles 07/15/2019  . Acute confusion 07/15/2019  . AKI (acute kidney injury) ([place]) 07/15/2019  . COPD with acute exacerbation ([place]) 07/14/2019  . Acute on chronic respiratory failure with hypoxemia ([place]) 09/10/2018  . Shortness of breath 12/10/2017  . Smoker 12/10/2017  . Essential hypertension 12/10/2017  .  Morbid obesity ([place]) 12/10/2017  . Bilateral hip pain 12/10/2017  . Hyperlipidemia 12/10/2017  . Hard of hearing 12/10/2017  . Facial abscess 12/24/2015  . Open wound of left lower leg 12/29/2013  . Leg laceration 12/23/2013  . Open wound of knee, leg (except thigh), and ankle, complicated 96/92/4932  . Wound of right leg 01/13/2013   PCP:  Albina Billet, MD Pharmacy:   [facility] [address]), [place]) [place] [address] Phone: [phone] Fax: [phone]     Social Determinants of Health (SDOH) Interventions    Readmission Risk Interventions No flowsheet data found.

## 2019-07-15 NOTE — Procedures (Signed)
ELECTROENCEPHALOGRAM REPORT   Patient: Rachel [name]       Room #: 128A-AA EEG No. ID: [id number] Age: 74 y.o.        Sex: female Requesting Physician: Mal Misty Report Date:  07/15/2019        Interpreting Physician: Alexis Goodell  History: Rachel [name] is an 74 y.o. female with altered mental status  Medications:  Rocephin, Cardizem, Solumedrol, Pravachol, Valtrex,   Conditions of Recording:  This is a 21 channel routine scalp EEG performed with bipolar and monopolar montages arranged in accordance to the international 10/20 system of electrode placement. One channel was dedicated to EKG recording.  The patient is in the awake state.  Description:  The background activity is slow and poorly organiized.  It consists low voltage, irregular activity along the delta-theta continuum.  This activity is diffusely organized and continuous.   No epileptiform activity is noted.  Hyperventilation was not performed.  Intermittent photic stimulation was performed but failed to illicit any change in the tracing.   IMPRESSION: This is an abnormal EEG secondary to general background slowing.  This finding may be seen with a diffuse disturbance that is etiologically nonspecific, but may include a metabolic encephalopathy, among other possibilities.  No epileptiform activity was noted.     Alexis Goodell, MD Neurology [phone] 07/15/2019, 6:13 PM

## 2019-07-15 NOTE — Progress Notes (Signed)
Progress Note    Rachel [name]  [id number] DOB: [birth year]  DOA: 07/14/2019 PCP: Albina Billet, MD      Brief Narrative:    Medical records reviewed and are as summarized below:  Rachel [name] is an 74 y.o. female with medical history significant for COPD on home oxygen at 2 L depression, and hearing impairment with a cochlear implant who was brought to the emergency room because of acute confusion.  At baseline, patient is homebound and she and her neighbors check in on her every day and bring food for her.  Her sister last saw her about 3 days ago prior to admission and at that time, her mental status was normal. Apparently on arrival of EMS, she had urinated on herself on the couch and appeared to be staring blankly and they had to lift her into the EMS vehicle.    Reportedly, her condition has been declining for several months and she has been having increasing shortness of breath with exertion even walking from room to room.     There have been some concerns about her continuing to live on her own.    Was also found to have a rash on her chest. On arrival of EMS she was apparently satting at 90% on 2 L and she was increased to 4 L      Assessment/Plan:   Principal Problem:   Acute confusion Active Problems:   Essential hypertension   Morbid obesity (HCC)   Acute on chronic respiratory failure with hypoxemia (HCC)   COPD with acute exacerbation ([place])   Shingles   AKI (acute kidney injury) ([place])   Acute confusional state/acute toxic metabolic encephalopathy: Improved.  Patient has been seen by neurologist who recommended repeat CT scan of the head in 24 to 48 hours.  Mild AKI: Repeat BMP today  Herpes zoster infection: Continue Valtrex.  Analgesics as needed for pain.  Acute on chronic hypoxemic respiratory failure: Continue oxygen via nasal cannula.  COPD with acute exacerbation: Continue steroids and bronchodilators  Hypertension: Continue  antihypertensives   Body mass index is 41.28 kg/m.  (Morbid obesity)   Family Communication/Anticipated D/C date and plan/Code Status   DVT prophylaxis: Lovenox Code Status: Full code Family Communication: Plan discussed with patient Disposition Plan: Patient is from home.  Plan to discharge home when confusion resolves.  She will need repeat CT head prior to discharge.     Subjective:   She complains of new rash on her chest that started about 3 days ago.  She does not report any other complaints.  Objective:    Vitals:   07/15/19 0115 07/15/19 0130 07/15/19 0321 07/15/19 1206  BP:  (!) 141/75 (!) 150/61 (!) 141/84  Pulse: 100 98 91 98  Resp: 14 16 14 18   Temp:   98.2 F (36.8 C) 98.1 F (36.7 C)  TempSrc:    Oral  SpO2: 96% 96% 93% 97%  Weight:      Height:        Intake/Output Summary (Last 24 hours) at 07/15/2019 1318 Last data filed at 07/15/2019 0151 Gross per 24 hour  Intake 100 ml  Output --  Net 100 ml   Filed Weights   07/14/19 2030  Weight: 116 kg    Exam:  GEN: NAD SKIN: Erythematous vesicular rash on right upper chest and right upper back EYES: EOMI ENT: MMM, bilateral hearing impairment CV: RRR PULM: CTA B ABD: soft, obese, NT, +BS CNS:  AAO x 3, non focal EXT: No edema or tenderness   Data Reviewed:   I have personally reviewed following labs and imaging studies:  Labs: Labs show the following:   Basic Metabolic Panel: Recent Labs  Lab 07/14/19 2034  NA 136  K 4.3  CL 99  CO2 25  GLUCOSE 125*  BUN 41*  CREATININE 1.26*  CALCIUM 9.3   GFR Estimated Creatinine Clearance: 50.7 mL/min (A) (by C-G formula based on SCr of 1.26 mg/dL (H)). Liver Function Tests: Recent Labs  Lab 07/14/19 2034  AST 64*  ALT 31  ALKPHOS 75  BILITOT 0.8  PROT 8.3*  ALBUMIN 4.4   No results for input(s): LIPASE, AMYLASE in the last 168 hours. No results for input(s): AMMONIA in the last 168 hours. Coagulation profile No results for  input(s): INR, PROTIME in the last 168 hours.  CBC: Recent Labs  Lab 07/14/19 2034 07/15/19 0036  WBC 5.2 5.4  HGB 15.4* 14.5  HCT 45.9 43.0  MCV 92.5 91.7  PLT 131* 114*   Cardiac Enzymes: No results for input(s): CKTOTAL, CKMB, CKMBINDEX, TROPONINI in the last 168 hours. BNP (last 3 results) No results for input(s): PROBNP in the last 8760 hours. CBG: No results for input(s): GLUCAP in the last 168 hours. D-Dimer: No results for input(s): DDIMER in the last 72 hours. Hgb A1c: No results for input(s): HGBA1C in the last 72 hours. Lipid Profile: No results for input(s): CHOL, HDL, LDLCALC, TRIG, CHOLHDL, LDLDIRECT in the last 72 hours. Thyroid function studies: No results for input(s): TSH, T4TOTAL, T3FREE, THYROIDAB in the last 72 hours.  Invalid input(s): FREET3 Anemia work up: No results for input(s): VITAMINB12, FOLATE, FERRITIN, TIBC, IRON, RETICCTPCT in the last 72 hours. Sepsis Labs: Recent Labs  Lab 07/14/19 2034 07/15/19 0036  WBC 5.2 5.4  LATICACIDVEN 1.7  --     Microbiology Recent Results (from the past 240 hour(s))  SARS CORONAVIRUS 2 (TAT 6-24 HRS) Nasopharyngeal Nasopharyngeal Swab     Status: None   Collection Time: 07/14/19 11:19 PM   Specimen: Nasopharyngeal Swab  Result Value Ref Range Status   SARS Coronavirus 2 NEGATIVE NEGATIVE Final    Comment: (NOTE) SARS-CoV-2 target nucleic acids are NOT DETECTED. The SARS-CoV-2 RNA is generally detectable in upper and lower respiratory specimens during the acute phase of infection. Negative results do not preclude SARS-CoV-2 infection, do not rule out co-infections with other pathogens, and should not be used as the sole basis for treatment or other patient management decisions. Negative results must be combined with clinical observations, patient history, and epidemiological information. The expected result is Negative. Fact Sheet for Patients: SugarRoll.be Fact Sheet  for Healthcare Providers: https://www.woods-mathews.com/ This test is not yet approved or cleared by the [place] FDA and  has been authorized for detection and/or diagnosis of SARS-CoV-2 by FDA under an Emergency Use Authorization (EUA). This EUA will remain  in effect (meaning this test can be used) for the duration of the COVID-19 declaration under Section 56 4(b)(1) of the Act, 21 U.S.C. section 360bbb-3(b)(1), unless the authorization is terminated or revoked sooner. Performed at [facility], [place] [address] [place], [place] [address]   Culture, blood (routine x 2) Call MD if unable to obtain prior to antibiotics being given     Status: None (Preliminary result)   Collection Time: 07/15/19 12:36 AM   Specimen: BLOOD  Result Value Ref Range Status   Specimen Description BLOOD RIGHT HAND  Final  Special Requests   Final    BOTTLES DRAWN AEROBIC AND ANAEROBIC Blood Culture adequate volume   Culture   Final    NO GROWTH < 12 HOURS Performed at [facility], [address] [place], [place] [address]    Report Status PENDING  Incomplete  Culture, blood (routine x 2) Call MD if unable to obtain prior to antibiotics being given     Status: None (Preliminary result)   Collection Time: 07/15/19 12:36 AM   Specimen: BLOOD  Result Value Ref Range Status   Specimen Description BLOOD LEFT [facility]  Final   Special Requests   Final    BOTTLES DRAWN AEROBIC AND ANAEROBIC Blood Culture adequate volume   Culture   Final    NO GROWTH < 12 HOURS Performed at [facility], [address] [place], [place] [address]    Report Status PENDING  Incomplete    Procedures and diagnostic studies:  CT HEAD WO CONTRAST  Result Date: 07/15/2019 CLINICAL DATA:  Ataxia, stroke suspected EXAM: CT HEAD WITHOUT CONTRAST TECHNIQUE: Contiguous axial images were obtained from the base of the skull through the vertex without intravenous contrast. COMPARISON:  May 18, 2011 FINDINGS: Brain: No evidence of acute territorial infarction, hemorrhage, hydrocephalus,extra-axial collection or mass lesion/mass effect. Somewhat limited due to large amount of overlying artifact likely secondary from cochlear implant. There is dilatation the ventricles and sulci consistent with age-related atrophy. Low-attenuation changes in the deep white matter consistent with small vessel ischemia. Vascular: No hyperdense vessel or unexpected calcification. Skull: The skull is intact. No fracture or focal lesion identified. Sinuses/Orbits: The visualized paranasal sinuses and mastoid air cells are clear. The orbits and globes intact. Other: None IMPRESSION: No acute intracranial abnormality. Significant over artifact from overlying right parietal cochlear implant. Findings consistent with age related atrophy and chronic small vessel ischemia Electronically Signed   By: Prudencio Pair M.D.   On: 07/15/2019 01:12   DG Chest Portable 1 View  Result Date: 07/14/2019 CLINICAL DATA:  Shortness of breath. EXAM: PORTABLE CHEST 1 VIEW COMPARISON:  09/09/2018 FINDINGS: Again noted is cardiomegaly with findings suspicious for underlying interstitial lung disease. Scattered bilateral areas of scarring and atelectasis are again noted and are essentially stable from prior study. There is no significant pleural effusion or pneumothorax. There is no acute displaced fracture. Aortic calcifications are noted. IMPRESSION: Again noted are chronic findings bilaterally as detailed above which may represent underlying interstitial lung disease. A superimposed infectious process is difficult to entirely exclude on this exam. Electronically Signed   By: Constance Holster M.D.   On: 07/14/2019 20:59    Medications:   . diltiazem  120 mg Oral TID  . enoxaparin (LOVENOX) injection  40 mg Subcutaneous Q12H  . hydrALAZINE  25 mg Oral TID  . methylPREDNISolone (SOLU-MEDROL) injection  60 mg Intravenous Q12H   Followed by  .  [START ON 07/16/2019] predniSONE  40 mg Oral Q breakfast  . pravastatin  10 mg Oral q1800  . Ensure Max Protein  11 oz Oral Daily  . valACYclovir  1,000 mg Oral BID   Continuous Infusions: . sodium chloride 100 mL/hr at 07/15/19 0358  . cefTRIAXone (ROCEPHIN)  IV Stopped (07/15/19 0151)     LOS: 0 days   March Steyer  [facility]     07/15/2019, 1:18 PM

## 2019-07-15 NOTE — Plan of Care (Signed)
Patient admitted to 130. O2 4L in place. Telemetry monitoring equipment unavailable at this time. Patient asleep and providing minimal responses to admission questions. Writer will attempt to complete before shift change. Breaths are even and unlabored. No complaints of pain noted. Will continue to monitor.

## 2019-07-15 NOTE — Progress Notes (Signed)
PT Cancellation Note  Patient Details Name: Rachel [name] MRN: [id number] DOB: [birth year]   Cancelled Treatment:    Reason Eval/Treat Not Completed: Patient at procedure or test/unavailable Attempted to see pt x 2 thus far today.  Most recent attempt spoke with nursing, PPE donned and arrived to pt room to find EEG being performed.  Eval, obviously, not attempted at this time.  Will try back later as time allows and pt is appropriate.  Kreg Shropshire, DPT 07/15/2019, 3:55 PM

## 2019-07-15 NOTE — Evaluation (Signed)
Occupational Therapy Evaluation Patient Details Name: Rachel [name] MRN: [id number] DOB: 09-Jul-1945 Today's Date: 07/15/2019    History of Present Illness Rachel [name] is a 74 y.o. female with medical history significant for COPD on home oxygen at 2 L depression, and hearing impairment with a cochlear implant who was brought to the emergency room because of acute confusion.  Per daughter, pt has been declining for several months with increased amount of SOB during activity. No acute neurological findings.  Patient found to have shingles.   Clinical Impression   Patient seen this date for OT evaluation.  Patient is currently on isolation/contact precautions secondary to shingles diagnosis.  Patient is very [place] and has a cochlear implant.  Difficult to assess level of confusion d/t HOH.  Patient alert, polite and agreeable to therapy.  Provided education on goals of OT in acute setting.  Patient required extra time for tasks due to poor activity tolerance and general deconditioning.  Performed simple grooming at bed level with set up.  Transitioned to EOB with HOB elevated and MOD A.  Requires cues for sequencing and body mechanics.  Tolerated sitting at EOB with intermittent UE support for ~7 minutes.  No complaints of dizziness.  UB dressing with CGA and extra time in sitting.  Completed sit to stand using BSC for UE support with MIN A.  MOD A needed for squat/stand pivot transfer and cues on sequencing.  Patient would continue to benefit from skilled occupational therapy services to address a multitude of performance components including activity tolerance, strengthening, EC techniques/compensatory techniques, sitting/standing tolerance, ADL retraining and safety awareness.  Based on today's performance, recommending SNF as follow up recommendation due to increased amount of assistance needed. Thank you.    Follow Up Recommendations  SNF(Patient wishes to return home.  Recommending SNF d/t  need for increased assistance at time of eval.)    Equipment Recommendations  Other (comment)(Defer to next level of care.)    Recommendations for Other Services       Precautions / Restrictions Precautions Precautions: Other (comment);Fall Precaution Comments: Contact and airborne precautions - shingles Restrictions Weight Bearing Restrictions: No      Mobility Bed Mobility Overal bed mobility: Needs Assistance Bed Mobility: Rolling;Supine to Sit Rolling: Min assist   Supine to sit: Mod assist;HOB elevated     General bed mobility comments: Heavy use of bed rails for UE  Transfers Overall transfer level: Needs assistance Equipment used: Standard walker Transfers: Sit to/from Stand;Stand Pivot Transfers Sit to Stand: Min assist;From elevated surface Stand pivot transfers: Mod assist;From elevated surface       General transfer comment: Utilized BSC rails for UE support during SPT.  Poor sequencing.  Difficulty managing leads, 02, etc    Balance Overall balance assessment: Needs assistance Sitting-balance support: Feet supported;No upper extremity supported Sitting balance-Leahy Scale: Fair     Standing balance support: No upper extremity supported Standing balance-Leahy Scale: Fair                             ADL either performed or assessed with clinical judgement   ADL Overall ADL's : Needs assistance/impaired     Grooming: Wash/dry hands;Wash/dry face;Oral care;Brushing hair;Set up;Bed level           Upper Body Dressing : Min guard;Sitting   Lower Body Dressing: Maximal assistance;Cueing for safety;Cueing for sequencing;Cueing for compensatory techniques;Sit to/from stand   Toilet Transfer: Moderate assistance;BSC Armed forces technical officer  Details (indicate cue type and reason): Requires cues for sequencing, safety and hand placement Toileting- Clothing Manipulation and Hygiene: Minimal assistance;Cueing for sequencing;Sitting/lateral lean          General ADL Comments: Requires extra time due to poor activity tolerance and difficulty sequencing.     Vision Patient Visual Report: No change from baseline Vision Assessment?: No apparent visual deficits     Perception     Praxis      Pertinent Vitals/Pain Pain Assessment: Faces Faces Pain Scale: Hurts a little bit Pain Location: Shingles rash on R chest Pain Descriptors / Indicators: Burning;Discomfort;Grimacing Pain Intervention(s): Limited activity within patient's tolerance;Monitored during session;Other (comment)(Requested cream from RN/MD)     Hand Dominance Right   Extremity/Trunk Assessment Upper Extremity Assessment Upper Extremity Assessment: Generalized weakness   Lower Extremity Assessment Lower Extremity Assessment: Defer to PT evaluation       Communication Communication Communication: HOH(Cochlear implant)   Cognition Arousal/Alertness: Awake/alert Behavior During Therapy: WFL for tasks assessed/performed Overall Cognitive Status: No family/caregiver present to determine baseline cognitive functioning                                 General Comments: Difficult to assess as uncertain if confusion is d/t HOH or AMS.   General Comments  Noted with severe rash d/t shingles on anterior chest, back and waist    Exercises Other Exercises Other Exercises: General safety education with use of call button, bed controls, etc Other Exercises: Education on body mechanics/sequencing/hand placement/pacing during functional transfers and bed mobility Other Exercises: Education on overall goals and purpose of OT in acute setting   Shoulder Instructions      Home Living Family/patient expects to be discharged to:: Private residence Living Arrangements: Alone   Type of Home: House Home Access: Stairs to enter Technical brewer of Steps: 4   Home Layout: One level     Bathroom Shower/Tub: Walk-in shower         Home Equipment:  Shower seat   Additional Comments: Patient is very [place] and still slightly confused; difficult to obtain all PLOF information.      Prior Functioning/Environment Level of Independence: Needs assistance  Gait / Transfers Assistance Needed: Patient reports using no DME for household distances. ADL's / Homemaking Assistance Needed: Patient's family and neighbors provided meals.   Comments: Per charting, daughter stated she has noticed a decline in her functional ability in the past few months.        OT Problem List: Decreased strength;Decreased activity tolerance;Impaired balance (sitting and/or standing);Decreased safety awareness;Decreased knowledge of precautions      OT Treatment/Interventions: Self-care/ADL training;Therapeutic exercise;Energy conservation;DME and/or AE instruction;Therapeutic activities;Patient/family education    OT Goals(Current goals can be found in the care plan section) Acute Rehab OT Goals Patient Stated Goal: "move around better" OT Goal Formulation: With patient Time For Goal Achievement: 07/29/19 Potential to Achieve Goals: Good  OT Frequency: Min 2X/week   Barriers to D/C: Other (comment)(Patient lives alone.)          Co-evaluation              AM-PAC OT "6 Clicks" Daily Activity     Outcome Measure Help from another person eating meals?: None Help from another person taking care of personal grooming?: None Help from another person toileting, which includes using toliet, bedpan, or urinal?: A Lot Help from another person bathing (including washing, rinsing, drying)?: A Lot  Help from another person to put on and taking off regular upper body clothing?: A Little Help from another person to put on and taking off regular lower body clothing?: A Lot 6 Click Score: 17   End of Session Nurse Communication: Patient requests pain meds;Precautions;Other (comment)(Patient requesting cream for shingles rash.  Discussed precautions  (airborne/contact).  Discussed need for follow up from nurse d/t leads being off.)  Activity Tolerance: Patient tolerated treatment well Patient left: Other (comment);with call bell/phone within reach(Patient on [facility] with call light in reach.  Neurologist present.  RN notified.)  OT Visit Diagnosis: Unsteadiness on feet (R26.81);Muscle weakness (generalized) (M62.81)                Time: [id number] OT Time Calculation (min): 59 min Charges:  OT General Charges $OT Visit: 1 Visit OT Evaluation $OT Eval Moderate Complexity: 1 Mod OT Treatments $Self Care/Home Management : 38-52 mins $Therapeutic Activity: 8-22 mins  Baldomero Lamy, MS, OTR/L 07/15/19, 11:27 AM

## 2019-07-15 NOTE — Progress Notes (Signed)
eeg completed ° °
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
## 2019-07-15 NOTE — Progress Notes (Signed)
Initial Nutrition Assessment  RD working remotely.  DOCUMENTATION CODES:   Morbid obesity  INTERVENTION:  Provide Ensure Max Protein po once daily, each supplement provides 150 kcal and 30 grams of protein.  NUTRITION DIAGNOSIS:   Increased nutrient needs related to catabolic illness(COPD) as evidenced by estimated needs.  GOAL:   Patient will meet greater than or equal to 90% of their needs  MONITOR:   PO intake, Supplement acceptance, Weight trends, Skin, I & O's  REASON FOR ASSESSMENT:   Consult Assessment of nutrition requirement/status  ASSESSMENT:   74 year old female with PMHx of depression, anxiety, HLD, HTN, diverticulosis admitted with acute exacerbation of COPD, acute confusion, AKI, shingles.   Attempted to call patient over the phone but she was unable to answer. No meal documentation completed at this time. Weights in chart appear to be trending up. Patient was 93 kg in 2019, 105.8 kg on 09/11/2018 and is now 116 kg. Patient has increased nutrient needs related to catabolic nature of COPD.  Medications reviewed and include: Solu-Medrol 60 mg Q12hrs IV today followed by prednisone 40 mg daily PO, NS at 100 mL/hr, ceftriaxone.  Labs reviewed.  NUTRITION - FOCUSED PHYSICAL EXAM:  Unable to complete at this time as RD is working remotely.  Diet Order:   Diet Order            Diet Heart Room service appropriate? Yes; Fluid consistency: Thin  Diet effective now             EDUCATION NEEDS:   No education needs have been identified at this time  Skin:  Skin Assessment: Reviewed RN Assessment  Last BM:  07/13/2019 per chart  Height:   Ht Readings from Last 1 Encounters:  07/14/19 5\' 6"  (1.676 m)   Weight:   Wt Readings from Last 1 Encounters:  07/14/19 116 kg   Ideal Body Weight:  59.1 kg  BMI:  Body mass index is 41.28 kg/m.  Estimated Nutritional Needs:   Kcal:  2100-2300  Protein:  115 grams  Fluid:  >/= 2 L/day  Jacklynn Barnacle, MS,  RD, LDN Pager number available on Amion

## 2019-07-16 ENCOUNTER — Inpatient Hospital Stay: Payer: Medicare Other

## 2019-07-16 DIAGNOSIS — B029 Zoster without complications: Secondary | ICD-10-CM

## 2019-07-16 LAB — BASIC METABOLIC PANEL
Anion gap: 8 (ref 5–15)
BUN: 52 mg/dL — ABNORMAL HIGH (ref 8–23)
CO2: 23 mmol/L (ref 22–32)
Calcium: 8.6 mg/dL — ABNORMAL LOW (ref 8.9–10.3)
Chloride: 104 mmol/L (ref 98–111)
Creatinine, Ser: 1.12 mg/dL — ABNORMAL HIGH (ref 0.44–1.00)
GFR calc Af Amer: 56 mL/min — ABNORMAL LOW (ref >60)
GFR calc non Af Amer: 48 mL/min — ABNORMAL LOW (ref >60)
Glucose, Bld: 164 mg/dL — ABNORMAL HIGH (ref 70–99)
Potassium: 4.1 mmol/L (ref 3.5–5.1)
Sodium: 135 mmol/L (ref 135–145)

## 2019-07-16 LAB — RPR: RPR Ser Ql: NONREACTIVE

## 2019-07-16 NOTE — Evaluation (Signed)
Physical Therapy Evaluation Patient Details Name: Rachel [name] MRN: [id number] DOB: 07-12-1945 Today's Date: 07/16/2019   History of Present Illness  74 y.o. female with medical history significant for COPD on home oxygen at 2 L depression, and hearing impairment with a cochlear implant who was brought to the emergency room because of acute confusion.  Per daughter, pt has been declining for several months with increased amount of SOB during activity. No acute neurological findings.  Patient found to have shingles.  Clinical Impression  Pt was eager to work with PT, but did have some functional limitations and remains weaker and more limited to her baseline.  She was able to do some ambulation in the room (Even briefly w/o UEs) but was unsteady and overall displayed need to have UE support and remained very slow and guarded.  Pt on 2L O2 24/7 at baseline, on 3L during today's session and remained in the 80s t/o all activity (down to 83% with ambulation).  Pt overall showed good effort but is weaker than her baseline and would not be safe to return home at this point.  Recommending STR to get back to functional and baseline level.    Follow Up Recommendations SNF    Equipment Recommendations  Rolling walker with 5" wheels    Recommendations for Other Services       Precautions / Restrictions Precautions Precautions: Fall(shingles isolation) Restrictions Weight Bearing Restrictions: No      Mobility  Bed Mobility Overal bed mobility: Needs Assistance Bed Mobility: Supine to Sit Rolling: Min assist         General bed mobility comments: Pt needed only light HHA to assist to EOB  Transfers Overall transfer level: Needs assistance Equipment used: Rolling walker (2 wheeled);1 person hand held assist Transfers: Sit to/from Stand Sit to Stand: Min assist(sit to standing X 3 t/o session heavy UE use, light assist)         General transfer comment: Pt again reaching for UE  support (AD, furniture, HHA) but generally did well and displayed good safety awareness  Ambulation/Gait Ambulation/Gait assistance: Min guard Gait Distance (Feet): 35 Feet Assistive device: Rolling walker (2 wheeled);2 person hand held assist       General Gait Details: Pt was able to ambulate slowly and cautiously in the room.  She used walker initially and then we transitioned to IV pole/HHA.  Some mild unsteadiness, but no overt LOBs.  Stairs            Wheelchair Mobility    Modified Rankin (Stroke Patients Only)       Balance Overall balance assessment: Needs assistance Sitting-balance support: Feet supported;No upper extremity supported Sitting balance-Leahy Scale: Good Sitting balance - Comments: Pt able to maintain sitting EOB safely/confidently   Standing balance support: No upper extremity supported Standing balance-Leahy Scale: Fair Standing balance comment: Pt quite guarded w/o UE support, reaching for some UE support t/o most of the effort                              Pertinent Vitals/Pain Pain Assessment: 0-10 Pain Score: 2  Pain Location: Shingles rash on R chest    Home Living Family/patient expects to be discharged to:: Private residence Living Arrangements: Alone Available Help at Discharge: Family(sister calls or stops in daily) Type of Home: House Home Access: Stairs to enter Entrance Stairs-Rails: (yes) Entrance Stairs-Number of Steps: 4 Home Layout: One level Home Equipment: Shower  seat      Prior Function Level of Independence: Needs assistance   Gait / Transfers Assistance Needed: Patient reports using no DME for household distances.  ADL's / Homemaking Assistance Needed: Patient's family and neighbors provided meals.        Hand Dominance   Dominant Hand: Right    Extremity/Trunk Assessment   Upper Extremity Assessment Upper Extremity Assessment: Generalized weakness    Lower Extremity Assessment Lower  Extremity Assessment: Generalized weakness;Overall WFL for tasks assessed       Communication   Communication: HOH(cochlear implants, has sound amplifier mic)  Cognition Arousal/Alertness: Awake/alert Behavior During Therapy: WFL for tasks assessed/performed Overall Cognitive Status: Within Functional Limits for tasks assessed                                        General Comments      Exercises     Assessment/Plan    PT Assessment Patient needs continued PT services  PT Problem List Decreased strength;Decreased range of motion;Decreased activity tolerance;Decreased balance;Decreased knowledge of use of DME;Decreased safety awareness;Decreased mobility;Cardiopulmonary status limiting activity       PT Treatment Interventions DME instruction;Gait training;Stair training;Functional mobility training;Therapeutic activities;Therapeutic exercise;Balance training;Neuromuscular re-education;Patient/family education    PT Goals (Current goals can be found in the Care Plan section)  Acute Rehab PT Goals Patient Stated Goal: get stronger to get back home PT Goal Formulation: With patient Time For Goal Achievement: 07/30/19 Potential to Achieve Goals: Good    Frequency Min 2X/week   Barriers to discharge        Co-evaluation               AM-PAC PT "6 Clicks" Mobility  Outcome Measure Help needed turning from your back to your side while in a flat bed without using bedrails?: A Little Help needed moving from lying on your back to sitting on the side of a flat bed without using bedrails?: A Little Help needed moving to and from a bed to a chair (including a wheelchair)?: A Little Help needed standing up from a chair using your arms (e.g., wheelchair or bedside chair)?: A Little Help needed to walk in hospital room?: A Lot Help needed climbing 3-5 steps with a railing? : A Lot 6 Click Score: 16    End of Session Equipment Utilized During Treatment: Gait  belt;Oxygen(on 3L t/o session, sats in the high to mid 74s - RN aware) Activity Tolerance: Patient limited by pain Patient left: with chair alarm set;with call bell/phone within reach Nurse Communication: Mobility status(O2 sats) PT Visit Diagnosis: Muscle weakness (generalized) (M62.81);Difficulty in walking, not elsewhere classified (R26.2);Unsteadiness on feet (R26.81)    Time: [id number] PT Time Calculation (min) (ACUTE ONLY): 41 min   Charges:   PT Evaluation $PT Eval Low Complexity: 1 Low PT Treatments $Gait Training: 8-22 mins $Therapeutic Activity: 8-22 mins        Kreg Shropshire, DPT 07/16/2019, 1:05 PM

## 2019-07-16 NOTE — NC FL2 (Signed)
[place] LEVEL OF CARE SCREENING TOOL     IDENTIFICATION  Patient Name: Rachel [name] Birthdate: March 23, 1946 Sex: female Admission Date (Current Location): 07/14/2019  [place] and [place] Number:  Engineering geologist and Address:  [facility], [address] [place], [place] [address]      Provider Number: [id number]  Attending Physician Name and Address:  Jennye Boroughs, MD  Relative Name and Phone Number:  Anders Simmonds [phone]    Current Level of Care: Hospital Recommended Level of Care: [place] Prior Approval Number:    Date Approved/Denied:   PASRR Number: [id number]  Discharge Plan: SNF    Current Diagnoses: Patient Active Problem List   Diagnosis Date Noted  . Shingles 07/15/2019  . Acute confusion 07/15/2019  . AKI (acute kidney injury) ([place]) 07/15/2019  . COPD with acute exacerbation ([place]) 07/14/2019  . Acute on chronic respiratory failure with hypoxemia ([place]) 09/10/2018  . Shortness of breath 12/10/2017  . Smoker 12/10/2017  . Essential hypertension 12/10/2017  . Morbid obesity ([place]) 12/10/2017  . Bilateral hip pain 12/10/2017  . Hyperlipidemia 12/10/2017  . Hard of hearing 12/10/2017  . Facial abscess 12/24/2015  . Open wound of left lower leg 12/29/2013  . Leg laceration 12/23/2013  . Open wound of knee, leg (except thigh), and ankle, complicated 0000000  . Wound of right leg 01/13/2013    Orientation RESPIRATION BLADDER Height & Weight     Self, Place  O2(2 liters continuous) Continent Weight: 255 lb 11.7 oz (116 kg) Height:  5\' 6"  (167.6 cm)  BEHAVIORAL SYMPTOMS/MOOD NEUROLOGICAL BOWEL NUTRITION STATUS      Incontinent Diet(heart healthy thin liquids)  AMBULATORY STATUS COMMUNICATION OF NEEDS Skin   Limited Assist Verbally Normal                       Personal Care Assistance Level of Assistance  Bathing, Dressing Bathing Assistance: Limited  assistance   Dressing Assistance: Limited assistance     Functional Limitations Info  Hearing(cochlear implant)   Hearing Info: Impaired      SPECIAL CARE FACTORS FREQUENCY  PT (By licensed PT), OT (By licensed OT)     PT Frequency: 5x week OT Frequency: 5x week            Contractures Contractures Info: Not present    Additional Factors Info  Code Status, Allergies Code Status Info: Full code Allergies Info: No known allergies           Current Medications (07/16/2019):  This is the current hospital active medication list Current Facility-Administered Medications  Medication Dose Route Frequency Provider Last Rate Last Admin  . 0.9 %  sodium chloride infusion   Intravenous Continuous Athena Masse, MD 100 mL/hr at 07/15/19 0358 New Bag at 07/15/19 0358  . acetaminophen (TYLENOL) tablet 650 mg  650 mg Oral Q6H PRN Jennye Boroughs, MD      . cefTRIAXone (ROCEPHIN) 1 g in sodium chloride 0.9 % 100 mL IVPB  1 g Intravenous Q24H Athena Masse, MD   Stopped at 07/16/19 [phone]  . diltiazem (CARDIZEM) tablet 120 mg  120 mg Oral TID Athena Masse, MD   120 mg at 07/16/19 1026  . enoxaparin (LOVENOX) injection 40 mg  40 mg Subcutaneous Q12H Judd Gaudier V, MD   40 mg at 07/16/19 1026  . hydrALAZINE (APRESOLINE) tablet 25 mg  25 mg Oral TID Athena Masse, MD   25  mg at 07/16/19 1027  . hydrocerin (EUCERIN) cream   Topical BID Jennye Boroughs, MD   Given at 07/16/19 0830  . morphine 2 MG/ML injection 2 mg  2 mg Intravenous Q4H PRN Jennye Boroughs, MD      . oxyCODONE (Oxy IR/ROXICODONE) immediate release tablet 5 mg  5 mg Oral Q6H PRN Jennye Boroughs, MD   5 mg at 07/16/19 1027  . pravastatin (PRAVACHOL) tablet 10 mg  10 mg Oral q1800 Athena Masse, MD   10 mg at 07/15/19 1915  . predniSONE (DELTASONE) tablet 40 mg  40 mg Oral Q breakfast Athena Masse, MD   40 mg at 07/16/19 1027  . protein supplement (ENSURE MAX) liquid  11 oz Oral Daily Jennye Boroughs, MD      .  valACYclovir (VALTREX) tablet 1,000 mg  1,000 mg Oral BID Jennye Boroughs, MD   1,000 mg at 07/16/19 1027  . zolpidem (AMBIEN) tablet 5 mg  5 mg Oral QHS Lang Snow, NP   5 mg at 07/15/19 2303     Discharge Medications: Please see discharge summary for a list of discharge medications.  Relevant Imaging Results:  Relevant Lab Results:   Additional Information On contact for shingles  Stephaney Steven, Gardiner Rhyme, LCSW

## 2019-07-16 NOTE — TOC Progression Note (Signed)
Transition of Care [facility]) - Progression Note    Patient Details  Name: NIOMA PRUSHA MRN: [id number] Date of Birth: Feb 24, 1946  [facility]) CM/SW Contact  Arthuro Canelo, Gardiner Rhyme, LCSW Phone Number: 07/16/2019, 11:55 AM  Clinical Narrative:  Sister and son feel pt needs to go to a rehab and then transition to ALF. She should not be home alone and is not doing well there, according to both of them. Encouraged them to talk with pt due to she will need to agree to this. This worker will also talk with pt today regarding this option. Have completed FL2 and started bed search in case this option is agreed upon.     Expected Discharge Plan: [place] Barriers to Discharge: Continued Medical Work up  Expected Discharge Plan and Services Expected Discharge Plan: [place] In-house Referral: Clinical Social Work Discharge Planning Services: NA Post Acute Care Choice: [place] Living arrangements for the past 2 months: Single Family Home                                       Social Determinants of Health (SDOH) Interventions    Readmission Risk Interventions No flowsheet data found.

## 2019-07-16 NOTE — Progress Notes (Signed)
Progress Note    Rachel [name]  [id number] DOB: [birth year]  DOA: 07/14/2019 PCP: Albina Billet, MD      Brief Narrative:    Medical records reviewed and are as summarized below:  Rachel [name] is an 74 y.o. female with medical history significant for COPD on home oxygen at 2 L depression, and hearing impairment with a cochlear implant who was brought to the emergency room because of acute confusion.  At baseline, patient is homebound and she and her neighbors check in on her every day and bring food for her.  Her sister last saw her about 3 days ago prior to admission and at that time, her mental status was normal. Apparently on arrival of EMS, she had urinated on herself on the couch and appeared to be staring blankly and they had to lift her into the EMS vehicle.    Reportedly, her condition has been declining for several months and she has been having increasing shortness of breath with exertion even walking from room to room.     There have been some concerns about her continuing to live on her own.    Was also found to have a rash on her chest. On arrival of EMS she was apparently satting at 90% on 2 L and she was increased to 4 L      Assessment/Plan:   Principal Problem:   Acute confusion Active Problems:   Essential hypertension   Morbid obesity (HCC)   Acute on chronic respiratory failure with hypoxemia (HCC)   COPD with acute exacerbation ([place])   Shingles   AKI (acute kidney injury) ([place])   Acute confusional state/acute toxic metabolic encephalopathy: Improved.  Patient has been seen by neurologist who recommended repeat CT scan of the head tomorrow.  Mild AKI: Improving.  Repeat BMP tomorrow.  Herpes zoster infection: Continue Valtrex.  Analgesics as needed for pain.  Acute on chronic hypoxemic respiratory failure: She is down to 3 L/min oxygen.  Continue oxygen via nasal cannula.  COPD with acute exacerbation: Continue steroids and  bronchodilators.  Discontinue IV Rocephin.  Hypertension: Continue antihypertensives   Body mass index is 41.28 kg/m.  (Morbid obesity)   Family Communication/Anticipated D/C date and plan/Code Status   DVT prophylaxis: Lovenox Code Status: Full code Family Communication: Plan discussed with patient Disposition Plan: Patient is from home.  She may be discharged to SNF.  She will need repeat CT head tomorrow prior to discharge.  Possible discharge in 1 to 2 days.  Follow-up with social worker to assist with disposition.     Subjective:   She complains of pain at the rash site on the right upper chest, axilla and upper back  Objective:    Vitals:   07/15/19 0321 07/15/19 1206 07/15/19 2302 07/16/19 0807  BP: (!) 150/61 (!) 141/84 113/65 137/60  Pulse: 91 98 86 68  Resp: 14 18 20 20   Temp: 98.2 F (36.8 C) 98.1 F (36.7 C) 98.6 F (37 C) 98.1 F (36.7 C)  TempSrc:  Oral    SpO2: 93% 97% 96% 94%  Weight:      Height:        Intake/Output Summary (Last 24 hours) at 07/16/2019 1131 Last data filed at 07/16/2019 0600 Gross per 24 hour  Intake 700.94 ml  Output --  Net 700.94 ml   Filed Weights   07/14/19 2030  Weight: 116 kg    Exam:  GEN: NAD SKIN: Erythematous vesicular  rash on right upper chest, right breast, right axilla and right upper back EYES: PERRLA ENT: MMM, bilateral hearing impairment CV: RRR PULM: CTA B ABD: soft, obese, NT, +BS CNS: AAO x 3, non focal EXT: No edema or tenderness   Data Reviewed:   I have personally reviewed following labs and imaging studies:  Labs: Labs show the following:   Basic Metabolic Panel: Recent Labs  Lab 07/14/19 2034 07/14/19 2034 07/15/19 1430 07/16/19 0829  NA 136  --  133* 135  K 4.3   < > 4.1 4.1  CL 99  --  100 104  CO2 25  --  24 23  GLUCOSE 125*  --  151* 164*  BUN 41*  --  49* 52*  CREATININE 1.26*  --  1.29* 1.12*  CALCIUM 9.3  --  9.0 8.6*   < > = values in this interval not displayed.    GFR Estimated Creatinine Clearance: 57 mL/min (A) (by C-G formula based on SCr of 1.12 mg/dL (H)). Liver Function Tests: Recent Labs  Lab 07/14/19 2034  AST 64*  ALT 31  ALKPHOS 75  BILITOT 0.8  PROT 8.3*  ALBUMIN 4.4   No results for input(s): LIPASE, AMYLASE in the last 168 hours. No results for input(s): AMMONIA in the last 168 hours. Coagulation profile No results for input(s): INR, PROTIME in the last 168 hours.  CBC: Recent Labs  Lab 07/14/19 2034 07/15/19 0036  WBC 5.2 5.4  HGB 15.4* 14.5  HCT 45.9 43.0  MCV 92.5 91.7  PLT 131* 114*   Cardiac Enzymes: No results for input(s): CKTOTAL, CKMB, CKMBINDEX, TROPONINI in the last 168 hours. BNP (last 3 results) No results for input(s): PROBNP in the last 8760 hours. CBG: No results for input(s): GLUCAP in the last 168 hours. D-Dimer: No results for input(s): DDIMER in the last 72 hours. Hgb A1c: No results for input(s): HGBA1C in the last 72 hours. Lipid Profile: No results for input(s): CHOL, HDL, LDLCALC, TRIG, CHOLHDL, LDLDIRECT in the last 72 hours. Thyroid function studies: Recent Labs    07/15/19 1430  TSH 1.421   Anemia work up: Recent Labs    07/15/19 1430  VITAMINB12 269   Sepsis Labs: Recent Labs  Lab 07/14/19 2034 07/15/19 0036  WBC 5.2 5.4  LATICACIDVEN 1.7  --     Microbiology Recent Results (from the past 240 hour(s))  SARS CORONAVIRUS 2 (TAT 6-24 HRS) Nasopharyngeal Nasopharyngeal Swab     Status: None   Collection Time: 07/14/19 11:19 PM   Specimen: Nasopharyngeal Swab  Result Value Ref Range Status   SARS Coronavirus 2 NEGATIVE NEGATIVE Final    Comment: (NOTE) SARS-CoV-2 target nucleic acids are NOT DETECTED. The SARS-CoV-2 RNA is generally detectable in upper and lower respiratory specimens during the acute phase of infection. Negative results do not preclude SARS-CoV-2 infection, do not rule out co-infections with other pathogens, and should not be used as the sole  basis for treatment or other patient management decisions. Negative results must be combined with clinical observations, patient history, and epidemiological information. The expected result is Negative. Fact Sheet for Patients: SugarRoll.be Fact Sheet for Healthcare Providers: https://www.woods-mathews.com/ This test is not yet approved or cleared by the [place] FDA and  has been authorized for detection and/or diagnosis of SARS-CoV-2 by FDA under an Emergency Use Authorization (EUA). This EUA will remain  in effect (meaning this test can be used) for the duration of the COVID-19 declaration under Section 56 4(b)(1)  of the Act, 21 U.S.C. section 360bbb-3(b)(1), unless the authorization is terminated or revoked sooner. Performed at [facility], [place] [address], [place], [place] [address]   Culture, blood (routine x 2) Call MD if unable to obtain prior to antibiotics being given     Status: None (Preliminary result)   Collection Time: 07/15/19 12:36 AM   Specimen: BLOOD  Result Value Ref Range Status   Specimen Description BLOOD RIGHT HAND  Final   Special Requests   Final    BOTTLES DRAWN AEROBIC AND ANAEROBIC Blood Culture adequate volume   Culture   Final    NO GROWTH 1 DAY Performed at [facility], [address], [place], [place] [address]    Report Status PENDING  Incomplete  Culture, blood (routine x 2) Call MD if unable to obtain prior to antibiotics being given     Status: None (Preliminary result)   Collection Time: 07/15/19 12:36 AM   Specimen: BLOOD  Result Value Ref Range Status   Specimen Description BLOOD LEFT [facility]  Final   Special Requests   Final    BOTTLES DRAWN AEROBIC AND ANAEROBIC Blood Culture adequate volume   Culture   Final    NO GROWTH 1 DAY Performed at [facility], [address] [place], [place] [address]    Report Status PENDING  Incomplete    Procedures and diagnostic  studies:  EEG  Result Date: 07/15/2019 Alexis Goodell, MD     07/15/2019  6:22 PM ELECTROENCEPHALOGRAM REPORT Patient: LUZMARIA KRONENWETTER       Room #: 128A-AA EEG No. ID: [id number] Age: 74 y.o.        Sex: female Requesting Physician: Mal Misty Report Date:  07/15/2019       Interpreting Physician: Alexis Goodell History: CRISTA DEAKIN is an 74 y.o. female with altered mental status Medications: Rocephin, Cardizem, Solumedrol, Pravachol, Valtrex, Conditions of Recording:  This is a 21 channel routine scalp EEG performed with bipolar and monopolar montages arranged in accordance to the international 10/20 system of electrode placement. One channel was dedicated to EKG recording. The patient is in the awake state. Description:  The background activity is slow and poorly organiized.  It consists low voltage, irregular activity along the delta-theta continuum.  This activity is diffusely organized and continuous.  No epileptiform activity is noted. Hyperventilation was not performed.  Intermittent photic stimulation was performed but failed to illicit any change in the tracing. IMPRESSION: This is an abnormal EEG secondary to general background slowing.  This finding may be seen with a diffuse disturbance that is etiologically nonspecific, but may include a metabolic encephalopathy, among other possibilities.  No epileptiform activity was noted.  Alexis Goodell, MD Neurology [phone] 07/15/2019, 6:13 PM   CT HEAD WO CONTRAST  Result Date: 07/15/2019 CLINICAL DATA:  Ataxia, stroke suspected EXAM: CT HEAD WITHOUT CONTRAST TECHNIQUE: Contiguous axial images were obtained from the base of the skull through the vertex without intravenous contrast. COMPARISON:  May 18, 2011 FINDINGS: Brain: No evidence of acute territorial infarction, hemorrhage, hydrocephalus,extra-axial collection or mass lesion/mass effect. Somewhat limited due to large amount of overlying artifact likely secondary from cochlear implant. There  is dilatation the ventricles and sulci consistent with age-related atrophy. Low-attenuation changes in the deep white matter consistent with small vessel ischemia. Vascular: No hyperdense vessel or unexpected calcification. Skull: The skull is intact. No fracture or focal lesion identified. Sinuses/Orbits: The visualized paranasal sinuses and mastoid air cells are clear. The  orbits and globes intact. Other: None IMPRESSION: No acute intracranial abnormality. Significant over artifact from overlying right parietal cochlear implant. Findings consistent with age related atrophy and chronic small vessel ischemia Electronically Signed   By: Prudencio Pair M.D.   On: 07/15/2019 01:12   DG Chest Port 1 View  Result Date: 07/16/2019 CLINICAL DATA:  Evaluate pneumonia. Diaphoresis noted. EXAM: PORTABLE CHEST 1 VIEW COMPARISON:  07/14/2019 FINDINGS: Normal heart size. Aortic atherosclerosis. Scattered areas of scarring and or atelectasis are again noted and appears similar to previous exam. No pleural effusion or edema. IMPRESSION: Unchanged appearance of bilateral areas of atelectasis and or scarring. Electronically Signed   By: Kerby Moors M.D.   On: 07/16/2019 09:14   DG Chest Portable 1 View  Result Date: 07/14/2019 CLINICAL DATA:  Shortness of breath. EXAM: PORTABLE CHEST 1 VIEW COMPARISON:  09/09/2018 FINDINGS: Again noted is cardiomegaly with findings suspicious for underlying interstitial lung disease. Scattered bilateral areas of scarring and atelectasis are again noted and are essentially stable from prior study. There is no significant pleural effusion or pneumothorax. There is no acute displaced fracture. Aortic calcifications are noted. IMPRESSION: Again noted are chronic findings bilaterally as detailed above which may represent underlying interstitial lung disease. A superimposed infectious process is difficult to entirely exclude on this exam. Electronically Signed   By: Constance Holster M.D.   On:  07/14/2019 20:59    Medications:   . diltiazem  120 mg Oral TID  . enoxaparin (LOVENOX) injection  40 mg Subcutaneous Q12H  . hydrALAZINE  25 mg Oral TID  . hydrocerin   Topical BID  . pravastatin  10 mg Oral q1800  . predniSONE  40 mg Oral Q breakfast  . Ensure Max Protein  11 oz Oral Daily  . valACYclovir  1,000 mg Oral BID  . zolpidem  5 mg Oral QHS   Continuous Infusions: . sodium chloride 100 mL/hr at 07/15/19 0358  . cefTRIAXone (ROCEPHIN)  IV Stopped (07/16/19 0042)     LOS: 1 day   Avonna Iribe  [facility]     07/16/2019, 11:31 AM

## 2019-07-16 NOTE — Progress Notes (Addendum)
Subjective: Reports still feeling fatigued with some SOB but otherwise no new neurological complaints.    Objective: Current vital signs: BP 137/60 (BP Location: Left Arm)   Pulse 68   Temp 98.1 F (36.7 C)   Resp 20   Ht '5\' 6"'$  (1.676 m)   Wt 116 kg   SpO2 94%   BMI 41.28 kg/m  Vital signs in last 24 hours: Temp:  [98.1 F (36.7 C)-98.6 F (37 C)] 98.1 F (36.7 C) (03/05 0807) Pulse Rate:  [68-98] 68 (03/05 0807) Resp:  [18-20] 20 (03/05 0807) BP: (113-141)/(60-84) 137/60 (03/05 0807) SpO2:  [94 %-97 %] 94 % (03/05 0807)  Intake/Output from previous day: 03/04 0701 - 03/05 0700 In: 700.9 [I.V.:600; IV Piggyback:100.9] Out: -  Intake/Output this shift: No intake/output data recorded. Nutritional status:  Diet Order            Diet Heart Room service appropriate? Yes; Fluid consistency: Thin  Diet effective now              Neurologic Exam: Mental Status: Alert, oriented.  Speech fluent without evidence of aphasia.  Able to follow 3 step commands without difficulty. Cranial Nerves: II: Visual fields grossly normal, pupils equal, round, reactive to light and accommodation III,IV, VI: ptosis not present, extra-ocular motions intact bilaterally V,VII: smile symmetric, facial light touch sensation normal bilaterally VIII: HOH bilaterally IX,X: gag reflex present XI: bilateral shoulder shrug XII: midline tongue extension Motor: Lifts both upper extremities strongly off the bed.  Has some generalized lower extremity weakness but difficult to test due to seated position.   Sensory: Pinprick and light touch intact throughout, bilaterally   Lab Results: Basic Metabolic Panel: Recent Labs  Lab 07/14/19 2034 07/15/19 1430 07/16/19 0829  NA 136 133* 135  K 4.3 4.1 4.1  CL 99 100 104  CO2 '25 24 23  '$ GLUCOSE 125* 151* 164*  BUN 41* 49* 52*  CREATININE 1.26* 1.29* 1.12*  CALCIUM 9.3 9.0 8.6*    Liver Function Tests: Recent Labs  Lab 07/14/19 2034  AST 64*   ALT 31  ALKPHOS 75  BILITOT 0.8  PROT 8.3*  ALBUMIN 4.4   No results for input(s): LIPASE, AMYLASE in the last 168 hours. No results for input(s): AMMONIA in the last 168 hours.  CBC: Recent Labs  Lab 07/14/19 2034 07/15/19 0036  WBC 5.2 5.4  HGB 15.4* 14.5  HCT 45.9 43.0  MCV 92.5 91.7  PLT 131* 114*    Cardiac Enzymes: No results for input(s): CKTOTAL, CKMB, CKMBINDEX, TROPONINI in the last 168 hours.  Lipid Panel: No results for input(s): CHOL, TRIG, HDL, CHOLHDL, VLDL, LDLCALC in the last 168 hours.  CBG: No results for input(s): GLUCAP in the last 168 hours.  Microbiology: Results for orders placed or performed during the hospital encounter of 07/14/19  SARS CORONAVIRUS 2 (TAT 6-24 HRS) Nasopharyngeal Nasopharyngeal Swab     Status: None   Collection Time: 07/14/19 11:19 PM   Specimen: Nasopharyngeal Swab  Result Value Ref Range Status   SARS Coronavirus 2 NEGATIVE NEGATIVE Final    Comment: (NOTE) SARS-CoV-2 target nucleic acids are NOT DETECTED. The SARS-CoV-2 RNA is generally detectable in upper and lower respiratory specimens during the acute phase of infection. Negative results do not preclude SARS-CoV-2 infection, do not rule out co-infections with other pathogens, and should not be used as the sole basis for treatment or other patient management decisions. Negative results must be combined with clinical observations, patient history, and  epidemiological information. The expected result is Negative. Fact Sheet for Patients: SugarRoll.be Fact Sheet for Healthcare Providers: https://www.woods-mathews.com/ This test is not yet approved or cleared by the [place] FDA and  has been authorized for detection and/or diagnosis of SARS-CoV-2 by FDA under an Emergency Use Authorization (EUA). This EUA will remain  in effect (meaning this test can be used) for the duration of the COVID-19 declaration under Section 56  4(b)(1) of the Act, 21 U.S.C. section 360bbb-3(b)(1), unless the authorization is terminated or revoked sooner. Performed at [facility], [place] [address] [place], [place] [address]   Culture, blood (routine x 2) Call MD if unable to obtain prior to antibiotics being given     Status: None (Preliminary result)   Collection Time: 07/15/19 12:36 AM   Specimen: BLOOD  Result Value Ref Range Status   Specimen Description BLOOD RIGHT HAND  Final   Special Requests   Final    BOTTLES DRAWN AEROBIC AND ANAEROBIC Blood Culture adequate volume   Culture   Final    NO GROWTH 1 DAY Performed at [facility], [address] [place], [place] [address]    Report Status PENDING  Incomplete  Culture, blood (routine x 2) Call MD if unable to obtain prior to antibiotics being given     Status: None (Preliminary result)   Collection Time: 07/15/19 12:36 AM   Specimen: BLOOD  Result Value Ref Range Status   Specimen Description BLOOD LEFT [facility]  Final   Special Requests   Final    BOTTLES DRAWN AEROBIC AND ANAEROBIC Blood Culture adequate volume   Culture   Final    NO GROWTH 1 DAY Performed at [facility], [address] [place], [place] [address]    Report Status PENDING  Incomplete    Coagulation Studies: No results for input(s): LABPROT, INR in the last 72 hours.  Imaging: EEG  Result Date: 07/15/2019 Alexis Goodell, MD     07/15/2019  6:22 PM ELECTROENCEPHALOGRAM REPORT Patient: Rachel [name]       Room #: 128A-AA EEG No. ID: [id number] Age: 74 y.o.        Sex: female Requesting Physician: Mal Misty Report Date:  07/15/2019       Interpreting Physician: Alexis Goodell History: Rachel [name] is an 74 y.o. female with altered mental status Medications: Rocephin, Cardizem, Solumedrol, Pravachol, Valtrex, Conditions of Recording:  This is a 21 channel routine scalp EEG performed with bipolar and monopolar montages arranged in accordance to the international 10/20 system  of electrode placement. One channel was dedicated to EKG recording. The patient is in the awake state. Description:  The background activity is slow and poorly organiized.  It consists low voltage, irregular activity along the delta-theta continuum.  This activity is diffusely organized and continuous.  No epileptiform activity is noted. Hyperventilation was not performed.  Intermittent photic stimulation was performed but failed to illicit any change in the tracing. IMPRESSION: This is an abnormal EEG secondary to general background slowing.  This finding may be seen with a diffuse disturbance that is etiologically nonspecific, but may include a metabolic encephalopathy, among other possibilities.  No epileptiform activity was noted.  Alexis Goodell, MD Neurology [phone] 07/15/2019, 6:13 PM   CT HEAD WO CONTRAST  Result Date: 07/15/2019 CLINICAL DATA:  Ataxia, stroke suspected EXAM: CT HEAD WITHOUT CONTRAST TECHNIQUE: Contiguous axial images were obtained from the base of the skull through the vertex without intravenous contrast. COMPARISON:  May 18, 2011 FINDINGS: Brain: No evidence of acute territorial infarction, hemorrhage, hydrocephalus,extra-axial collection or mass lesion/mass effect. Somewhat limited due to large amount of overlying artifact likely secondary from cochlear implant. There is dilatation the ventricles and sulci consistent with age-related atrophy. Low-attenuation changes in the deep white matter consistent with small vessel ischemia. Vascular: No hyperdense vessel or unexpected calcification. Skull: The skull is intact. No fracture or focal lesion identified. Sinuses/Orbits: The visualized paranasal sinuses and mastoid air cells are clear. The orbits and globes intact. Other: None IMPRESSION: No acute intracranial abnormality. Significant over artifact from overlying right parietal cochlear implant. Findings consistent with age related atrophy and chronic small vessel ischemia  Electronically Signed   By: Prudencio Pair M.D.   On: 07/15/2019 01:12   DG Chest Port 1 View  Result Date: 07/16/2019 CLINICAL DATA:  Evaluate pneumonia. Diaphoresis noted. EXAM: PORTABLE CHEST 1 VIEW COMPARISON:  07/14/2019 FINDINGS: Normal heart size. Aortic atherosclerosis. Scattered areas of scarring and or atelectasis are again noted and appears similar to previous exam. No pleural effusion or edema. IMPRESSION: Unchanged appearance of bilateral areas of atelectasis and or scarring. Electronically Signed   By: Kerby Moors M.D.   On: 07/16/2019 09:14   DG Chest Portable 1 View  Result Date: 07/14/2019 CLINICAL DATA:  Shortness of breath. EXAM: PORTABLE CHEST 1 VIEW COMPARISON:  09/09/2018 FINDINGS: Again noted is cardiomegaly with findings suspicious for underlying interstitial lung disease. Scattered bilateral areas of scarring and atelectasis are again noted and are essentially stable from prior study. There is no significant pleural effusion or pneumothorax. There is no acute displaced fracture. Aortic calcifications are noted. IMPRESSION: Again noted are chronic findings bilaterally as detailed above which may represent underlying interstitial lung disease. A superimposed infectious process is difficult to entirely exclude on this exam. Electronically Signed   By: Constance Holster M.D.   On: 07/14/2019 20:59    Medications:  I have reviewed the patient's current medications. Scheduled: . diltiazem  120 mg Oral TID  . enoxaparin (LOVENOX) injection  40 mg Subcutaneous Q12H  . hydrALAZINE  25 mg Oral TID  . hydrocerin   Topical BID  . pravastatin  10 mg Oral q1800  . predniSONE  40 mg Oral Q breakfast  . Ensure Max Protein  11 oz Oral Daily  . valACYclovir  1,000 mg Oral BID  . zolpidem  5 mg Oral QHS    Assessment/Plan: 74 y.o. female with a medical history significant forCOPD on home oxygen at 2 L depression, and hearing impairment with a cochlear implant who was brought to the  emergency room because of confusion. Unclear duration but less than 3 days.  Appears to be close to baseline at this time.  AKI remains.  Confusion likely secondary to a metabolic encephalopathy.  ESR, B12 and TSH, UDS are normal.  EEG only significant for slowing.   Recommendations: 1. Repeat head CT tomorrow without contrast    LOS: 1 day   Alexis Goodell, MD Neurology [phone] 07/16/2019  10:15 AM

## 2019-07-17 ENCOUNTER — Inpatient Hospital Stay: Payer: Medicare Other

## 2019-07-17 LAB — BASIC METABOLIC PANEL
Anion gap: 9 (ref 5–15)
BUN: 51 mg/dL — ABNORMAL HIGH (ref 8–23)
CO2: 23 mmol/L (ref 22–32)
Calcium: 8.5 mg/dL — ABNORMAL LOW (ref 8.9–10.3)
Chloride: 105 mmol/L (ref 98–111)
Creatinine, Ser: 0.93 mg/dL (ref 0.44–1.00)
GFR calc Af Amer: >60 mL/min (ref >60)
GFR calc non Af Amer: >60 mL/min (ref >60)
Glucose, Bld: 127 mg/dL — ABNORMAL HIGH (ref 70–99)
Potassium: 4.5 mmol/L (ref 3.5–5.1)
Sodium: 137 mmol/L (ref 135–145)

## 2019-07-17 NOTE — Progress Notes (Signed)
Progress Note    Rachel [name]  [id number] DOB: 06-Jun-1945  DOA: 07/14/2019 PCP: Albina Billet, MD      Brief Narrative:    Medical records reviewed and are as summarized below:  Rachel [name] is an 74 y.o. female with medical history significant for COPD on home oxygen at 2 L depression, and hearing impairment with a cochlear implant who was brought to the emergency room because of acute confusion.  At baseline, patient is homebound and she and her neighbors check in on her every day and bring food for her.  Her sister last saw her about 3 days ago prior to admission and at that time, her mental status was normal. Apparently on arrival of EMS, she had urinated on herself on the couch and appeared to be staring blankly and they had to lift her into the EMS vehicle.    Reportedly, her condition has been declining for several months and she has been having increasing shortness of breath with exertion even walking from room to room.     There have been some concerns about her continuing to live on her own.    Was also found to have a rash on her chest. On arrival of EMS she was apparently satting at 90% on 2 L and she was increased to 4 L      Assessment/Plan:   Principal Problem:   Acute confusion Active Problems:   Essential hypertension   Morbid obesity (HCC)   Acute on chronic respiratory failure with hypoxemia (HCC)   COPD with acute exacerbation ([place])   Shingles   AKI (acute kidney injury) ([place])   Acute confusional state/acute toxic metabolic encephalopathy: Improved.  Repeat CT head today.  Mild AKI: resolved   Herpes zoster infection: Continue Valtrex.  Analgesics as needed for pain.  Acute on chronic hypoxemic respiratory failure: She is down to 3 L/min oxygen.  Continue oxygen via nasal cannula.  Repeat chest x-ray did not show any acute abnormality.  Discontinued IV Rocephin.  COPD with acute exacerbation: Continue steroids and bronchodilators.     Hypertension: Continue antihypertensives   Body mass index is 41.28 kg/m.  (Morbid obesity)   Family Communication/Anticipated D/C date and plan/Code Status   DVT prophylaxis: Lovenox Code Status: Full code Family Communication: Plan discussed with patient Disposition Plan: Patient is from home.  She may be discharged to SNF.   Possible discharge in 1 to 2 days.  Follow-up with social worker to assist with disposition.     Subjective:   She still has pain at the rash site on her right upper chest, axilla and upper back.  No shortness of breath, cough or chest pain.  Objective:    Vitals:   07/16/19 0807 07/16/19 1512 07/16/19 2010 07/17/19 0753  BP: 137/60 130/61 (!) 139/57 (!) 150/64  Pulse: 68 (!) 56 (!) 56 78  Resp: 20 20  17   Temp: 98.1 F (36.7 C) 97.8 F (36.6 C) 97.7 F (36.5 C) 98 F (36.7 C)  TempSrc:    Oral  SpO2: 94% 95% 94% 95%  Weight:      Height:        Intake/Output Summary (Last 24 hours) at 07/17/2019 1215 Last data filed at 07/17/2019 1032 Gross per 24 hour  Intake 2800.69 ml  Output -  Net 2800.69 ml   Filed Weights   07/14/19 2030  Weight: 116 kg    Exam:  GEN: No acute distress SKIN: Erythematous  vesicular rash on right upper chest, right breast, right axilla and right upper back EYES: PERRLA ENT: MMM, bilateral hearing impairment CV: RRR PULM: Air entry adequate bilaterally, no wheezing or rales heard  ABD: soft, obese, NT, +BS CNS: AAO x 3, non focal EXT: No edema or tenderness   Data Reviewed:   I have personally reviewed following labs and imaging studies:  Labs: Labs show the following:   Basic Metabolic Panel: Recent Labs  Lab 07/14/19 2034 07/14/19 2034 07/15/19 1430 07/15/19 1430 07/16/19 0829 07/17/19 0707  NA 136  --  133*  --  135 137  K 4.3   < > 4.1   < > 4.1 4.5  CL 99  --  100  --  104 105  CO2 25  --  24  --  23 23  GLUCOSE 125*  --  151*  --  164* 127*  BUN 41*  --  49*  --  52* 51*   CREATININE 1.26*  --  1.29*  --  1.12* 0.93  CALCIUM 9.3  --  9.0  --  8.6* 8.5*   < > = values in this interval not displayed.   GFR Estimated Creatinine Clearance: 68.7 mL/min (by C-G formula based on SCr of 0.93 mg/dL). Liver Function Tests: Recent Labs  Lab 07/14/19 2034  AST 64*  ALT 31  ALKPHOS 75  BILITOT 0.8  PROT 8.3*  ALBUMIN 4.4   No results for input(s): LIPASE, AMYLASE in the last 168 hours. No results for input(s): AMMONIA in the last 168 hours. Coagulation profile No results for input(s): INR, PROTIME in the last 168 hours.  CBC: Recent Labs  Lab 07/14/19 2034 07/15/19 0036  WBC 5.2 5.4  HGB 15.4* 14.5  HCT 45.9 43.0  MCV 92.5 91.7  PLT 131* 114*   Cardiac Enzymes: No results for input(s): CKTOTAL, CKMB, CKMBINDEX, TROPONINI in the last 168 hours. BNP (last 3 results) No results for input(s): PROBNP in the last 8760 hours. CBG: No results for input(s): GLUCAP in the last 168 hours. D-Dimer: No results for input(s): DDIMER in the last 72 hours. Hgb A1c: No results for input(s): HGBA1C in the last 72 hours. Lipid Profile: No results for input(s): CHOL, HDL, LDLCALC, TRIG, CHOLHDL, LDLDIRECT in the last 72 hours. Thyroid function studies: Recent Labs    07/15/19 1430  TSH 1.421   Anemia work up: Recent Labs    07/15/19 1430  VITAMINB12 269   Sepsis Labs: Recent Labs  Lab 07/14/19 2034 07/15/19 0036  WBC 5.2 5.4  LATICACIDVEN 1.7  --     Microbiology Recent Results (from the past 240 hour(s))  SARS CORONAVIRUS 2 (TAT 6-24 HRS) Nasopharyngeal Nasopharyngeal Swab     Status: None   Collection Time: 07/14/19 11:19 PM   Specimen: Nasopharyngeal Swab  Result Value Ref Range Status   SARS Coronavirus 2 NEGATIVE NEGATIVE Final    Comment: (NOTE) SARS-CoV-2 target nucleic acids are NOT DETECTED. The SARS-CoV-2 RNA is generally detectable in upper and lower respiratory specimens during the acute phase of infection. Negative results do  not preclude SARS-CoV-2 infection, do not rule out co-infections with other pathogens, and should not be used as the sole basis for treatment or other patient management decisions. Negative results must be combined with clinical observations, patient history, and epidemiological information. The expected result is Negative. Fact Sheet for Patients: SugarRoll.be Fact Sheet for Healthcare Providers: https://www.woods-mathews.com/ This test is not yet approved or cleared by the [place]  FDA and  has been authorized for detection and/or diagnosis of SARS-CoV-2 by FDA under an Emergency Use Authorization (EUA). This EUA will remain  in effect (meaning this test can be used) for the duration of the COVID-19 declaration under Section 56 4(b)(1) of the Act, 21 U.S.C. section 360bbb-3(b)(1), unless the authorization is terminated or revoked sooner. Performed at [facility], [place] [address], [place], [place] [address]   Culture, blood (routine x 2) Call MD if unable to obtain prior to antibiotics being given     Status: None (Preliminary result)   Collection Time: 07/15/19 12:36 AM   Specimen: BLOOD  Result Value Ref Range Status   Specimen Description BLOOD RIGHT HAND  Final   Special Requests   Final    BOTTLES DRAWN AEROBIC AND ANAEROBIC Blood Culture adequate volume   Culture   Final    NO GROWTH 2 DAYS Performed at [facility], [address], [place], [place] [address]    Report Status PENDING  Incomplete  Culture, blood (routine x 2) Call MD if unable to obtain prior to antibiotics being given     Status: None (Preliminary result)   Collection Time: 07/15/19 12:36 AM   Specimen: BLOOD  Result Value Ref Range Status   Specimen Description BLOOD LEFT [facility]  Final   Special Requests   Final    BOTTLES DRAWN AEROBIC AND ANAEROBIC Blood Culture adequate volume   Culture   Final    NO GROWTH 2 DAYS Performed at [facility], [address], [place], [place] [address]    Report Status PENDING  Incomplete    Procedures and diagnostic studies:  EEG  Result Date: 07/15/2019 Alexis Goodell, MD     07/15/2019  6:22 PM ELECTROENCEPHALOGRAM REPORT Patient: ZAYLYNN ASMAR       Room #: 128A-AA EEG No. ID: [id number] Age: 74 y.o.        Sex: female Requesting Physician: Mal Misty Report Date:  07/15/2019       Interpreting Physician: Alexis Goodell History: TANAY SKAJA is an 74 y.o. female with altered mental status Medications: Rocephin, Cardizem, Solumedrol, Pravachol, Valtrex, Conditions of Recording:  This is a 21 channel routine scalp EEG performed with bipolar and monopolar montages arranged in accordance to the international 10/20 system of electrode placement. One channel was dedicated to EKG recording. The patient is in the awake state. Description:  The background activity is slow and poorly organiized.  It consists low voltage, irregular activity along the delta-theta continuum.  This activity is diffusely organized and continuous.  No epileptiform activity is noted. Hyperventilation was not performed.  Intermittent photic stimulation was performed but failed to illicit any change in the tracing. IMPRESSION: This is an abnormal EEG secondary to general background slowing.  This finding may be seen with a diffuse disturbance that is etiologically nonspecific, but may include a metabolic encephalopathy, among other possibilities.  No epileptiform activity was noted.  Alexis Goodell, MD Neurology [phone] 07/15/2019, 6:13 PM   DG Chest Port 1 View  Result Date: 07/16/2019 CLINICAL DATA:  Evaluate pneumonia. Diaphoresis noted. EXAM: PORTABLE CHEST 1 VIEW COMPARISON:  07/14/2019 FINDINGS: Normal heart size. Aortic atherosclerosis. Scattered areas of scarring and or atelectasis are again noted and appears similar to previous exam. No pleural effusion or edema. IMPRESSION: Unchanged appearance of bilateral  areas of atelectasis and or scarring. Electronically Signed   By: Kerby Moors M.D.   On: 07/16/2019 09:14    Medications:   .  diltiazem  120 mg Oral TID  . enoxaparin (LOVENOX) injection  40 mg Subcutaneous Q12H  . hydrALAZINE  25 mg Oral TID  . hydrocerin   Topical BID  . pravastatin  10 mg Oral q1800  . predniSONE  40 mg Oral Q breakfast  . Ensure Max Protein  11 oz Oral Daily  . valACYclovir  1,000 mg Oral BID  . zolpidem  5 mg Oral QHS   Continuous Infusions: . sodium chloride 100 mL/hr at 07/17/19 1026     LOS: 2 days   Debbera Wolken  [facility]     07/17/2019, 12:15 PM

## 2019-07-17 NOTE — Plan of Care (Signed)
  Problem: Health Behavior/Discharge Planning: Goal: Ability to manage health-related needs will improve Outcome: Progressing   Problem: Clinical Measurements: Goal: Ability to maintain clinical measurements within normal limits will improve Outcome: Progressing Goal: Respiratory complications will improve Outcome: Progressing   Problem: Activity: Goal: Risk for activity intolerance will decrease Outcome: Progressing   Problem: Pain Managment: Goal: General experience of comfort will improve Outcome: Progressing   Problem: Safety: Goal: Ability to remain free from injury will improve Outcome: Progressing   

## 2019-07-17 NOTE — Progress Notes (Signed)
Subjective: More awake, follows commands   Objective: Current vital signs: BP (!) 150/64   Pulse 78   Temp 98 F (36.7 C) (Oral)   Resp 17   Ht '5\' 6"'$  (1.676 m)   Wt 116 kg   SpO2 95%   BMI 41.28 kg/m  Vital signs in last 24 hours: Temp:  [97.7 F (36.5 C)-98 F (36.7 C)] 98 F (36.7 C) (03/06 0753) Pulse Rate:  [56-78] 78 (03/06 0753) Resp:  [17-20] 17 (03/06 0753) BP: (130-150)/(57-64) 150/64 (03/06 0753) SpO2:  [94 %-95 %] 95 % (03/06 0753)  Intake/Output from previous day: 03/05 0701 - 03/06 0700 In: 2038.8 [I.V.:1938.8; IV Piggyback:100] Out: -  Intake/Output this shift: Total I/O In: 761.9 [P.O.:330; I.V.:431.9] Out: -  Nutritional status:  Diet Order            Diet Heart Room service appropriate? Yes; Fluid consistency: Thin  Diet effective now              Neurologic Exam: Mental Status: Alert, oriented.  Speech fluent without evidence of aphasia.  Able to follow 3 step commands without difficulty. Cranial Nerves: II: Visual fields grossly normal, pupils equal, round, reactive to light and accommodation III,IV, VI: ptosis not present, extra-ocular motions intact bilaterally V,VII: smile symmetric, facial light touch sensation normal bilaterally VIII: HOH bilaterally IX,X: gag reflex present XI: bilateral shoulder shrug XII: midline tongue extension Motor: Lifts both upper extremities strongly off the bed.  Has some generalized lower extremity weakness but difficult to test due to seated position.   Sensory: Pinprick and light touch intact throughout, bilaterally   Lab Results: Basic Metabolic Panel: Recent Labs  Lab 07/14/19 2034 07/14/19 2034 07/15/19 1430 07/16/19 0829 07/17/19 0707  NA 136  --  133* 135 137  K 4.3  --  4.1 4.1 4.5  CL 99  --  100 104 105  CO2 25  --  '24 23 23  '$ GLUCOSE 125*  --  151* 164* 127*  BUN 41*  --  49* 52* 51*  CREATININE 1.26*  --  1.29* 1.12* 0.93  CALCIUM 9.3   < > 9.0 8.6* 8.5*   < > = values in this  interval not displayed.    Liver Function Tests: Recent Labs  Lab 07/14/19 2034  AST 64*  ALT 31  ALKPHOS 75  BILITOT 0.8  PROT 8.3*  ALBUMIN 4.4   No results for input(s): LIPASE, AMYLASE in the last 168 hours. No results for input(s): AMMONIA in the last 168 hours.  CBC: Recent Labs  Lab 07/14/19 2034 07/15/19 0036  WBC 5.2 5.4  HGB 15.4* 14.5  HCT 45.9 43.0  MCV 92.5 91.7  PLT 131* 114*    Cardiac Enzymes: No results for input(s): CKTOTAL, CKMB, CKMBINDEX, TROPONINI in the last 168 hours.  Lipid Panel: No results for input(s): CHOL, TRIG, HDL, CHOLHDL, VLDL, LDLCALC in the last 168 hours.  CBG: No results for input(s): GLUCAP in the last 168 hours.  Microbiology: Results for orders placed or performed during the hospital encounter of 07/14/19  SARS CORONAVIRUS 2 (TAT 6-24 HRS) Nasopharyngeal Nasopharyngeal Swab     Status: None   Collection Time: 07/14/19 11:19 PM   Specimen: Nasopharyngeal Swab  Result Value Ref Range Status   SARS Coronavirus 2 NEGATIVE NEGATIVE Final    Comment: (NOTE) SARS-CoV-2 target nucleic acids are NOT DETECTED. The SARS-CoV-2 RNA is generally detectable in upper and lower respiratory specimens during the acute phase of infection. Negative  results do not preclude SARS-CoV-2 infection, do not rule out co-infections with other pathogens, and should not be used as the sole basis for treatment or other patient management decisions. Negative results must be combined with clinical observations, patient history, and epidemiological information. The expected result is Negative. Fact Sheet for Patients: SugarRoll.be Fact Sheet for Healthcare Providers: https://www.woods-mathews.com/ This test is not yet approved or cleared by the [place] FDA and  has been authorized for detection and/or diagnosis of SARS-CoV-2 by FDA under an Emergency Use Authorization (EUA). This EUA will remain  in  effect (meaning this test can be used) for the duration of the COVID-19 declaration under Section 56 4(b)(1) of the Act, 21 U.S.C. section 360bbb-3(b)(1), unless the authorization is terminated or revoked sooner. Performed at [facility], [place] [address], [place], [place] [address]   Culture, blood (routine x 2) Call MD if unable to obtain prior to antibiotics being given     Status: None (Preliminary result)   Collection Time: 07/15/19 12:36 AM   Specimen: BLOOD  Result Value Ref Range Status   Specimen Description BLOOD RIGHT HAND  Final   Special Requests   Final    BOTTLES DRAWN AEROBIC AND ANAEROBIC Blood Culture adequate volume   Culture   Final    NO GROWTH 2 DAYS Performed at [facility], [address] [place], [place] [address]    Report Status PENDING  Incomplete  Culture, blood (routine x 2) Call MD if unable to obtain prior to antibiotics being given     Status: None (Preliminary result)   Collection Time: 07/15/19 12:36 AM   Specimen: BLOOD  Result Value Ref Range Status   Specimen Description BLOOD LEFT [facility]  Final   Special Requests   Final    BOTTLES DRAWN AEROBIC AND ANAEROBIC Blood Culture adequate volume   Culture   Final    NO GROWTH 2 DAYS Performed at [facility], [address] [place], [place] [address]    Report Status PENDING  Incomplete    Coagulation Studies: No results for input(s): LABPROT, INR in the last 72 hours.  Imaging: EEG  Result Date: 07/15/2019 Alexis Goodell, MD     07/15/2019  6:22 PM ELECTROENCEPHALOGRAM REPORT Patient: Rachel [name]       Room #: 128A-AA EEG No. ID: [id number] Age: 74 y.o.        Sex: female Requesting Physician: Mal Misty Report Date:  07/15/2019       Interpreting Physician: Alexis Goodell History: Rachel [name] is an 74 y.o. female with altered mental status Medications: Rocephin, Cardizem, Solumedrol, Pravachol, Valtrex, Conditions of Recording:  This is a 21 channel routine scalp EEG  performed with bipolar and monopolar montages arranged in accordance to the international 10/20 system of electrode placement. One channel was dedicated to EKG recording. The patient is in the awake state. Description:  The background activity is slow and poorly organiized.  It consists low voltage, irregular activity along the delta-theta continuum.  This activity is diffusely organized and continuous.  No epileptiform activity is noted. Hyperventilation was not performed.  Intermittent photic stimulation was performed but failed to illicit any change in the tracing. IMPRESSION: This is an abnormal EEG secondary to general background slowing.  This finding may be seen with a diffuse disturbance that is etiologically nonspecific, but may include a metabolic encephalopathy, among other possibilities.  No epileptiform activity was noted.  Alexis Goodell, MD Neurology [phone] 07/15/2019, 6:13 PM  DG Chest Port 1 View  Result Date: 07/16/2019 CLINICAL DATA:  Evaluate pneumonia. Diaphoresis noted. EXAM: PORTABLE CHEST 1 VIEW COMPARISON:  07/14/2019 FINDINGS: Normal heart size. Aortic atherosclerosis. Scattered areas of scarring and or atelectasis are again noted and appears similar to previous exam. No pleural effusion or edema. IMPRESSION: Unchanged appearance of bilateral areas of atelectasis and or scarring. Electronically Signed   By: Kerby Moors M.D.   On: 07/16/2019 09:14    Medications:  I have reviewed the patient's current medications. Scheduled: . diltiazem  120 mg Oral TID  . enoxaparin (LOVENOX) injection  40 mg Subcutaneous Q12H  . hydrALAZINE  25 mg Oral TID  . hydrocerin   Topical BID  . pravastatin  10 mg Oral q1800  . predniSONE  40 mg Oral Q breakfast  . Ensure Max Protein  11 oz Oral Daily  . valACYclovir  1,000 mg Oral BID  . zolpidem  5 mg Oral QHS    Assessment/Plan: 74 y.o. female with a medical history significant forCOPD on home oxygen at 2 L depression, and hearing  impairment with a cochlear implant who was brought to the emergency room because of confusion. Unclear duration but less than 3 days.  Appears to be close to baseline at this time.  AKI remains.  Confusion likely secondary to a metabolic encephalopathy.  ESR, B12 and TSH, UDS are normal.  EEG only significant for slowing.   - CtH repeat no abnormalities - will sign off for now

## 2019-07-18 MED ORDER — IPRATROPIUM-ALBUTEROL 0.5-2.5 (3) MG/3ML IN SOLN
3.0000 mL | Freq: Four times a day (QID) | RESPIRATORY_TRACT | Status: DC | PRN
  Administered 2019-07-18: 3 mL via RESPIRATORY_TRACT
  Filled 2019-07-18: qty 3

## 2019-07-18 NOTE — Progress Notes (Signed)
Progress Note    Rachel [name]  [id number] DOB: 01-05-1946  DOA: 07/14/2019 PCP: Albina Billet, MD      Brief Narrative:    Medical records reviewed and are as summarized below:  Rachel [name] is an 74 y.o. female with medical history significant for COPD on home oxygen at 2 L depression, and hearing impairment with a cochlear implant who was brought to the emergency room because of acute confusion.  At baseline, patient is homebound and she and her neighbors check in on her every day and bring food for her.  Her sister last saw her about 3 days ago prior to admission and at that time, her mental status was normal. Apparently on arrival of EMS, she had urinated on herself on the couch and appeared to be staring blankly and they had to lift her into the EMS vehicle.    Reportedly, her condition has been declining for several months and she has been having increasing shortness of breath with exertion even walking from room to room.     There have been some concerns about her continuing to live on her own.    Was also found to have a rash on her chest. On arrival of EMS she was apparently satting at 90% on 2 L and she was increased to 4 L      Assessment/Plan:   Principal Problem:   Acute confusion Active Problems:   Essential hypertension   Morbid obesity (HCC)   Acute on chronic respiratory failure with hypoxemia (HCC)   COPD with acute exacerbation ([place])   Shingles   AKI (acute kidney injury) ([place])   Acute confusional state/acute toxic metabolic encephalopathy: Improved.  Repeat CT head did not show any acute abnormality.  Mild AKI: resolved   Herpes zoster infection: Continue Valtrex.  Analgesics as needed for pain.  Acute on chronic hypoxemic respiratory failure: She is down to 2 L/min oxygen.  Continue oxygen via nasal cannula.  Repeat chest x-ray did not show any acute abnormality.    COPD with acute exacerbation: Continue steroids and bronchodilators.     Hypertension: Continue antihypertensives.  Discontinue IV fluids   Body mass index is 41.28 kg/m.  (Morbid obesity)   Family Communication/Anticipated D/C date and plan/Code Status   DVT prophylaxis: Lovenox Code Status: Full code Family Communication: Plan discussed with patient Disposition Plan: Patient is from home.  She may be discharged to SNF.   Possible discharge in 1 to 2 days.  Follow-up with social worker to assist with disposition.  Awaiting placement to SNF     Subjective:   Pain from herpes zoster rash is a little better today.  Objective:    Vitals:   07/17/19 1521 07/17/19 2004 07/18/19 0045 07/18/19 0839  BP: (!) 142/60 (!) 161/65 (!) 155/70 (!) 169/76  Pulse: 82 65 (!) 58 66  Resp: 16 16 18 18   Temp: 98.3 F (36.8 C) 97.9 F (36.6 C) 98.1 F (36.7 C) 97.7 F (36.5 C)  TempSrc: Oral Oral    SpO2: 95% 92% 93% 96%  Weight:      Height:        Intake/Output Summary (Last 24 hours) at 07/18/2019 1039 Last data filed at 07/18/2019 0944 Gross per 24 hour  Intake 2614.74 ml  Output --  Net 2614.74 ml   Filed Weights   07/14/19 2030  Weight: 116 kg    Exam:  GEN: No acute distress SKIN: Erythematous vesicular rash on  right upper chest, right breast, right axilla and right upper back EYES: PERRLA ENT: MMM, bilateral hearing impairment CV: Regular rate and rhythm PULM: Clear to auscultation bilaterally ABD: soft, obese, NT, +BS CNS: AAO x 3, non focal EXT: No edema or tenderness   Data Reviewed:   I have personally reviewed following labs and imaging studies:  Labs: Labs show the following:   Basic Metabolic Panel: Recent Labs  Lab 07/14/19 2034 07/14/19 2034 07/15/19 1430 07/15/19 1430 07/16/19 0829 07/17/19 0707  NA 136  --  133*  --  135 137  K 4.3   < > 4.1   < > 4.1 4.5  CL 99  --  100  --  104 105  CO2 25  --  24  --  23 23  GLUCOSE 125*  --  151*  --  164* 127*  BUN 41*  --  49*  --  52* 51*  CREATININE 1.26*  --   1.29*  --  1.12* 0.93  CALCIUM 9.3  --  9.0  --  8.6* 8.5*   < > = values in this interval not displayed.   GFR Estimated Creatinine Clearance: 68.7 mL/min (by C-G formula based on SCr of 0.93 mg/dL). Liver Function Tests: Recent Labs  Lab 07/14/19 2034  AST 64*  ALT 31  ALKPHOS 75  BILITOT 0.8  PROT 8.3*  ALBUMIN 4.4   No results for input(s): LIPASE, AMYLASE in the last 168 hours. No results for input(s): AMMONIA in the last 168 hours. Coagulation profile No results for input(s): INR, PROTIME in the last 168 hours.  CBC: Recent Labs  Lab 07/14/19 2034 07/15/19 0036  WBC 5.2 5.4  HGB 15.4* 14.5  HCT 45.9 43.0  MCV 92.5 91.7  PLT 131* 114*   Cardiac Enzymes: No results for input(s): CKTOTAL, CKMB, CKMBINDEX, TROPONINI in the last 168 hours. BNP (last 3 results) No results for input(s): PROBNP in the last 8760 hours. CBG: No results for input(s): GLUCAP in the last 168 hours. D-Dimer: No results for input(s): DDIMER in the last 72 hours. Hgb A1c: No results for input(s): HGBA1C in the last 72 hours. Lipid Profile: No results for input(s): CHOL, HDL, LDLCALC, TRIG, CHOLHDL, LDLDIRECT in the last 72 hours. Thyroid function studies: Recent Labs    07/15/19 1430  TSH 1.421   Anemia work up: Recent Labs    07/15/19 1430  VITAMINB12 269   Sepsis Labs: Recent Labs  Lab 07/14/19 2034 07/15/19 0036  WBC 5.2 5.4  LATICACIDVEN 1.7  --     Microbiology Recent Results (from the past 240 hour(s))  SARS CORONAVIRUS 2 (TAT 6-24 HRS) Nasopharyngeal Nasopharyngeal Swab     Status: None   Collection Time: 07/14/19 11:19 PM   Specimen: Nasopharyngeal Swab  Result Value Ref Range Status   SARS Coronavirus 2 NEGATIVE NEGATIVE Final    Comment: (NOTE) SARS-CoV-2 target nucleic acids are NOT DETECTED. The SARS-CoV-2 RNA is generally detectable in upper and lower respiratory specimens during the acute phase of infection. Negative results do not preclude SARS-CoV-2  infection, do not rule out co-infections with other pathogens, and should not be used as the sole basis for treatment or other patient management decisions. Negative results must be combined with clinical observations, patient history, and epidemiological information. The expected result is Negative. Fact Sheet for Patients: SugarRoll.be Fact Sheet for Healthcare Providers: https://www.woods-mathews.com/ This test is not yet approved or cleared by the [place] and  has been authorized  for detection and/or diagnosis of SARS-CoV-2 by FDA under an Emergency Use Authorization (EUA). This EUA will remain  in effect (meaning this test can be used) for the duration of the COVID-19 declaration under Section 56 4(b)(1) of the Act, 21 U.S.C. section 360bbb-3(b)(1), unless the authorization is terminated or revoked sooner. Performed at [facility], [place] [address] [place], [place] [address]   Culture, blood (routine x 2) Call MD if unable to obtain prior to antibiotics being given     Status: None (Preliminary result)   Collection Time: 07/15/19 12:36 AM   Specimen: BLOOD  Result Value Ref Range Status   Specimen Description BLOOD RIGHT HAND  Final   Special Requests   Final    BOTTLES DRAWN AEROBIC AND ANAEROBIC Blood Culture adequate volume   Culture   Final    NO GROWTH 3 DAYS Performed at [facility], [address], [place], [place] [address]    Report Status PENDING  Incomplete  Culture, blood (routine x 2) Call MD if unable to obtain prior to antibiotics being given     Status: None (Preliminary result)   Collection Time: 07/15/19 12:36 AM   Specimen: BLOOD  Result Value Ref Range Status   Specimen Description BLOOD LEFT [facility]  Final   Special Requests   Final    BOTTLES DRAWN AEROBIC AND ANAEROBIC Blood Culture adequate volume   Culture   Final    NO GROWTH 3 DAYS Performed at [facility], [address] [place], [place] [address]    Report Status PENDING  Incomplete    Procedures and diagnostic studies:  CT HEAD WO CONTRAST  Result Date: 07/17/2019 CLINICAL DATA:  Encephalopathy. Additional history provided: Medical history significant for COPD on home oxygen, depression, hearing impairment with cochlear implant with acute confusion EXAM: CT HEAD WITHOUT CONTRAST TECHNIQUE: Contiguous axial images were obtained from the base of the skull through the vertex without intravenous contrast. COMPARISON:  Head CT 07/15/2019 FINDINGS: Brain: The examination is significantly limited by streak artifact arising from a right-sided cochlear implant. There is no evidence of acute intracranial hemorrhage, intracranial mass, midline shift or extra-axial fluid collection.No demarcated cortical infarction is identified. Similar appearance of ill-defined hypoattenuation within the cerebral white matter which is nonspecific, but consistent with chronic small vessel ischemic disease. Stable moderate generalized parenchymal atrophy. Vascular: No definite hyperdense vessel. Atherosclerotic calcifications. Skull: Normal. Negative for fracture or focal lesion. Sinuses/Orbits: Visualized orbits demonstrate no acute abnormality. No significant paranasal sinus disease at the imaged levels. Previous right mastoidectomy. Fluid is present within remaining right mastoid air cells. IMPRESSION: Examination significantly limited by streak artifact arising from a right-sided cochlear implant. Within this limitation, no evidence of acute intracranial abnormality. Stable generalized parenchymal atrophy and chronic small vessel ischemic disease. Sequela of previous right mastoidectomy and cochlear implantation. Partial opacification of remaining right mastoid air cells. Electronically Signed   By: Kellie Simmering DO   On: 07/17/2019 14:26    Medications:   . diltiazem  120 mg Oral TID  . enoxaparin (LOVENOX) injection  40 mg Subcutaneous  Q12H  . hydrALAZINE  25 mg Oral TID  . hydrocerin   Topical BID  . pravastatin  10 mg Oral q1800  . predniSONE  40 mg Oral Q breakfast  . Ensure Max Protein  11 oz Oral Daily  . valACYclovir  1,000 mg Oral BID  . zolpidem  5 mg Oral QHS   Continuous Infusions:    LOS:  3 days   Bennie Chirico  [facility]     07/18/2019, 10:39 AM

## 2019-07-18 NOTE — TOC Progression Note (Signed)
Transition of Care [facility]) - Progression Note    Patient Details  Name: Rachel [name] MRN: [id number] Date of Birth: 1946-01-29  [facility] CM/SW Contact  Boris Sharper, LCSW Phone Number:[phone] 07/18/2019, 2:01 PM  Clinical Narrative:    CSW spoke with pt about SNF options and she said to call her sister about it. CSW called pt's sister Olivia Mackie and notified her of the bed offers. Olivia Mackie wanted to speak with pt's son about options and call back I provided her with Jacqlyn Larsen Dupree's number to call if they do not make a decision by today.  TOC will continue to follow for discharge planning needs.   Expected Discharge Plan: [place] Barriers to Discharge: Continued Medical Work up  Expected Discharge Plan and Services Expected Discharge Plan: [place] In-house Referral: Clinical Social Work Discharge Planning Services: NA Post Acute Care Choice: [place] Living arrangements for the past 2 months: Single Family Home                                       Social Determinants of Health (SDOH) Interventions    Readmission Risk Interventions No flowsheet data found.

## 2019-07-19 LAB — RESPIRATORY PANEL BY RT PCR (FLU A&B, COVID)
Influenza A by PCR: NEGATIVE
Influenza B by PCR: NEGATIVE
SARS Coronavirus 2 by RT PCR: NEGATIVE

## 2019-07-19 MED ORDER — LISINOPRIL 10 MG PO TABS
10.0000 mg | ORAL_TABLET | Freq: Every day | ORAL | Status: DC
  Administered 2019-07-19: 10 mg via ORAL
  Filled 2019-07-19: qty 1

## 2019-07-19 NOTE — Progress Notes (Signed)
Physical Therapy Treatment Patient Details Name: Rachel [name] MRN: [id number] DOB: 03-04-1946 Today's Date: 07/19/2019    History of Present Illness 74 y.o. female with medical history significant for COPD on home oxygen at 2 L depression, and hearing impairment with a cochlear implant who was brought to the emergency room because of acute confusion.  Per daughter, pt has been declining for several months with increased amount of SOB during activity. No acute neurological findings.  Patient found to have shingles.    PT Comments    Pt in bed.  Has headset amplifier on today which helps with communication due to [facility].  Stated she needs to use bathroom.  Out of bed with min guard.  Transfers to commode to void and is able to attend to own self care.  She needs min assist to get LE's into bed.  Pt on 2 lpm baseline.  sats decreased to 88% with mobility with general SOB and increased resp rate.  Stated she gets SOB at home.  She did not feel like she could walk a this time even after resting "I think that's enough for now"  Requested to remain in bed.  Discussed with RN regarding SOB.   Follow Up Recommendations  SNF     Equipment Recommendations  Rolling walker with 5" wheels    Recommendations for Other Services       Precautions / Restrictions Precautions Precautions: Fall(shingles isolation) Precaution Comments: Contact and airborne precautions - shingles Restrictions Weight Bearing Restrictions: No    Mobility  Bed Mobility Overal bed mobility: Needs Assistance Bed Mobility: Supine to Sit;Sit to Supine Rolling: Min guard   Supine to sit: Min assist     General bed mobility comments: needs assist to get LE's back onto bed  Transfers Overall transfer level: Needs assistance Equipment used: None Transfers: Sit to/from Stand Sit to Stand: Min guard         General transfer comment: transfers to commode with min guard.  Ambulation/Gait             General  Gait Details: deferred due to SOB   Stairs             Wheelchair Mobility    Modified Rankin (Stroke Patients Only)       Balance Overall balance assessment: Needs assistance Sitting-balance support: Feet supported;No upper extremity supported Sitting balance-Leahy Scale: Good     Standing balance support: No upper extremity supported Standing balance-Leahy Scale: Fair                              Cognition Arousal/Alertness: Awake/alert Behavior During Therapy: WFL for tasks assessed/performed Overall Cognitive Status: Within Functional Limits for tasks assessed                                        Exercises      General Comments        Pertinent Vitals/Pain Pain Assessment: No/denies pain    Home Living                      Prior Function            PT Goals (current goals can now be found in the care plan section) Progress towards PT goals: Progressing toward goals    Frequency    Min  2X/week      PT Plan Current plan remains appropriate    Co-evaluation              AM-PAC PT "6 Clicks" Mobility   Outcome Measure  Help needed turning from your back to your side while in a flat bed without using bedrails?: A Little Help needed moving from lying on your back to sitting on the side of a flat bed without using bedrails?: A Little Help needed moving to and from a bed to a chair (including a wheelchair)?: A Little Help needed standing up from a chair using your arms (e.g., wheelchair or bedside chair)?: A Little Help needed to walk in hospital room?: A Lot Help needed climbing 3-5 steps with a railing? : A Lot 6 Click Score: 16    End of Session Equipment Utilized During Treatment: Gait belt;Oxygen Activity Tolerance: Patient limited by fatigue Patient left: in bed;with call bell/phone within reach;with bed alarm set Nurse Communication: Other (comment)(Sats with mobility)       Time:  [id number] PT Time Calculation (min) (ACUTE ONLY): 21 min  Charges:  $Therapeutic Activity: 8-22 mins                    Chesley Noon, PTA 07/19/19, 9:25 AM

## 2019-07-19 NOTE — Progress Notes (Addendum)
Progress Note    Rachel [name]  [id number] DOB: 1945/10/11  DOA: 07/14/2019 PCP: Albina Billet, MD      Brief Narrative:    Medical records reviewed and are as summarized below:  Rachel [name] is an 74 y.o. female with medical history significant for COPD on home oxygen at 2 L depression, and hearing impairment with a cochlear implant who was brought to the emergency room because of acute confusion.  At baseline, patient is homebound and she and her neighbors check in on her every day and bring food for her.  Her sister last saw her about 3 days ago prior to admission and at that time, her mental status was normal. Apparently on arrival of EMS, she had urinated on herself on the couch and appeared to be staring blankly and they had to lift her into the EMS vehicle.    Reportedly, her condition has been declining for several months and she has been having increasing shortness of breath with exertion even walking from room to room.     There have been some concerns about her continuing to live on her own.  She was also found to have a rash on her chest. On arrival of EMS she was apparently satting at 90% on 2 L and she was increased to 4 L.  She was treated with prednisone and bronchodilators for COPD exacerbation.  She was initially treated with empiric IV antibiotics for suspected pneumonia.  However, there was no evidence of infection so antibiotics were discontinued.  There was also concern for acute stroke.  However, repeat CT scan of the head did not show any evidence of acute stroke.  She has an erythematous rash with papules on fasciculus on the right upper chest, right axilla and right upper back that is consistent with shingles.  She was started on Valtrex for treatment of shingles.  She also had mild acute kidney injury and was treated with IV fluids.  Her confusion on admission was attributed to acute toxic metabolic encephalopathy.  She was evaluated by PT and OT who  recommended further rehabilitation at a skilled nursing facility.      Assessment/Plan:   Principal Problem:   Acute confusion Active Problems:   Essential hypertension   Morbid obesity (HCC)   Acute on chronic respiratory failure with hypoxemia (HCC)   COPD with acute exacerbation (HCC)   Shingles   AKI (acute kidney injury) ([place])   Acute confusional state/acute toxic metabolic encephalopathy: Improved.  Repeat CT head did not show any acute abnormality.  Mild AKI: resolved   Herpes zoster infection: Continue Valtrex.  Analgesics as needed for pain.  Acute on chronic hypoxemic respiratory failure: She is down to 2 L/min oxygen.  Continue oxygen via nasal cannula.  Repeat chest x-ray did not show any acute abnormality.    COPD with acute exacerbation: Continue bronchodilators.  Discontinue prednisone  Hypertension: BP is well controlled.  Continue antihypertensives.  Lisinopril has been ordered for adequate BP control.  Body mass index is 41.28 kg/m.  (Morbid obesity)   Family Communication/Anticipated D/C date and plan/Code Status   DVT prophylaxis: Lovenox Code Status: Full code Family Communication: Plan discussed with patient Disposition Plan: Patient is from home.  Plan to discharge to SNF tomorrow.  She is medically stable for discharge.  Awaiting insurance authorization.    Subjective:   No new complaints.  She still has some pain in the area with rash  Objective:  Vitals:   07/19/19 0113 07/19/19 0753 07/19/19 0919 07/19/19 0951  BP: (!) 159/66 (!) 182/83    Pulse: 62 64    Resp: 18 18    Temp: 98.1 F (36.7 C) 97.8 F (36.6 C)    TempSrc:      SpO2: 93% 94% (!) 88% 96%  Weight:      Height:       No intake or output data in the 24 hours ending 07/19/19 1532 Filed Weights   07/14/19 2030  Weight: 116 kg    Exam:  GEN: No acute distress SKIN: Erythematous vesicular rash on right upper chest, right breast, right axilla and right upper  back.  Small ruptured blisters/erosions noted in these areas as well. EYES: PERRLA ENT: MMM, bilateral hearing impairment CV: Regular rate and rhythm PULM: Air entry adequate bilaterally, no wheezing or rales heard ABD: soft, obese, NT, +BS CNS: AAO x 3, non focal EXT: No edema or tenderness   Data Reviewed:   I have personally reviewed following labs and imaging studies:  Labs: Labs show the following:   Basic Metabolic Panel: Recent Labs  Lab 07/14/19 2034 07/14/19 2034 07/15/19 1430 07/15/19 1430 07/16/19 0829 07/17/19 0707  NA 136  --  133*  --  135 137  K 4.3   < > 4.1   < > 4.1 4.5  CL 99  --  100  --  104 105  CO2 25  --  24  --  23 23  GLUCOSE 125*  --  151*  --  164* 127*  BUN 41*  --  49*  --  52* 51*  CREATININE 1.26*  --  1.29*  --  1.12* 0.93  CALCIUM 9.3  --  9.0  --  8.6* 8.5*   < > = values in this interval not displayed.   GFR Estimated Creatinine Clearance: 68.7 mL/min (by C-G formula based on SCr of 0.93 mg/dL). Liver Function Tests: Recent Labs  Lab 07/14/19 2034  AST 64*  ALT 31  ALKPHOS 75  BILITOT 0.8  PROT 8.3*  ALBUMIN 4.4   No results for input(s): LIPASE, AMYLASE in the last 168 hours. No results for input(s): AMMONIA in the last 168 hours. Coagulation profile No results for input(s): INR, PROTIME in the last 168 hours.  CBC: Recent Labs  Lab 07/14/19 2034 07/15/19 0036  WBC 5.2 5.4  HGB 15.4* 14.5  HCT 45.9 43.0  MCV 92.5 91.7  PLT 131* 114*   Cardiac Enzymes: No results for input(s): CKTOTAL, CKMB, CKMBINDEX, TROPONINI in the last 168 hours. BNP (last 3 results) No results for input(s): PROBNP in the last 8760 hours. CBG: No results for input(s): GLUCAP in the last 168 hours. D-Dimer: No results for input(s): DDIMER in the last 72 hours. Hgb A1c: No results for input(s): HGBA1C in the last 72 hours. Lipid Profile: No results for input(s): CHOL, HDL, LDLCALC, TRIG, CHOLHDL, LDLDIRECT in the last 72 hours. Thyroid  function studies: No results for input(s): TSH, T4TOTAL, T3FREE, THYROIDAB in the last 72 hours.  Invalid input(s): FREET3 Anemia work up: No results for input(s): VITAMINB12, FOLATE, FERRITIN, TIBC, IRON, RETICCTPCT in the last 72 hours. Sepsis Labs: Recent Labs  Lab 07/14/19 2034 07/15/19 0036  WBC 5.2 5.4  LATICACIDVEN 1.7  --     Microbiology Recent Results (from the past 240 hour(s))  SARS CORONAVIRUS 2 (TAT 6-24 HRS) Nasopharyngeal Nasopharyngeal Swab     Status: None   Collection Time: 07/14/19  11:19 PM   Specimen: Nasopharyngeal Swab  Result Value Ref Range Status   SARS Coronavirus 2 NEGATIVE NEGATIVE Final    Comment: (NOTE) SARS-CoV-2 target nucleic acids are NOT DETECTED. The SARS-CoV-2 RNA is generally detectable in upper and lower respiratory specimens during the acute phase of infection. Negative results do not preclude SARS-CoV-2 infection, do not rule out co-infections with other pathogens, and should not be used as the sole basis for treatment or other patient management decisions. Negative results must be combined with clinical observations, patient history, and epidemiological information. The expected result is Negative. Fact Sheet for Patients: SugarRoll.be Fact Sheet for Healthcare Providers: https://www.woods-mathews.com/ This test is not yet approved or cleared by the [place] FDA and  has been authorized for detection and/or diagnosis of SARS-CoV-2 by FDA under an Emergency Use Authorization (EUA). This EUA will remain  in effect (meaning this test can be used) for the duration of the COVID-19 declaration under Section 56 4(b)(1) of the Act, 21 U.S.C. section 360bbb-3(b)(1), unless the authorization is terminated or revoked sooner. Performed at [facility], [place] [address] [place], [place] [address]   Culture, blood (routine x 2) Call MD if unable to obtain prior to antibiotics being given      Status: None (Preliminary result)   Collection Time: 07/15/19 12:36 AM   Specimen: BLOOD  Result Value Ref Range Status   Specimen Description BLOOD RIGHT HAND  Final   Special Requests   Final    BOTTLES DRAWN AEROBIC AND ANAEROBIC Blood Culture adequate volume   Culture   Final    NO GROWTH 4 DAYS Performed at [facility], [address], [place], [place] [address]    Report Status PENDING  Incomplete  Culture, blood (routine x 2) Call MD if unable to obtain prior to antibiotics being given     Status: None (Preliminary result)   Collection Time: 07/15/19 12:36 AM   Specimen: BLOOD  Result Value Ref Range Status   Specimen Description BLOOD LEFT [facility]  Final   Special Requests   Final    BOTTLES DRAWN AEROBIC AND ANAEROBIC Blood Culture adequate volume   Culture   Final    NO GROWTH 4 DAYS Performed at [facility], [address] [place], [place] [address]    Report Status PENDING  Incomplete  Respiratory Panel by RT PCR (Flu A&B, Covid) - Nasopharyngeal Swab     Status: None   Collection Time: 07/19/19 10:28 AM   Specimen: Nasopharyngeal Swab  Result Value Ref Range Status   SARS Coronavirus 2 by RT PCR NEGATIVE NEGATIVE Final    Comment: (NOTE) SARS-CoV-2 target nucleic acids are NOT DETECTED. The SARS-CoV-2 RNA is generally detectable in upper respiratoy specimens during the acute phase of infection. The lowest concentration of SARS-CoV-2 viral copies this assay can detect is 131 copies/mL. A negative result does not preclude SARS-Cov-2 infection and should not be used as the sole basis for treatment or other patient management decisions. A negative result may occur with  improper specimen collection/handling, submission of specimen other than nasopharyngeal swab, presence of viral mutation(s) within the areas targeted by this assay, and inadequate number of viral copies (<131 copies/mL). A negative result must be combined with clinical observations,  patient history, and epidemiological information. The expected result is Negative. Fact Sheet for Patients:  PinkCheek.be Fact Sheet for Healthcare Providers:  GravelBags.it This test is not yet ap proved or cleared by the [place] and  has been authorized for detection and/or diagnosis of SARS-CoV-2 by FDA under an Emergency Use Authorization (EUA). This EUA will remain  in effect (meaning this test can be used) for the duration of the COVID-19 declaration under Section 564(b)(1) of the Act, 21 U.S.C. section 360bbb-3(b)(1), unless the authorization is terminated or revoked sooner.    Influenza A by PCR NEGATIVE NEGATIVE Final   Influenza B by PCR NEGATIVE NEGATIVE Final    Comment: (NOTE) The Xpert Xpress SARS-CoV-2/FLU/RSV assay is intended as an aid in  the diagnosis of influenza from Nasopharyngeal swab specimens and  should not be used as a sole basis for treatment. Nasal washings and  aspirates are unacceptable for Xpert Xpress SARS-CoV-2/FLU/RSV  testing. Fact Sheet for Patients: PinkCheek.be Fact Sheet for Healthcare Providers: GravelBags.it This test is not yet approved or cleared by the [place] FDA and  has been authorized for detection and/or diagnosis of SARS-CoV-2 by  FDA under an Emergency Use Authorization (EUA). This EUA will remain  in effect (meaning this test can be used) for the duration of the  Covid-19 declaration under Section 564(b)(1) of the Act, 21  U.S.C. section 360bbb-3(b)(1), unless the authorization is  terminated or revoked. Performed at [facility], [place] [place], [place] [address]     Procedures and diagnostic studies:  No results found.  Medications:   . diltiazem  120 mg Oral TID  . enoxaparin (LOVENOX) injection  40 mg Subcutaneous Q12H  . hydrALAZINE  25 mg Oral TID  . hydrocerin    Topical BID  . lisinopril  10 mg Oral Daily  . pravastatin  10 mg Oral q1800  . Ensure Max Protein  11 oz Oral Daily  . valACYclovir  1,000 mg Oral BID  . zolpidem  5 mg Oral QHS   Continuous Infusions:    LOS: 4 days   Dianelly Ferran  [facility]     07/19/2019, 3:32 PM

## 2019-07-19 NOTE — TOC Progression Note (Signed)
Transition of Care [facility]) - Progression Note    Patient Details  Name: Rachel [name] MRN: [id number] Date of Birth: 04-24-1946  Transition of Care [facility]) CM/SW Contact  Gloriana Piltz, Gardiner Rhyme, LCSW Phone Number: 07/19/2019, 3:25 PM  Clinical Narrative:   Pt's COVID test back negative, still awaiting insurance auth from [facility]. Pt most likely will transfer tomorrow and MD and pt/family aware of this plan. [facility] is also aware.    Expected Discharge Plan: [place] Barriers to Discharge: Continued Medical Work up  Expected Discharge Plan and Services Expected Discharge Plan: [place] In-house Referral: Clinical Social Work Discharge Planning Services: NA Post Acute Care Choice: [place] Living arrangements for the past 2 months: Single Family Home                                       Social Determinants of Health (SDOH) Interventions    Readmission Risk Interventions No flowsheet data found.

## 2019-07-19 NOTE — Care Management Important Message (Signed)
Important Message  Patient Details  Name: Rachel [name] MRN: [id number] Date of Birth: 25-Nov-1945   Medicare Important Message Given:  Yes  Pt in isolation gave to bedside RN to give to pt    Elease Hashimoto, LCSW 07/19/2019, 11:59 AM

## 2019-07-19 NOTE — Plan of Care (Signed)
  Problem: Health Behavior/Discharge Planning: Goal: Ability to manage health-related needs will improve Outcome: Progressing   Problem: Clinical Measurements: Goal: Ability to maintain clinical measurements within normal limits will improve Outcome: Progressing Goal: Respiratory complications will improve Outcome: Progressing   Problem: Activity: Goal: Risk for activity intolerance will decrease Outcome: Progressing   Problem: Pain Managment: Goal: General experience of comfort will improve Outcome: Progressing   Problem: Safety: Goal: Ability to remain free from injury will improve Outcome: Progressing   

## 2019-07-19 NOTE — Progress Notes (Signed)
Occupational Therapy Treatment Patient Details Name: Rachel [name] MRN: [id number] DOB: 09-04-1945 Today's Date: 07/19/2019    History of present illness 74 y.o. female with medical history significant for COPD on home oxygen at 2 L depression, and hearing impairment with a cochlear implant who was brought to the emergency room because of acute confusion.  Per daughter, pt has been declining for several months with increased amount of SOB during activity. No acute neurological findings.  Patient found to have shingles.   OT comments  Patient agreeable to OT session this date.  Stated her shingles is giving her quite a bit of pain.  Nurse administered pain medication at end of session.  OT session targeting ADL retraining:  Completed toileting with MIN A for pericare and CGA for SPT.  Completed bed bath while sitting/standing at EOB with MIN A for UB and MAX A for LB/pericare.  Requires extra time due to shortness of breath and deficits in activity tolerance.  Demonstrated improved cognition and ability to communicate this session.  Therapist provided education on use of energy conservation techniques, compensatory techniques, body mechanics, sequencing, breathing techniques and self pacing to improve independence in self-care tasks.  Patient would benefit from continued OT services to address outlined performance components.  Based on today's performance, follow up recommendation remains appropriate.     Follow Up Recommendations  SNF    Equipment Recommendations  Other (comment)(defer to next level of care)    Recommendations for Other Services      Precautions / Restrictions Precautions Precautions: Fall Precaution Comments: Contact and airborne precautions - shingles Restrictions Weight Bearing Restrictions: No       Mobility Bed Mobility Overal bed mobility: Needs Assistance Bed Mobility: Supine to Sit;Sit to Supine     Supine to sit: Min guard;HOB elevated Sit to supine:  Min assist;HOB elevated   General bed mobility comments: needs assist to get LE's back onto bed  Transfers Overall transfer level: Needs assistance Equipment used: None Transfers: Sit to/from [facility] Sit to Stand: Min guard Stand pivot transfers: Min guard       General transfer comment: Cues needed for sequencing and hand placement    Balance           Standing balance support: Single extremity supported Standing balance-Leahy Scale: Fair Standing balance comment: Tolerated standing with 1 UE for support to perform pericare                           ADL either performed or assessed with clinical judgement   ADL Overall ADL's : Needs assistance/impaired     Grooming: Wash/dry hands;Wash/dry face;Oral care;Brushing hair;Sitting;Set up;Supervision/safety   Upper Body Bathing: Minimal assistance;Cueing for compensatory techniques;Sitting   Lower Body Bathing: Maximal assistance;Sit to/from stand;Cueing for sequencing;Cueing for compensatory techniques           Toilet Transfer: Min guard   Toileting- Clothing Manipulation and Hygiene: Minimal assistance;Cueing for sequencing;Sitting/lateral lean         General ADL Comments: requries extra time for all tasks due to poor activity tolerance     Vision Patient Visual Report: No change from baseline     Perception     Praxis      Cognition Arousal/Alertness: Awake/alert Behavior During Therapy: WFL for tasks assessed/performed Overall Cognitive Status: Within Functional Limits for tasks assessed  General Comments: Demonstrated improved understanding and is A&Ox4        Exercises Other Exercises Other Exercises: General safety education with use of call button, bed controls, etc Other Exercises: Education on body mechanics/sequencing/hand placement/pacing during functional transfers and bed mobility Other Exercises: Performed  toileting with CGA and MIN A for pericare.  Completed bed bath with MIN A for UB and MAX A for LB.  Education on use of energy conservation and compensatory techniques.   Shoulder Instructions       General Comments      Pertinent Vitals/ Pain       Pain Assessment: Faces Faces Pain Scale: Hurts a little bit Pain Location: Shingles rash.  Patient states she is in considerable pain.  Slight grimacing when performing UB bathing (over rash). Pain Descriptors / Indicators: Burning;Discomfort;Grimacing Pain Intervention(s): Limited activity within patient's tolerance;Monitored during session;RN gave pain meds during session  Home Living                                          Prior Functioning/Environment              Frequency  Min 1X/week        Progress Toward Goals  OT Goals(current goals can now be found in the care plan section)  Progress towards OT goals: Progressing toward goals     Plan Discharge plan remains appropriate;Frequency needs to be updated    Co-evaluation                 AM-PAC OT "6 Clicks" Daily Activity     Outcome Measure   Help from another person eating meals?: None Help from another person taking care of personal grooming?: None Help from another person toileting, which includes using toliet, bedpan, or urinal?: A Little Help from another person bathing (including washing, rinsing, drying)?: A Lot Help from another person to put on and taking off regular upper body clothing?: None Help from another person to put on and taking off regular lower body clothing?: A Lot 6 Click Score: 19    End of Session    OT Visit Diagnosis: Unsteadiness on feet (R26.81);Muscle weakness (generalized) (M62.81)   Activity Tolerance Patient tolerated treatment well   Patient Left in bed;with call bell/phone within reach;with bed alarm set;with nursing/sitter in room   Nurse Communication Other (comment)(Spoke to nurse that pt  was able to participate in bed bath)        Time: [id number] OT Time Calculation (min): 66 min  Charges: OT General Charges $OT Visit: 1 Visit OT Treatments $Self Care/Home Management : 53-67 mins  Baldomero Lamy, MS, OTR/L 07/19/19, 4:03 PM

## 2019-07-19 NOTE — TOC Progression Note (Addendum)
Transition of Care [facility]) - Progression Note    Patient Details  Name: Rachel [name] MRN: [id number] Date of Birth: [birth year]  Transition of Care [facility]) CM/SW Contact  Dex Blakely, Gardiner Rhyme, LCSW Phone Number: 07/19/2019, 11:12 AM  Clinical Narrative:  Son and sister along with pt have selected [facility] for rehab for pt. Have begun the insurance auth and spoke with [place] at [employer]. Dr Rockne Menghini has ordered rapid COVID and will see when can transfer to facility. Audry Pili will need to get back with worker, made aware pt does have shingles.  Rf number [id number]    Expected Discharge Plan: Skilled Nursing Facility Barriers to Discharge: Continued Medical Work up  Expected Discharge Plan and Services Expected Discharge Plan: [place] In-house Referral: Clinical Social Work Discharge Planning Services: NA Post Acute Care Choice: [place] Living arrangements for the past 2 months: Single Family Home                                       Social Determinants of Health (SDOH) Interventions    Readmission Risk Interventions No flowsheet data found.

## 2019-07-20 LAB — CULTURE, BLOOD (ROUTINE X 2)
Culture: NO GROWTH
Culture: NO GROWTH
Special Requests: ADEQUATE
Special Requests: ADEQUATE

## 2019-07-20 MED ORDER — VALACYCLOVIR HCL 1 G PO TABS: 1000.0000 mg | ORAL_TABLET | Freq: Two times a day (BID) | ORAL | 0 refills | Status: AC

## 2019-07-20 MED ORDER — ACETAMINOPHEN 325 MG PO TABS: 650.0000 mg | ORAL_TABLET | Freq: Four times a day (QID) | ORAL | Status: DC | PRN

## 2019-07-20 MED ORDER — LISINOPRIL 20 MG PO TABS
20.0000 mg | ORAL_TABLET | Freq: Every day | ORAL | Status: DC
  Administered 2019-07-20: 20 mg via ORAL
  Filled 2019-07-20: qty 1

## 2019-07-20 MED ORDER — ORAL CARE MOUTH RINSE
15.0000 mL | Freq: Two times a day (BID) | OROMUCOSAL | Status: DC
  Administered 2019-07-20: 15 mL via OROMUCOSAL

## 2019-07-20 MED ORDER — LISINOPRIL 20 MG PO TABS: 20.0000 mg | ORAL_TABLET | Freq: Every day | ORAL | Status: DC

## 2019-07-20 NOTE — TOC Progression Note (Signed)
Transition of Care [facility]) - Progression Note    Patient Details  Name: Rachel [name] MRN: [id number] Date of Birth: 1945/10/09  Transition of Care [facility]) CM/SW Contact  Kee Drudge, Gardiner Rhyme, LCSW Phone Number: 07/20/2019, 8:15 AM  Clinical Narrative:  Have received insurance auth from [facility] number [id number] starting today for three days until 3/11 with update due to [facility] fax [phone]. Will make sister, pt and son aware. Message left for [facility] not in yet. Work toward transfer today.     Expected Discharge Plan: [place] Barriers to Discharge: Continued Medical Work up  Expected Discharge Plan and Services Expected Discharge Plan: [place] In-house Referral: Clinical Social Work Discharge Planning Services: NA Post Acute Care Choice: [place] Living arrangements for the past 2 months: Single Family Home                                       Social Determinants of Health (SDOH) Interventions    Readmission Risk Interventions No flowsheet data found.

## 2019-07-20 NOTE — Discharge Summary (Addendum)
Physician Discharge Summary  Rachel [name] [id number] DOB: 1945-06-22 DOA: 07/14/2019  PCP: Albina Billet, MD  Admit date: 07/14/2019 Discharge date: 07/20/2019  Discharge disposition: SNF   Recommendations for Outpatient Follow-Up:   Outpatient follow up with physician at the nursing home   Discharge Diagnosis:   Principal Problem:   Acute confusion Active Problems:   Essential hypertension   Morbid obesity ([place])   Acute on chronic respiratory failure with hypoxemia ([place])   COPD with acute exacerbation ([place])   Shingles   AKI (acute kidney injury) ([place])    Discharge Condition: Stable.  Diet recommendation: Low salt diet  Code status: Full code.    Hospital Course:    Rachel [name] is an 74 y.o. female with medical history significant forCOPD on home oxygen at 2 L depression, and hearing impairment with a cochlear implant who was brought to the emergency room because of acute confusion.  At baseline, patient is homebound and she and her neighbors checkinon her every day and bring food for her. Her sister last saw her about 3 days ago prior to admission and at that time, her mental status was normal.Apparently on arrival of EMS, she had urinated on herself on the couch and appeared to be staring blankly and they had to lift her into the EMS vehicle.   Reportedly, her condition has been declining for several months and she has been having increasing shortness of breath with exertion even walking from room to room.    There have been some concerns about her continuing to live on her own. She was also found to have a rash on her chest.On arrival of EMS she was apparently satting at 90% on 2 L and she was increased to 4 L.  She was treated with prednisone and bronchodilators for COPD exacerbation.  She was initially treated with empiric IV antibiotics for suspected pneumonia.  However, there was no evidence of infection so antibiotics were discontinued.   There was also concern for acute stroke.  However, repeat CT scan of the head did not show any evidence of acute stroke.  She has an erythematous rash with papules and vesicles on the right upper chest, right axilla and right upper back that is consistent with shingles.  She was started on Valtrex for treatment of shingles.  She also had mild acute kidney injury and was treated with IV fluids.  Her confusion on admission was attributed to acute toxic metabolic encephalopathy.  She was evaluated by PT and OT who recommended further rehabilitation at a skilled nursing facility.       Discharge Exam:   Vitals:   07/19/19 2255 07/20/19 0822  BP: (!) 177/83 (!) 165/77  Pulse: 67 73  Resp: 20 17  Temp: 98.3 F (36.8 C) 98.5 F (36.9 C)  SpO2: 95% 94%   Vitals:   07/19/19 1644 07/19/19 2252 07/19/19 2255 07/20/19 0822  BP: 138/61 (!) 177/83 (!) 177/83 (!) 165/77  Pulse: (!) 54  67 73  Resp: 18  20 17   Temp: 97.6 F (36.4 C)  98.3 F (36.8 C) 98.5 F (36.9 C)  TempSrc: Oral  Axillary   SpO2: 93%  95% 94%  Weight:      Height:         GEN: NAD SKIN: Erythematous vesicular rash on right upper chest, right breast, right axilla, right posterior arm and right upper back.  Small ruptured blisters/erosions noted in these areas as well  EYES: EOMI ENT:  b/l hearing impairment CV: RRR PULM: CTA B ABD: soft, ND, NT, +BS CNS: AAO x 3, non focal EXT: No edema or tenderness   The results of significant diagnostics from this hospitalization (including imaging, microbiology, ancillary and laboratory) are listed below for reference.     Procedures and Diagnostic Studies:   EEG  Result Date: 07/15/2019 Alexis Goodell, MD     07/15/2019  6:22 PM ELECTROENCEPHALOGRAM REPORT Patient: Rachel [name]       Room #: 128A-AA EEG No. ID: [id number] Age: 74 y.o.        Sex: female Requesting Physician: Mal Misty Report Date:  07/15/2019       Interpreting Physician: Alexis Goodell History: Rachel [name] is an 74 y.o. female with altered mental status Medications: Rocephin, Cardizem, Solumedrol, Pravachol, Valtrex, Conditions of Recording:  This is a 21 channel routine scalp EEG performed with bipolar and monopolar montages arranged in accordance to the international 10/20 system of electrode placement. One channel was dedicated to EKG recording. The patient is in the awake state. Description:  The background activity is slow and poorly organiized.  It consists low voltage, irregular activity along the delta-theta continuum.  This activity is diffusely organized and continuous.  No epileptiform activity is noted. Hyperventilation was not performed.  Intermittent photic stimulation was performed but failed to illicit any change in the tracing. IMPRESSION: This is an abnormal EEG secondary to general background slowing.  This finding may be seen with a diffuse disturbance that is etiologically nonspecific, but may include a metabolic encephalopathy, among other possibilities.  No epileptiform activity was noted.  Alexis Goodell, MD Neurology [phone] 07/15/2019, 6:13 PM   CT HEAD WO CONTRAST  Result Date: 07/15/2019 CLINICAL DATA:  Ataxia, stroke suspected EXAM: CT HEAD WITHOUT CONTRAST TECHNIQUE: Contiguous axial images were obtained from the base of the skull through the vertex without intravenous contrast. COMPARISON:  May 18, 2011 FINDINGS: Brain: No evidence of acute territorial infarction, hemorrhage, hydrocephalus,extra-axial collection or mass lesion/mass effect. Somewhat limited due to large amount of overlying artifact likely secondary from cochlear implant. There is dilatation the ventricles and sulci consistent with age-related atrophy. Low-attenuation changes in the deep white matter consistent with small vessel ischemia. Vascular: No hyperdense vessel or unexpected calcification. Skull: The skull is intact. No fracture or focal lesion identified. Sinuses/Orbits: The visualized paranasal  sinuses and mastoid air cells are clear. The orbits and globes intact. Other: None IMPRESSION: No acute intracranial abnormality. Significant over artifact from overlying right parietal cochlear implant. Findings consistent with age related atrophy and chronic small vessel ischemia Electronically Signed   By: Prudencio Pair M.D.   On: 07/15/2019 01:12   DG Chest Portable 1 View  Result Date: 07/14/2019 CLINICAL DATA:  Shortness of breath. EXAM: PORTABLE CHEST 1 VIEW COMPARISON:  09/09/2018 FINDINGS: Again noted is cardiomegaly with findings suspicious for underlying interstitial lung disease. Scattered bilateral areas of scarring and atelectasis are again noted and are essentially stable from prior study. There is no significant pleural effusion or pneumothorax. There is no acute displaced fracture. Aortic calcifications are noted. IMPRESSION: Again noted are chronic findings bilaterally as detailed above which may represent underlying interstitial lung disease. A superimposed infectious process is difficult to entirely exclude on this exam. Electronically Signed   By: Constance Holster M.D.   On: 07/14/2019 20:59     Labs:   Basic Metabolic Panel: Recent Labs  Lab 07/14/19 2034 07/14/19 2034 07/15/19 1430 07/15/19 1430 07/16/19 0829 07/17/19 [id number]  NA 136  --  133*  --  135 137  K 4.3   < > 4.1   < > 4.1 4.5  CL 99  --  100  --  104 105  CO2 25  --  24  --  23 23  GLUCOSE 125*  --  151*  --  164* 127*  BUN 41*  --  49*  --  52* 51*  CREATININE 1.26*  --  1.29*  --  1.12* 0.93  CALCIUM 9.3  --  9.0  --  8.6* 8.5*   < > = values in this interval not displayed.   GFR Estimated Creatinine Clearance: 68.7 mL/min (by C-G formula based on SCr of 0.93 mg/dL). Liver Function Tests: Recent Labs  Lab 07/14/19 2034  AST 64*  ALT 31  ALKPHOS 75  BILITOT 0.8  PROT 8.3*  ALBUMIN 4.4   No results for input(s): LIPASE, AMYLASE in the last 168 hours. No results for input(s): AMMONIA in the  last 168 hours. Coagulation profile No results for input(s): INR, PROTIME in the last 168 hours.  CBC: Recent Labs  Lab 07/14/19 2034 07/15/19 0036  WBC 5.2 5.4  HGB 15.4* 14.5  HCT 45.9 43.0  MCV 92.5 91.7  PLT 131* 114*   Cardiac Enzymes: No results for input(s): CKTOTAL, CKMB, CKMBINDEX, TROPONINI in the last 168 hours. BNP: Invalid input(s): POCBNP CBG: No results for input(s): GLUCAP in the last 168 hours. D-Dimer No results for input(s): DDIMER in the last 72 hours. Hgb A1c No results for input(s): HGBA1C in the last 72 hours. Lipid Profile No results for input(s): CHOL, HDL, LDLCALC, TRIG, CHOLHDL, LDLDIRECT in the last 72 hours. Thyroid function studies No results for input(s): TSH, T4TOTAL, T3FREE, THYROIDAB in the last 72 hours.  Invalid input(s): FREET3 Anemia work up No results for input(s): VITAMINB12, FOLATE, FERRITIN, TIBC, IRON, RETICCTPCT in the last 72 hours. Microbiology Recent Results (from the past 240 hour(s))  SARS CORONAVIRUS 2 (TAT 6-24 HRS) Nasopharyngeal Nasopharyngeal Swab     Status: None   Collection Time: 07/14/19 11:19 PM   Specimen: Nasopharyngeal Swab  Result Value Ref Range Status   SARS Coronavirus 2 NEGATIVE NEGATIVE Final    Comment: (NOTE) SARS-CoV-2 target nucleic acids are NOT DETECTED. The SARS-CoV-2 RNA is generally detectable in upper and lower respiratory specimens during the acute phase of infection. Negative results do not preclude SARS-CoV-2 infection, do not rule out co-infections with other pathogens, and should not be used as the sole basis for treatment or other patient management decisions. Negative results must be combined with clinical observations, patient history, and epidemiological information. The expected result is Negative. Fact Sheet for Patients: SugarRoll.be Fact Sheet for Healthcare Providers: https://www.woods-mathews.com/ This test is not yet approved or  cleared by the [place] FDA and  has been authorized for detection and/or diagnosis of SARS-CoV-2 by FDA under an Emergency Use Authorization (EUA). This EUA will remain  in effect (meaning this test can be used) for the duration of the COVID-19 declaration under Section 56 4(b)(1) of the Act, 21 U.S.C. section 360bbb-3(b)(1), unless the authorization is terminated or revoked sooner. Performed at [facility], [place] [address] [place], [place] [address]   Culture, blood (routine x 2) Call MD if unable to obtain prior to antibiotics being given     Status: None   Collection Time: 07/15/19 12:36 AM   Specimen: BLOOD  Result Value Ref Range Status   Specimen Description BLOOD  RIGHT HAND  Final   Special Requests   Final    BOTTLES DRAWN AEROBIC AND ANAEROBIC Blood Culture adequate volume   Culture   Final    NO GROWTH 5 DAYS Performed at [facility], [address] [place], [place] [address]    Report Status 07/20/2019 FINAL  Final  Culture, blood (routine x 2) Call MD if unable to obtain prior to antibiotics being given     Status: None   Collection Time: 07/15/19 12:36 AM   Specimen: BLOOD  Result Value Ref Range Status   Specimen Description BLOOD LEFT [facility]  Final   Special Requests   Final    BOTTLES DRAWN AEROBIC AND ANAEROBIC Blood Culture adequate volume   Culture   Final    NO GROWTH 5 DAYS Performed at [facility], [address] [place], [place] [address]    Report Status 07/20/2019 FINAL  Final  Respiratory Panel by RT PCR (Flu A&B, Covid) - Nasopharyngeal Swab     Status: None   Collection Time: 07/19/19 10:28 AM   Specimen: Nasopharyngeal Swab  Result Value Ref Range Status   SARS Coronavirus 2 by RT PCR NEGATIVE NEGATIVE Final    Comment: (NOTE) SARS-CoV-2 target nucleic acids are NOT DETECTED. The SARS-CoV-2 RNA is generally detectable in upper respiratoy specimens during the acute phase of infection. The lowest concentration of  SARS-CoV-2 viral copies this assay can detect is 131 copies/mL. A negative result does not preclude SARS-Cov-2 infection and should not be used as the sole basis for treatment or other patient management decisions. A negative result may occur with  improper specimen collection/handling, submission of specimen other than nasopharyngeal swab, presence of viral mutation(s) within the areas targeted by this assay, and inadequate number of viral copies (<131 copies/mL). A negative result must be combined with clinical observations, patient history, and epidemiological information. The expected result is Negative. Fact Sheet for Patients:  PinkCheek.be Fact Sheet for Healthcare Providers:  GravelBags.it This test is not yet ap proved or cleared by the [place] FDA and  has been authorized for detection and/or diagnosis of SARS-CoV-2 by FDA under an Emergency Use Authorization (EUA). This EUA will remain  in effect (meaning this test can be used) for the duration of the COVID-19 declaration under Section 564(b)(1) of the Act, 21 U.S.C. section 360bbb-3(b)(1), unless the authorization is terminated or revoked sooner.    Influenza A by PCR NEGATIVE NEGATIVE Final   Influenza B by PCR NEGATIVE NEGATIVE Final    Comment: (NOTE) The Xpert Xpress SARS-CoV-2/FLU/RSV assay is intended as an aid in  the diagnosis of influenza from Nasopharyngeal swab specimens and  should not be used as a sole basis for treatment. Nasal washings and  aspirates are unacceptable for Xpert Xpress SARS-CoV-2/FLU/RSV  testing. Fact Sheet for Patients: PinkCheek.be Fact Sheet for Healthcare Providers: GravelBags.it This test is not yet approved or cleared by the [place] FDA and  has been authorized for detection and/or diagnosis of SARS-CoV-2 by  FDA under an Emergency Use Authorization (EUA).  This EUA will remain  in effect (meaning this test can be used) for the duration of the  Covid-19 declaration under Section 564(b)(1) of the Act, 21  U.S.C. section 360bbb-3(b)(1), unless the authorization is  terminated or revoked. Performed at [facility], [address], [place], [place] [address]      Discharge Instructions:   Discharge Instructions    Diet - low sodium heart healthy  Complete by: As directed    Discharge instructions   Complete by: As directed    Follow up with PCP within 3 days of discharge   Increase activity slowly   Complete by: As directed      Allergies as of 07/20/2019   No Known Allergies     Medication List    STOP taking these medications   escitalopram 10 MG tablet Commonly known as: LEXAPRO   pantoprazole 40 MG tablet Commonly known as: PROTONIX   predniSONE 10 MG (21) Tbpk tablet Commonly known as: STERAPRED UNI-PAK 21 TAB     TAKE these medications   acetaminophen 325 MG tablet Commonly known as: TYLENOL Take 2 tablets (650 mg total) by mouth every 6 (six) hours as needed for mild pain.   albuterol 108 (90 Base) MCG/ACT inhaler Commonly known as: VENTOLIN HFA Inhale 1-2 puffs into the lungs every 4 (four) hours as needed for wheezing or shortness of breath.   diltiazem 120 MG tablet Commonly known as: CARDIZEM Take 120 mg by mouth 3 (three) times daily.   hydrALAZINE 25 MG tablet Commonly known as: APRESOLINE Take 25 mg by mouth 3 (three) times daily.   lisinopril 20 MG tablet Commonly known as: ZESTRIL Take 1 tablet (20 mg total) by mouth daily.   lovastatin 20 MG tablet Commonly known as: MEVACOR Take 20 mg by mouth every morning.   umeclidinium-vilanterol 62.5-25 MCG/INH Aepb Commonly known as: Anoro Ellipta Inhale 1 puff into the lungs daily.   valACYclovir 1000 MG tablet Commonly known as: VALTREX Take 1 tablet (1,000 mg total) by mouth 2 (two) times daily for 2 days.   zolpidem 10 MG  tablet Commonly known as: AMBIEN Take 1 tablet (10 mg total) by mouth at bedtime.      Contact information for after-discharge care    Destination    [facility] .   Service: Skilled Nursing Contact information: [address] [place] [phone]               Time coordinating discharge: 28 minutes  Signed:  Ferlin Fairhurst  [facility] 07/20/2019, 10:39 AM

## 2019-07-20 NOTE — TOC Transition Note (Signed)
Transition of Care [facility]) - CM/SW Discharge Note   Patient Details  Name: Rachel [name] MRN: [id number] Date of Birth: 1945/10/04  [facility] CM/SW Contact:  Elease Hashimoto, LCSW Phone Number: 07/20/2019, 8:20 AM   Clinical Narrative:  Pt has been medically cleared and insurance has authorized pt to go to [facility]. Auth number [id number] for three days starting today with update 3/11 to [place] fax [phone]. Ref number [id number]. COVID test negative. [facility] ADM aware and will plan for transfer late morning.    10:10 am Bedside RN feels needs until 12:00 to get her ready-have called Ems for 12:00 pick up  Going [facility] room B12 [facility]   Final next level of care: [place] Barriers to Discharge: Barriers Resolved   Patient Goals and CMS Choice Patient states their goals for this hospitalization and ongoing recovery are:: Pt is in a contact room and not answering the phone. Obtained information from sister-Tracy and son-Michael      Discharge Placement PASRR number recieved: 07/16/19            Patient chooses bed at: [facility] [place] Patient to be transferred to facility by: EMS Name of family member notified: Azzie Glatter and Radio broadcast assistant Patient and family notified of of transfer: 07/20/19  Discharge Plan and Services In-house Referral: Clinical Social Work Discharge Planning Services: NA Post Acute Care Choice: [place]                               Social Determinants of Health (SDOH) Interventions     Readmission Risk Interventions No flowsheet data found.

## 2019-07-20 NOTE — Progress Notes (Signed)
Pt left in care of EMS to [facility] in [place] in care of EMS.

## 2020-07-17 ENCOUNTER — Emergency Department
Admission: EM | Admit: 2020-07-17 | Discharge: 2020-07-17 | Disposition: A | Discharge status: Home / Self Care | Payer: Medicare Other | Attending: Emergency Medicine | Admitting: Emergency Medicine

## 2020-07-17 ENCOUNTER — Other Ambulatory Visit: Payer: Self-pay

## 2020-07-17 ENCOUNTER — Emergency Department: Payer: Medicare Other

## 2020-07-17 DIAGNOSIS — R0602 Shortness of breath: Secondary | ICD-10-CM | POA: Diagnosis present

## 2020-07-17 DIAGNOSIS — Z79899 Other long term (current) drug therapy: Secondary | ICD-10-CM | POA: Insufficient documentation

## 2020-07-17 DIAGNOSIS — J984 Other disorders of lung: Secondary | ICD-10-CM | POA: Diagnosis not present

## 2020-07-17 DIAGNOSIS — J441 Chronic obstructive pulmonary disease with (acute) exacerbation: Secondary | ICD-10-CM | POA: Diagnosis not present

## 2020-07-17 DIAGNOSIS — Z87891 Personal history of nicotine dependence: Secondary | ICD-10-CM | POA: Diagnosis not present

## 2020-07-17 DIAGNOSIS — I10 Essential (primary) hypertension: Secondary | ICD-10-CM | POA: Diagnosis not present

## 2020-07-17 LAB — CBC
HCT: 38.4 % (ref 36.0–46.0)
Hemoglobin: 12.5 g/dL (ref 12.0–15.0)
MCH: 30.2 pg (ref 26.0–34.0)
MCHC: 32.6 g/dL (ref 30.0–36.0)
MCV: 92.8 fL (ref 80.0–100.0)
Platelets: 155 10*3/uL (ref 150–400)
RBC: 4.14 MIL/uL (ref 3.87–5.11)
RDW: 14.4 % (ref 11.5–15.5)
WBC: 10.9 10*3/uL — ABNORMAL HIGH (ref 4.0–10.5)
nRBC: 0 % (ref 0.0–0.2)

## 2020-07-17 LAB — TROPONIN I (HIGH SENSITIVITY)
Troponin I (High Sensitivity): 17 ng/L (ref <18)
Troponin I (High Sensitivity): 15 ng/L (ref <18)

## 2020-07-17 LAB — BASIC METABOLIC PANEL
Anion gap: 8 (ref 5–15)
BUN: 37 mg/dL — ABNORMAL HIGH (ref 8–23)
CO2: 17 mmol/L — ABNORMAL LOW (ref 22–32)
Calcium: 8.7 mg/dL — ABNORMAL LOW (ref 8.9–10.3)
Chloride: 110 mmol/L (ref 98–111)
Creatinine, Ser: 0.86 mg/dL (ref 0.44–1.00)
GFR, Estimated: >60 mL/min (ref >60)
Glucose, Bld: 95 mg/dL (ref 70–99)
Potassium: 5.2 mmol/L — ABNORMAL HIGH (ref 3.5–5.1)
Sodium: 135 mmol/L (ref 135–145)

## 2020-07-17 LAB — BRAIN NATRIURETIC PEPTIDE: B Natriuretic Peptide: 104.9 pg/mL — ABNORMAL HIGH (ref 0.0–100.0)

## 2020-07-17 MED ORDER — METHYLPREDNISOLONE 4 MG PO TBPK: ORAL_TABLET | ORAL | 0 refills | Status: DC

## 2020-07-17 NOTE — ED Notes (Signed)
Called [facility] to transport to [place]

## 2020-07-17 NOTE — ED Notes (Signed)
Patient given sandwich tray and water

## 2020-07-17 NOTE — ED Triage Notes (Signed)
Pt comes into the ED via EMS from [facility] assisted living, pt in the axillary area , covid + 2/21, per staff gained 25lb in the past 4 days  CBG  107 2L [place] continuous home O2 96% 140/50 64HR
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
## 2021-09-15 IMAGING — CR DG CHEST 2V
2 series · 2 of 2 positions shown · non-contrast
Comparison: July 16, 2019

CLINICAL DATA: COVID positive. Gained 25 pounds in the past 4 days.

EXAM:
CHEST - 2 VIEW

[chest pa]
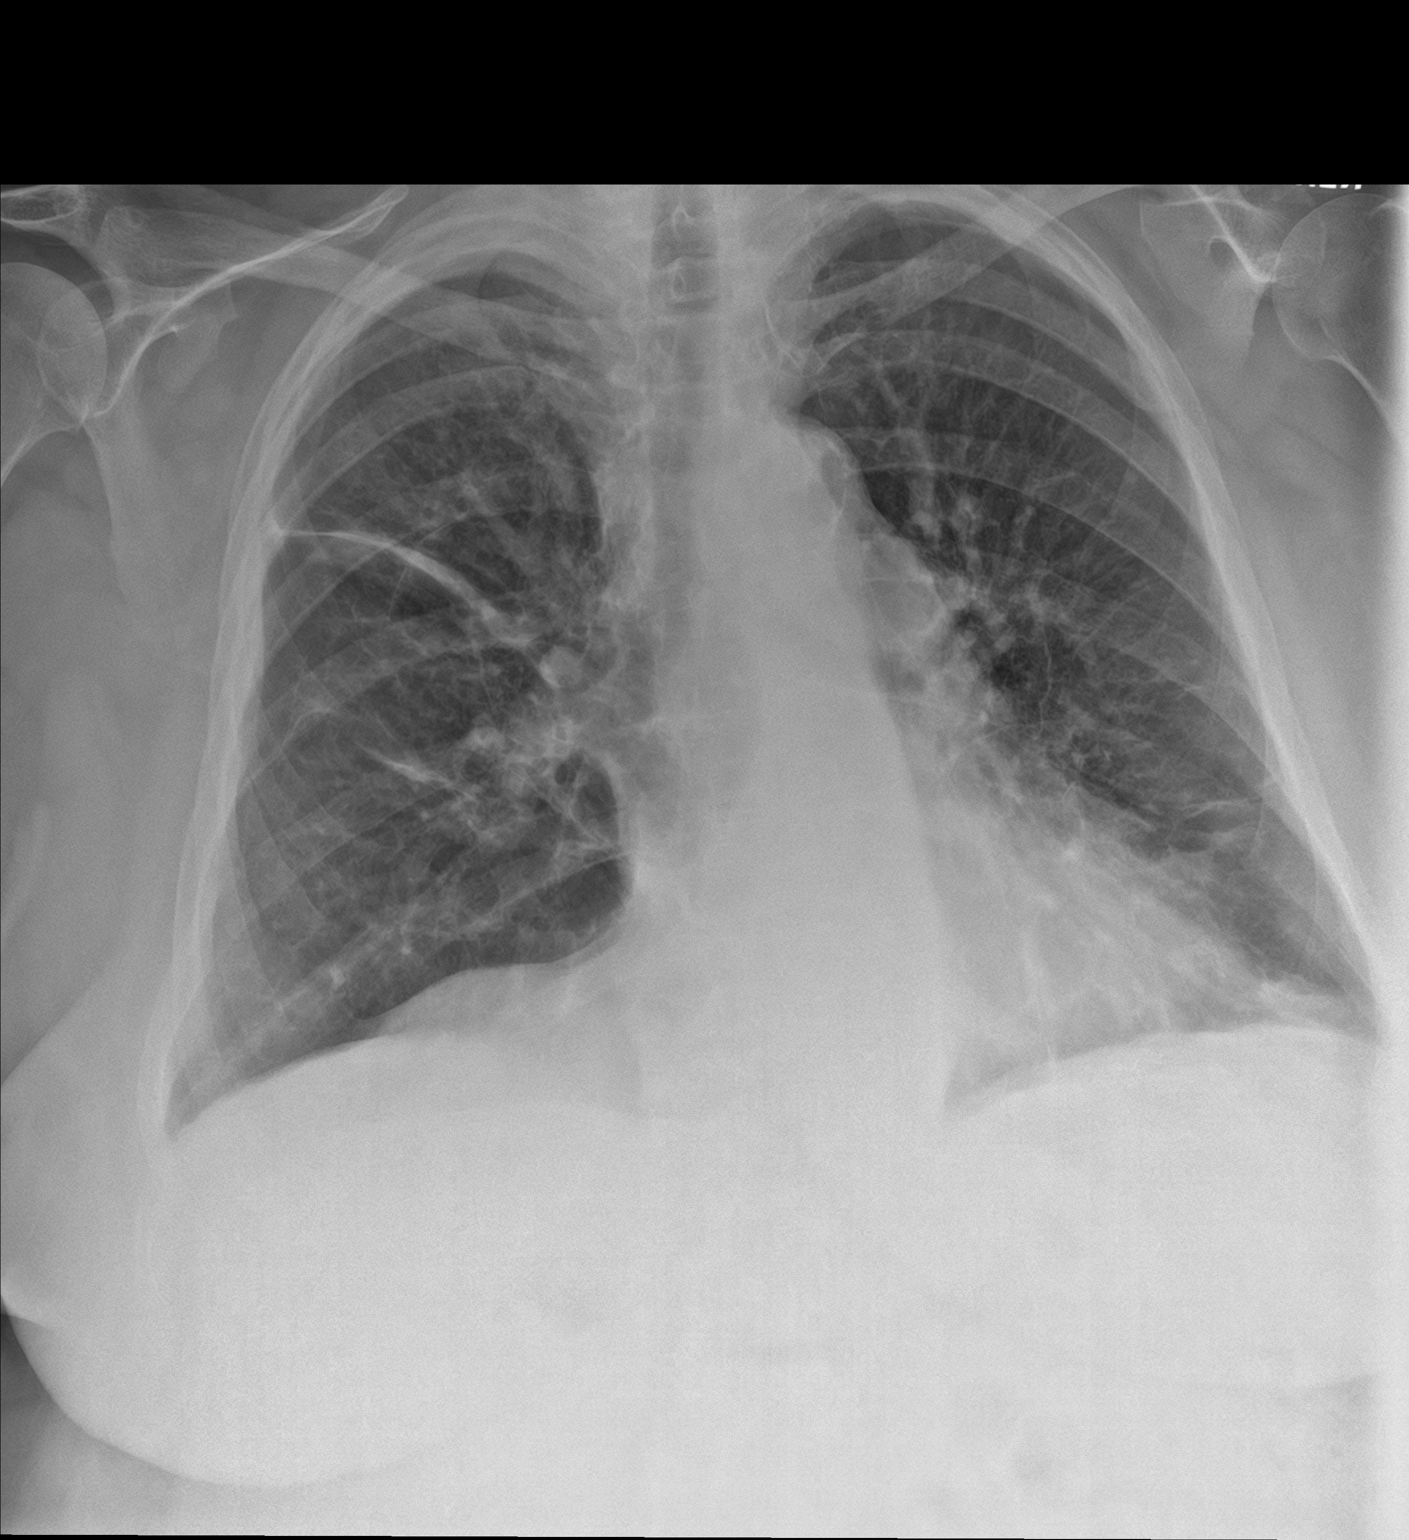

[chest lat]
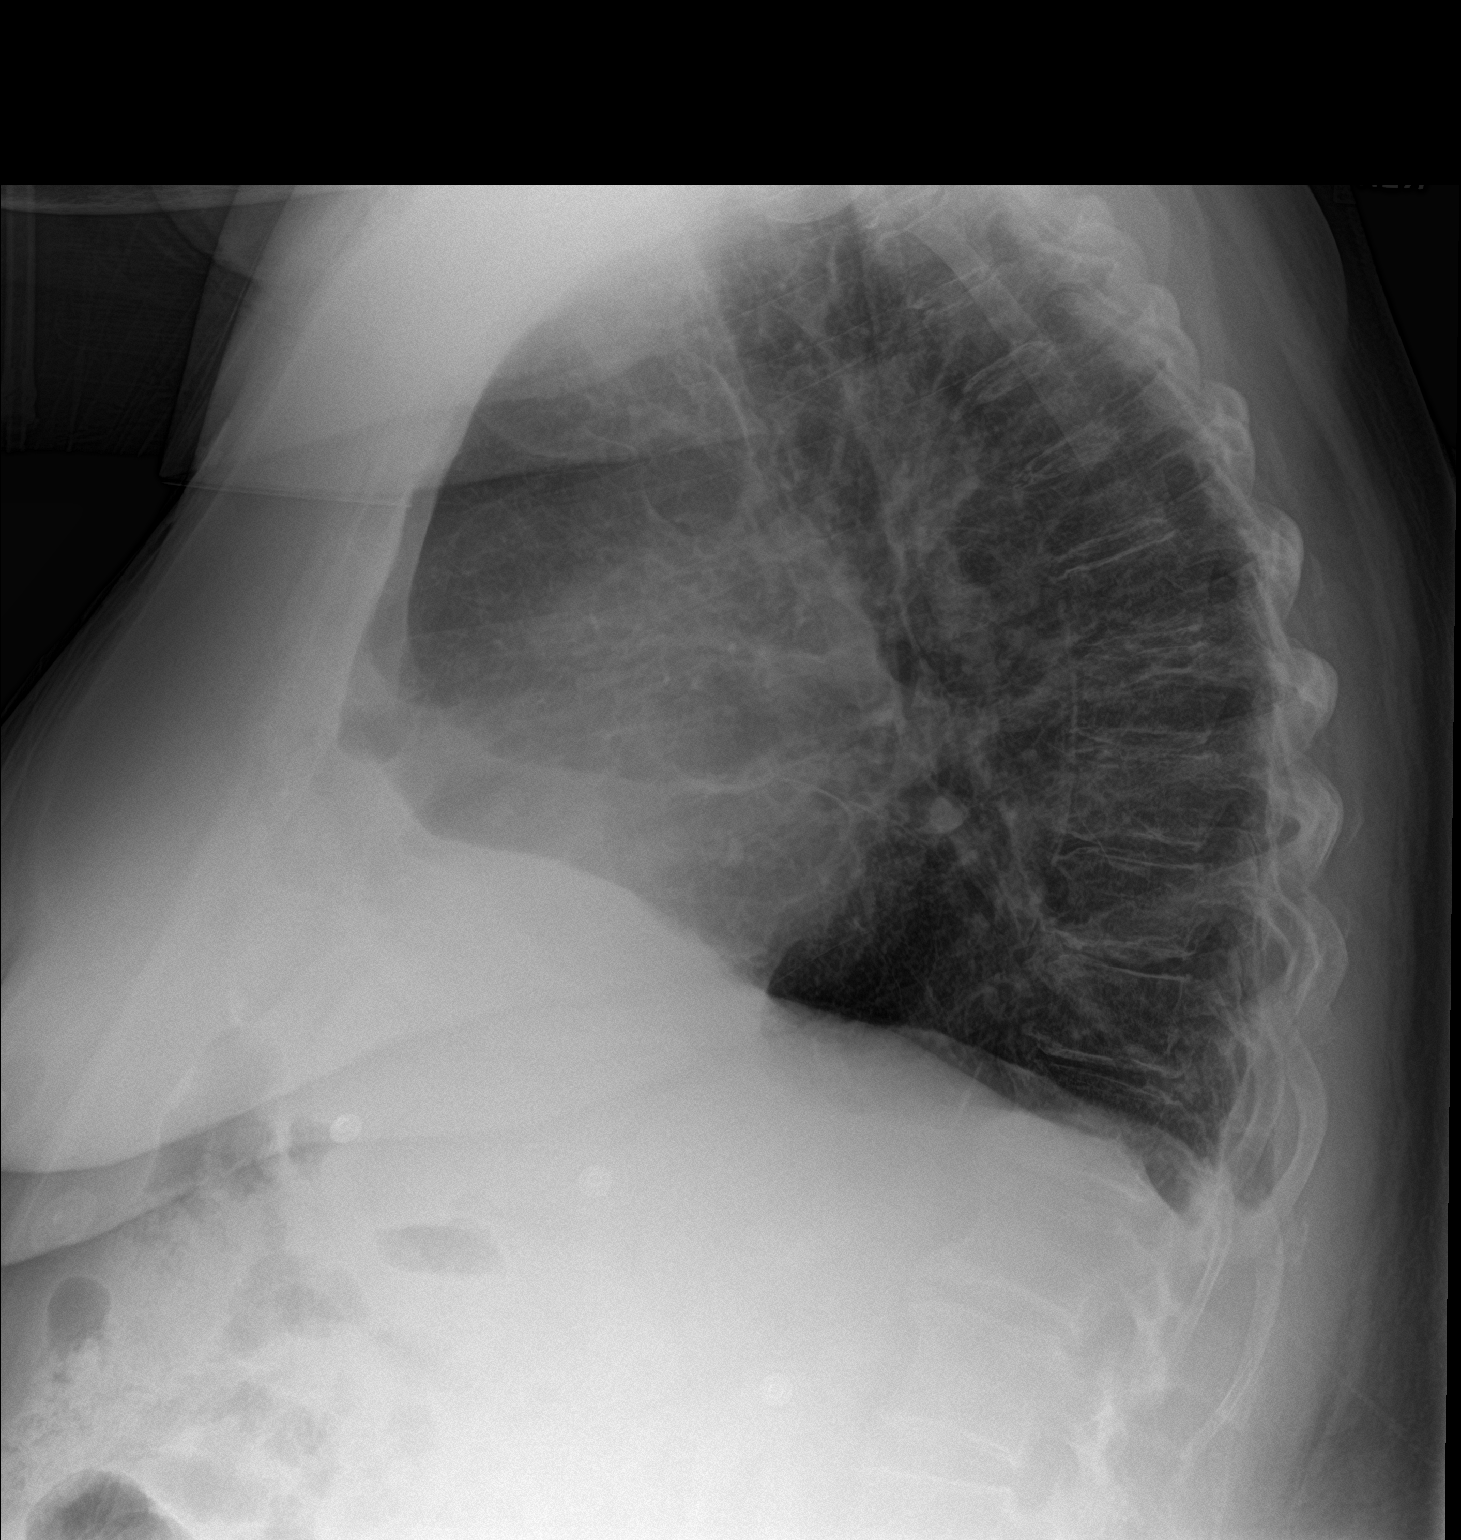

[2 of 2 positions shown; findings below may reference images not displayed]

FINDINGS: Mild diffusely increased interstitial lung markings are seen. Stable
scarring and/or atelectasis is seen within the mid right lung. Mild
left basilar atelectasis and/or infiltrate is also noted. This is
decreased in severity when compared to the prior study. There is no
evidence of a pleural effusion or pneumothorax. The heart size and
mediastinal contours are within normal limits. There is moderate
severity calcification of the aortic arch. The visualized skeletal
structures are unremarkable.
IMPRESSION: 1. Stable mid right lung scarring and/or atelectasis.
2. Mild left basilar atelectasis and/or infiltrate, decreased in
severity when compared to the prior study.

## 2021-11-16 ENCOUNTER — Encounter: Payer: Self-pay | Admitting: Emergency Medicine

## 2021-11-16 ENCOUNTER — Inpatient Hospital Stay
Admission: EM | Admit: 2021-11-16 | Discharge: 2021-11-20 | DRG: 190 | Disposition: A | Discharge status: Skilled Nursing Facility | Payer: Medicare Other | Source: Skilled Nursing Facility | Attending: Internal Medicine | Admitting: Internal Medicine

## 2021-11-16 ENCOUNTER — Emergency Department: Payer: Medicare Other

## 2021-11-16 ENCOUNTER — Other Ambulatory Visit: Payer: Self-pay

## 2021-11-16 ENCOUNTER — Observation Stay (HOSPITAL_COMMUNITY)
Admit: 2021-11-16 | Discharge: 2021-11-16 | Disposition: A | Discharge status: Home / Self Care | Payer: Medicare Other | Attending: Internal Medicine | Admitting: Internal Medicine

## 2021-11-16 DIAGNOSIS — N179 Acute kidney failure, unspecified: Secondary | ICD-10-CM | POA: Diagnosis present

## 2021-11-16 DIAGNOSIS — Z79899 Other long term (current) drug therapy: Secondary | ICD-10-CM

## 2021-11-16 DIAGNOSIS — Z801 Family history of malignant neoplasm of trachea, bronchus and lung: Secondary | ICD-10-CM | POA: Diagnosis not present

## 2021-11-16 DIAGNOSIS — Z818 Family history of other mental and behavioral disorders: Secondary | ICD-10-CM

## 2021-11-16 DIAGNOSIS — E785 Hyperlipidemia, unspecified: Secondary | ICD-10-CM

## 2021-11-16 DIAGNOSIS — Z87891 Personal history of nicotine dependence: Secondary | ICD-10-CM | POA: Diagnosis not present

## 2021-11-16 DIAGNOSIS — J441 Chronic obstructive pulmonary disease with (acute) exacerbation: Secondary | ICD-10-CM | POA: Diagnosis present

## 2021-11-16 DIAGNOSIS — I5032 Chronic diastolic (congestive) heart failure: Secondary | ICD-10-CM | POA: Diagnosis present

## 2021-11-16 DIAGNOSIS — I5031 Acute diastolic (congestive) heart failure: Secondary | ICD-10-CM | POA: Diagnosis not present

## 2021-11-16 DIAGNOSIS — Z9981 Dependence on supplemental oxygen: Secondary | ICD-10-CM | POA: Diagnosis not present

## 2021-11-16 DIAGNOSIS — H9190 Unspecified hearing loss, unspecified ear: Secondary | ICD-10-CM | POA: Diagnosis present

## 2021-11-16 DIAGNOSIS — Z811 Family history of alcohol abuse and dependence: Secondary | ICD-10-CM | POA: Diagnosis not present

## 2021-11-16 DIAGNOSIS — I10 Essential (primary) hypertension: Secondary | ICD-10-CM | POA: Diagnosis not present

## 2021-11-16 DIAGNOSIS — J9621 Acute and chronic respiratory failure with hypoxia: Secondary | ICD-10-CM | POA: Diagnosis present

## 2021-11-16 DIAGNOSIS — I5033 Acute on chronic diastolic (congestive) heart failure: Secondary | ICD-10-CM

## 2021-11-16 DIAGNOSIS — E875 Hyperkalemia: Secondary | ICD-10-CM

## 2021-11-16 DIAGNOSIS — R0602 Shortness of breath: Secondary | ICD-10-CM | POA: Diagnosis present

## 2021-11-16 DIAGNOSIS — Z20822 Contact with and (suspected) exposure to covid-19: Secondary | ICD-10-CM | POA: Diagnosis present

## 2021-11-16 DIAGNOSIS — I110 Hypertensive heart disease with heart failure: Secondary | ICD-10-CM | POA: Diagnosis present

## 2021-11-16 DIAGNOSIS — Z808 Family history of malignant neoplasm of other organs or systems: Secondary | ICD-10-CM

## 2021-11-16 DIAGNOSIS — Z66 Do not resuscitate: Secondary | ICD-10-CM | POA: Diagnosis present

## 2021-11-16 DIAGNOSIS — R0902 Hypoxemia: Secondary | ICD-10-CM

## 2021-11-16 LAB — COMPREHENSIVE METABOLIC PANEL
ALT: 20 U/L (ref 0–44)
AST: 23 U/L (ref 15–41)
Albumin: 4.1 g/dL (ref 3.5–5.0)
Alkaline Phosphatase: 106 U/L (ref 38–126)
Anion gap: 5 (ref 5–15)
BUN: 33 mg/dL — ABNORMAL HIGH (ref 8–23)
CO2: 24 mmol/L (ref 22–32)
Calcium: 8.7 mg/dL — ABNORMAL LOW (ref 8.9–10.3)
Chloride: 109 mmol/L (ref 98–111)
Creatinine, Ser: 1.05 mg/dL — ABNORMAL HIGH (ref 0.44–1.00)
GFR, Estimated: 55 mL/min — ABNORMAL LOW (ref >60)
Glucose, Bld: 151 mg/dL — ABNORMAL HIGH (ref 70–99)
Potassium: 3.9 mmol/L (ref 3.5–5.1)
Sodium: 138 mmol/L (ref 135–145)
Total Bilirubin: 0.5 mg/dL (ref 0.3–1.2)
Total Protein: 7.6 g/dL (ref 6.5–8.1)

## 2021-11-16 LAB — CBC WITH DIFFERENTIAL/PLATELET
Abs Immature Granulocytes: 0.11 10*3/uL — ABNORMAL HIGH (ref 0.00–0.07)
Basophils Absolute: 0 10*3/uL (ref 0.0–0.1)
Basophils Relative: 0 %
Eosinophils Absolute: 0.3 10*3/uL (ref 0.0–0.5)
Eosinophils Relative: 2 %
HCT: 39.1 % (ref 36.0–46.0)
Hemoglobin: 12.3 g/dL (ref 12.0–15.0)
Immature Granulocytes: 1 %
Lymphocytes Relative: 13 %
Lymphs Abs: 2.1 10*3/uL (ref 0.7–4.0)
MCH: 30.8 pg (ref 26.0–34.0)
MCHC: 31.5 g/dL (ref 30.0–36.0)
MCV: 98 fL (ref 80.0–100.0)
Monocytes Absolute: 0.8 10*3/uL (ref 0.1–1.0)
Monocytes Relative: 5 %
Neutro Abs: 13 10*3/uL — ABNORMAL HIGH (ref 1.7–7.7)
Neutrophils Relative %: 79 %
Platelets: 210 10*3/uL (ref 150–400)
RBC: 3.99 MIL/uL (ref 3.87–5.11)
RDW: 12.9 % (ref 11.5–15.5)
WBC: 16.4 10*3/uL — ABNORMAL HIGH (ref 4.0–10.5)
nRBC: 0 % (ref 0.0–0.2)

## 2021-11-16 LAB — SARS CORONAVIRUS 2 BY RT PCR: SARS Coronavirus 2 by RT PCR: NEGATIVE

## 2021-11-16 LAB — ECHOCARDIOGRAM COMPLETE
AR max vel: 1.64 cm2
AV Area VTI: 1.75 cm2
AV Area mean vel: 1.52 cm2
AV Mean grad: 12 mmHg
AV Peak grad: 22.1 mmHg
Ao pk vel: 2.35 m/s
Area-P 1/2: 5.79 cm2
Calc EF: 55.1 %
Height: 66 in
Single Plane A2C EF: 53.2 %
Single Plane A4C EF: 65.1 %
Weight: 2960 oz

## 2021-11-16 LAB — PROCALCITONIN: Procalcitonin: <0.1 ng/mL

## 2021-11-16 LAB — TROPONIN I (HIGH SENSITIVITY)
Troponin I (High Sensitivity): 10 ng/L (ref <18)
Troponin I (High Sensitivity): 12 ng/L (ref <18)

## 2021-11-16 LAB — BRAIN NATRIURETIC PEPTIDE: B Natriuretic Peptide: 40 pg/mL (ref 0.0–100.0)

## 2021-11-16 LAB — MAGNESIUM: Magnesium: 1.7 mg/dL (ref 1.7–2.4)

## 2021-11-16 MED ORDER — HYDRALAZINE HCL 25 MG PO TABS
25.0000 mg | ORAL_TABLET | Freq: Three times a day (TID) | ORAL | Status: DC
  Administered 2021-11-16 – 2021-11-18 (×6): 25 mg via ORAL
  Filled 2021-11-16 (×8): qty 1

## 2021-11-16 MED ORDER — ALBUTEROL SULFATE (2.5 MG/3ML) 0.083% IN NEBU
2.5000 mg | INHALATION_SOLUTION | RESPIRATORY_TRACT | Status: DC | PRN
Start: 2021-11-16 — End: 2021-11-17

## 2021-11-16 MED ORDER — DILTIAZEM HCL 30 MG PO TABS
120.0000 mg | ORAL_TABLET | Freq: Three times a day (TID) | ORAL | Status: DC
Start: 2021-11-16 — End: 2021-11-18
  Administered 2021-11-16 – 2021-11-18 (×6): 120 mg via ORAL
  Filled 2021-11-16 (×4): qty 4
  Filled 2021-11-16: qty 2
  Filled 2021-11-16 (×2): qty 4

## 2021-11-16 MED ORDER — ACETAMINOPHEN 325 MG PO TABS
650.0000 mg | ORAL_TABLET | Freq: Four times a day (QID) | ORAL | Status: DC | PRN
  Administered 2021-11-16 – 2021-11-20 (×8): 650 mg via ORAL
  Filled 2021-11-16 (×9): qty 2

## 2021-11-16 MED ORDER — MEMANTINE HCL 5 MG PO TABS
10.0000 mg | ORAL_TABLET | Freq: Two times a day (BID) | ORAL | Status: DC
  Administered 2021-11-16 – 2021-11-20 (×10): 10 mg via ORAL
  Filled 2021-11-16 (×10): qty 2

## 2021-11-16 MED ORDER — ACETAMINOPHEN 650 MG RE SUPP: 650.0000 mg | Freq: Four times a day (QID) | RECTAL | Status: DC | PRN

## 2021-11-16 MED ORDER — PERFLUTREN LIPID MICROSPHERE
1.0000 mL | INTRAVENOUS | Status: AC | PRN
  Administered 2021-11-16: 2 mL via INTRAVENOUS

## 2021-11-16 MED ORDER — METHYLPREDNISOLONE SODIUM SUCC 40 MG IJ SOLR
40.0000 mg | Freq: Two times a day (BID) | INTRAMUSCULAR | Status: AC
  Administered 2021-11-16 (×2): 40 mg via INTRAVENOUS
  Filled 2021-11-16 (×2): qty 1

## 2021-11-16 MED ORDER — PRAVASTATIN SODIUM 20 MG PO TABS
10.0000 mg | ORAL_TABLET | Freq: Every day | ORAL | Status: DC
  Administered 2021-11-16 – 2021-11-20 (×5): 10 mg via ORAL
  Filled 2021-11-16 (×5): qty 1

## 2021-11-16 MED ORDER — GUAIFENESIN ER 600 MG PO TB12
600.0000 mg | ORAL_TABLET | Freq: Two times a day (BID) | ORAL | Status: DC
  Administered 2021-11-16 – 2021-11-20 (×10): 600 mg via ORAL
  Filled 2021-11-16 (×10): qty 1

## 2021-11-16 MED ORDER — IPRATROPIUM-ALBUTEROL 0.5-2.5 (3) MG/3ML IN SOLN
6.0000 mL | Freq: Once | RESPIRATORY_TRACT | Status: AC
  Administered 2021-11-16: 6 mL via RESPIRATORY_TRACT
  Filled 2021-11-16: qty 6

## 2021-11-16 MED ORDER — ENOXAPARIN SODIUM 40 MG/0.4ML IJ SOSY
40.0000 mg | PREFILLED_SYRINGE | INTRAMUSCULAR | Status: DC
  Administered 2021-11-16 – 2021-11-20 (×5): 40 mg via SUBCUTANEOUS
  Filled 2021-11-16 (×5): qty 0.4

## 2021-11-16 MED ORDER — LISINOPRIL 20 MG PO TABS
20.0000 mg | ORAL_TABLET | Freq: Every day | ORAL | Status: DC
  Administered 2021-11-16 – 2021-11-18 (×3): 20 mg via ORAL
  Filled 2021-11-16: qty 1
  Filled 2021-11-16: qty 2
  Filled 2021-11-16: qty 1

## 2021-11-16 MED ORDER — FLUOXETINE HCL 10 MG PO CAPS
10.0000 mg | ORAL_CAPSULE | Freq: Every day | ORAL | Status: DC
  Administered 2021-11-16 – 2021-11-20 (×5): 10 mg via ORAL
  Filled 2021-11-16 (×5): qty 1

## 2021-11-16 MED ORDER — ALBUTEROL SULFATE HFA 108 (90 BASE) MCG/ACT IN AERS: 1.0000 | INHALATION_SPRAY | RESPIRATORY_TRACT | Status: DC | PRN

## 2021-11-16 MED ORDER — PANTOPRAZOLE SODIUM 40 MG PO TBEC
40.0000 mg | DELAYED_RELEASE_TABLET | Freq: Every day | ORAL | Status: DC
  Administered 2021-11-16 – 2021-11-20 (×5): 40 mg via ORAL
  Filled 2021-11-16 (×5): qty 1

## 2021-11-16 MED ORDER — ONDANSETRON HCL 4 MG/2ML IJ SOLN: 4.0000 mg | Freq: Four times a day (QID) | INTRAMUSCULAR | Status: DC | PRN

## 2021-11-16 MED ORDER — GABAPENTIN 100 MG PO CAPS
100.0000 mg | ORAL_CAPSULE | Freq: Two times a day (BID) | ORAL | Status: DC
  Administered 2021-11-16 – 2021-11-20 (×10): 100 mg via ORAL
  Filled 2021-11-16 (×10): qty 1

## 2021-11-16 MED ORDER — SENNA 8.6 MG PO TABS
1.0000 | ORAL_TABLET | Freq: Every morning | ORAL | Status: DC
  Administered 2021-11-17 – 2021-11-20 (×4): 8.6 mg via ORAL
  Filled 2021-11-16 (×3): qty 1

## 2021-11-16 MED ORDER — PREDNISONE 20 MG PO TABS
40.0000 mg | ORAL_TABLET | Freq: Every day | ORAL | Status: DC
  Administered 2021-11-17: 40 mg via ORAL
  Filled 2021-11-16: qty 2

## 2021-11-16 MED ORDER — IPRATROPIUM-ALBUTEROL 0.5-2.5 (3) MG/3ML IN SOLN
3.0000 mL | Freq: Four times a day (QID) | RESPIRATORY_TRACT | Status: DC
  Administered 2021-11-16 – 2021-11-17 (×6): 3 mL via RESPIRATORY_TRACT
  Filled 2021-11-16 (×6): qty 3

## 2021-11-16 MED ORDER — ONDANSETRON HCL 4 MG PO TABS: 4.0000 mg | ORAL_TABLET | Freq: Four times a day (QID) | ORAL | Status: DC | PRN

## 2021-11-16 MED ORDER — FUROSEMIDE 10 MG/ML IJ SOLN
20.0000 mg | Freq: Once | INTRAMUSCULAR | Status: AC
  Administered 2021-11-16: 20 mg via INTRAVENOUS
  Filled 2021-11-16: qty 4

## 2021-11-16 NOTE — ED Provider Notes (Signed)
[facility] Provider Note    Event Date/Time   First MD Initiated Contact with Patient 11/16/21 0159     (approximate)   History   Shortness of Breath   HPI  Rachel [name] is a 76 y.o. female who presents to the ED for evaluation of Shortness of Breath   I reviewed DC summary from medical admission in 2021 where she was admitted for COPD exacerbation.   Patient presents to the ED today from her local SNF for evaluation of shortness of breath for the past couple hours.  She is reportedly on 4 L nasal cannula at baseline, found to be hypoxic to the lower 80s while on 5 L nasal cannula with EMS, requiring NRB prehospital.  Denies any pain, fevers or emesis.  History is somewhat limited due to her respiratory status on arrival.   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  Physical Exam   Triage Vital Signs: ED Triage Vitals  Enc Vitals Group     BP      Pulse      Resp      Temp      Temp src      SpO2      Weight      Height      Head Circumference      Peak Flow      Pain Score      Pain Loc      Pain Edu?      Excl. in [place]?     Most recent vital signs: Vitals:   11/16/21 0300 11/16/21 0330  BP: (!) 143/50 (!) 146/55  Pulse: 97 99  Resp: (!) 23 17  Temp:    SpO2: 99% 96%    General: Awake, no distress.  Obese, sitting up in bed and obviously tachypneic and dyspneic.  Occasional nonproductive coughing. CV:  Good peripheral perfusion.  Bounding symmetric pulses Resp:  Tachypneic to about 30, very poor airflow throughout.  End expiratory wheezing is faint Abd:  No distention.  Soft MSK:  No deformity noted.  Pitting edema to bilateral lower extremities symmetrically without overlying skin changes Neuro:  No focal deficits appreciated. Other:     ED Results / Procedures / Treatments   Labs (all labs ordered are listed, but only abnormal results are displayed) Labs Reviewed  COMPREHENSIVE METABOLIC  PANEL - Abnormal; Notable for the following components:      Result Value   Glucose, Bld 151 (*)    BUN 33 (*)    Creatinine, Ser 1.05 (*)    Calcium 8.7 (*)    GFR, Estimated 55 (*)    All other components within normal limits  CBC WITH DIFFERENTIAL/PLATELET - Abnormal; Notable for the following components:   WBC 16.4 (*)    Neutro Abs 13.0 (*)    Abs Immature Granulocytes 0.11 (*)    All other components within normal limits  BLOOD GAS, ARTERIAL - Abnormal; Notable for the following components:   pO2, Arterial 116 (*)    Acid-base deficit 4.4 (*)    All other components within normal limits  SARS CORONAVIRUS 2 BY RT PCR  BRAIN NATRIURETIC PEPTIDE  MAGNESIUM  PROCALCITONIN  URINALYSIS, ROUTINE W REFLEX MICROSCOPIC  TROPONIN I (HIGH SENSITIVITY)  TROPONIN I (HIGH SENSITIVITY)    EKG Sinus rhythm with a rate of 91 bpm.  Normal axis and intervals.  No clear signs of acute ischemia.  RADIOLOGY 1  view CXR interpreted by me with pulmonary vascular congestion without lobar filtration or PTX.  Official radiology report(s): DG Chest Portable 1 View  Result Date: 11/16/2021 CLINICAL DATA:  Shortness of breath EXAM: PORTABLE CHEST 1 VIEW COMPARISON:  07/17/2020 FINDINGS: Cardiac shadow is stable. Chronic scarring and thickening of the minor fissure on the right is seen. Increased interstitial changes are noted particularly on the left consistent with edema. No focal infiltrate is noted. IMPRESSION: Increasing interstitial edema when compare with the prior exam. Electronically Signed   By: Inez Catalina M.D.   On: 11/16/2021 02:36    PROCEDURES and INTERVENTIONS:  .1-3 Lead EKG Interpretation  Performed by: Vladimir Crofts, MD Authorized by: Vladimir Crofts, MD     Interpretation: normal     ECG rate:  92   ECG rate assessment: normal     Rhythm: sinus rhythm     Ectopy: none     Conduction: normal   .Critical Care  Performed by: Vladimir Crofts, MD Authorized by: Vladimir Crofts, MD    Critical care provider statement:    Critical care time (minutes):  30   Critical care time was exclusive of:  Separately billable procedures and treating other patients   Critical care was necessary to treat or prevent imminent or life-threatening deterioration of the following conditions:  Respiratory failure   Critical care was time spent personally by me on the following activities:  Development of treatment plan with patient or surrogate, discussions with consultants, evaluation of patient's response to treatment, examination of patient, ordering and review of laboratory studies, ordering and review of radiographic studies, ordering and performing treatments and interventions, pulse oximetry, re-evaluation of patient's condition and review of old charts   Medications  ipratropium-albuterol (DUONEB) 0.5-2.5 (3) MG/3ML nebulizer solution 6 mL (6 mLs Nebulization Given 11/16/21 0221)     IMPRESSION / MDM / [place] / ED COURSE  I reviewed the triage vital signs and the nursing notes.  Differential diagnosis includes, but is not limited to, CHF exacerbation, COPD exacerbation, ACS, PTX, pneumonia, PE  {Patient presents with symptoms of an acute illness or injury that is potentially life-threatening.  76 year old female with chronic respiratory failure presents to the ED with acute hypoxic respiratory failure with evidence of COPD exacerbation, possibly CHF exacerbation, requiring BiPAP and medical admission.  She presents tachypneic and in respiratory distress with very poor airflow on pulmonary auscultation.  Improving fairly quickly with BiPAP administration and DuoNebs.  Work-up is fairly nonrevealing beyond this.  Her blood work does demonstrate a nonspecific leukocytosis after receiving prehospital IV steroids.  SIRS criteria and sepsis is less likely as her procalcitonin is negative, x-ray without lobar infiltration and no symptoms of increased sputum production.  Troponin is low  and EKG is nonischemic.  While her BNP is low, she does look peripherally volume overloaded and does have a history of diastolic dysfunction, with her x-ray results, CHF exacerbation is also a strong possibility.  In setting of her low procalcitonin and clear x-ray, antibiotics were withheld and she was admitted to medicine.  Clinical Course as of 11/16/21 0407  Fri Nov 16, 2021  0213 Reassessed as she's getting set up on bipap. RR 30 [DS]  2800 Reassessed.  Looks much better.  Respiratory rate slowed to the low/mid 20s. [DS]  3491 Consult hospitalist who agrees to admit. [DS]    Clinical Course User Index [DS] Vladimir Crofts, MD     FINAL CLINICAL IMPRESSION(S) / ED DIAGNOSES   Final  diagnoses:  COPD exacerbation ([place])  SOB (shortness of breath)  Hypoxia     Rx / DC Orders   ED Discharge Orders     None        Note:  This document was prepared using Dragon voice recognition software and may include unintentional dictation errors.   Vladimir Crofts, MD 11/16/21 [phone]

## 2021-11-16 NOTE — Assessment & Plan Note (Signed)
Possible mild fluid overload based on chest x-ray though BNP normal Patient with mild peripheral edema but otherwise not overtly fluid overloaded We will give one-time low-dose IV Lasix echocardiogram from 2019 shows preserved EF with grade 1 diastolic dysfunction Repeat echocardiogram Daily weights with intake and output monitoring
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
## 2021-11-16 NOTE — Progress Notes (Signed)
*  PRELIMINARY RESULTS* Echocardiogram 2D Echocardiogram has been performed.  Rachel [name] 11/16/2021, 1:27 PM

## 2021-11-16 NOTE — ED Triage Notes (Signed)
Pt coming from [place] via [facility]. Per EMS, facility states pt became SOB 5-10 PTA. Pt was intially 86% on 5L [place]. Pt normally wears 4L [place] at baseline. EMS gave pt 2 albuterol treatments, 1 duoneb, and 125 mg of Solumedrol IV.   Pt is currently SOB. Pt is alert and oriented. Pt appears diaphoretic.

## 2021-11-16 NOTE — Assessment & Plan Note (Signed)
Acute on chronic respiratory failure with hypoxemia Patient presented with acute respiratory distress satting at 83 to 84% on 5 L arriving on NRB, placed on BiPAP on arrival Scheduled and as needed nebulized bronchodilator treatments IV steroids Continue BiPAP anemia probably related to metabolic cannula.  On continuous home O2 of 3 L

## 2021-11-16 NOTE — TOC CM/SW Note (Signed)
Per chart review, patient admitted from [facility]. Left voicemail for admissions coordinator.  Dayton Scrape, [place]

## 2021-11-16 NOTE — Assessment & Plan Note (Signed)
Continue lovastatin 

## 2021-11-16 NOTE — ED Notes (Signed)
Patient placed on purewick at this time. 

## 2021-11-16 NOTE — Assessment & Plan Note (Addendum)
Creatinine was 1.25 yesterday.  Received a fluid bolus yesterday.  Creatinine 1.19 today., up from baseline of 0.86.

## 2021-11-16 NOTE — Assessment & Plan Note (Signed)
Suspect mostly related to a COPD exacerbation with possibly a mild component of CHF Continue BiPAP and wean as tolerated Treat acute etiologies as outlined

## 2021-11-16 NOTE — Progress Notes (Signed)
Patient admitted early hours of the morning came in from nursing home with increasing shortness of breath and was found to have saturations in the 80s. Patient was placed on BiPAP overnight currently on high flow nasal cannula oxygen 7 L . She is on chronic home oxygen 3 to 4 L for COPD exacerbation. IV Solu-Medrol. Sister at bedside. Patient denies any fever chills or cough. She is also getting some Lasix for possible pulmonary edema. Will resume her home meds.  Discussed with patient's sister at bedside code status discuss. Patient wishes to be DNR.

## 2021-11-16 NOTE — Assessment & Plan Note (Signed)
On lisinopril with hyperkalemia.  On hydralazine.

## 2021-11-16 NOTE — TOC Initial Note (Signed)
Transition of Care [facility]) - Initial/Assessment Note    Patient Details  Name: Rachel [name] MRN: [id number] Date of Birth: [birth year]  Transition of Care [facility]) CM/SW Contact:    Candie Chroman, LCSW Phone Number: 11/16/2021, 3:39 PM  Clinical Narrative:   Per chart review, patient admitted from [facility]. CSW called facility and confirmed she is a long-term resident. Will follow for support and facilitate return once medically stable.               Expected Discharge Plan: Skilled Nursing Facility Barriers to Discharge: Continued Medical Work up   Patient Goals and CMS Choice     Choice offered to / list presented to : NA  Expected Discharge Plan and Services Expected Discharge Plan: [place] Acute Care Choice: Resumption of Svcs/PTA Provider Living arrangements for the past 2 months: [place]                                      Prior Living Arrangements/Services Living arrangements for the past 2 months: [place] Lives with:: Facility Resident Patient language and need for interpreter reviewed:: Yes        Need for Family Participation in Patient Care: Yes (Comment) Care giver support system in place?: Yes (comment)   Criminal Activity/Legal Involvement Pertinent to Current Situation/Hospitalization: No - Comment as needed  Activities of Daily Living Home Assistive Devices/Equipment: Gilford Rile (specify type) ADL Screening (condition at time of admission) Patient's cognitive ability adequate to safely complete daily activities?: Yes Is the patient deaf or have difficulty hearing?: No Does the patient have difficulty seeing, even when wearing glasses/contacts?: No Does the patient have difficulty concentrating, remembering, or making decisions?: No Patient able to express need for assistance with ADLs?: Yes Does the patient have difficulty dressing or bathing?: No Independently performs ADLs?:  Yes (appropriate for developmental age) Does the patient have difficulty walking or climbing stairs?: No Weakness of Legs: None Weakness of Arms/Hands: None  Permission Sought/Granted Permission sought to share information with : Facility [place] administrator granted to share info w AGENCY: [facility]        Emotional Assessment Appearance:: Appears stated age       Alcohol / Substance Use: Not Applicable Psych Involvement: No (comment)  Admission diagnosis:  COPD with acute exacerbation ([place]) [J44.1] Patient Active Problem List   Diagnosis Date Noted   Acute on chronic diastolic CHF (congestive heart failure) ([place]) 11/16/2021   Shingles 07/15/2019   Acute confusion 07/15/2019   AKI (acute kidney injury) ([place]) 07/15/2019   COPD with acute exacerbation ([place]) 07/14/2019   Acute on chronic respiratory failure with hypoxemia ([place]) 09/10/2018   Shortness of breath 12/10/2017   Smoker 12/10/2017   Essential hypertension 12/10/2017   Morbid obesity ([place]) 12/10/2017   Bilateral hip pain 12/10/2017   Dyslipidemia 12/10/2017   Hard of hearing 12/10/2017   Facial abscess 12/24/2015   Open wound of left lower leg 12/29/2013   Leg laceration 12/23/2013   Open wound of knee, leg (except thigh), and ankle, complicated 53/29/9242   Wound of right leg 01/13/2013   PCP:  Albina Billet, MD Pharmacy:   [facility] [address]), [place] [address] [place] [address] Phone: [phone] Fax: [phone]  Social Determinants of Health (SDOH) Interventions    Readmission Risk Interventions     No data to display           

## 2021-11-17 DIAGNOSIS — E785 Hyperlipidemia, unspecified: Secondary | ICD-10-CM

## 2021-11-17 DIAGNOSIS — N179 Acute kidney failure, unspecified: Secondary | ICD-10-CM

## 2021-11-17 DIAGNOSIS — J9621 Acute and chronic respiratory failure with hypoxia: Secondary | ICD-10-CM | POA: Diagnosis not present

## 2021-11-17 DIAGNOSIS — J441 Chronic obstructive pulmonary disease with (acute) exacerbation: Secondary | ICD-10-CM | POA: Diagnosis not present

## 2021-11-17 DIAGNOSIS — I10 Essential (primary) hypertension: Secondary | ICD-10-CM

## 2021-11-17 LAB — PROCALCITONIN: Procalcitonin: 1.06 ng/mL

## 2021-11-17 MED ORDER — ALBUTEROL SULFATE (2.5 MG/3ML) 0.083% IN NEBU
2.5000 mg | INHALATION_SOLUTION | Freq: Four times a day (QID) | RESPIRATORY_TRACT | Status: DC
  Administered 2021-11-17 – 2021-11-19 (×8): 2.5 mg via RESPIRATORY_TRACT
  Filled 2021-11-17 (×8): qty 3

## 2021-11-17 MED ORDER — TRAMADOL HCL 50 MG PO TABS
50.0000 mg | ORAL_TABLET | Freq: Four times a day (QID) | ORAL | Status: AC | PRN
  Administered 2021-11-17 – 2021-11-18 (×2): 50 mg via ORAL
  Filled 2021-11-17 (×2): qty 1

## 2021-11-17 MED ORDER — UMECLIDINIUM-VILANTEROL 62.5-25 MCG/ACT IN AEPB
1.0000 | INHALATION_SPRAY | Freq: Every day | RESPIRATORY_TRACT | Status: DC
  Administered 2021-11-18 – 2021-11-20 (×3): 1 via RESPIRATORY_TRACT
  Filled 2021-11-17 (×2): qty 14

## 2021-11-17 MED ORDER — METHYLPREDNISOLONE SODIUM SUCC 40 MG IJ SOLR
40.0000 mg | Freq: Every day | INTRAMUSCULAR | Status: DC
  Administered 2021-11-17 – 2021-11-20 (×4): 40 mg via INTRAVENOUS
  Filled 2021-11-17 (×4): qty 1

## 2021-11-17 NOTE — Evaluation (Signed)
Physical Therapy Evaluation Patient Details Name: Rachel [name] MRN: [id number] DOB: August 10, 1945 Today's Date: 11/17/2021  History of Present Illness  Pt is a 76 y.o. female with medical history significant for COPD and chronic respiratory failure on home O2 at 3 to 4 L, HTN, HLD, history of cochlear implants who presents to the ED from [place] with shortness of breath, onset of a couple hours prior to arrival.   Clinical Impression  Patient alert, oriented to self and place, [facility] and needed hearing device throughout session (batteries died, PT provided new ones). She stated at baseline she is living in a room at [facility], able to ambulate ~10-80f at baseline no AD to bathroom. dresses independently (does not wear socks/shoes). sleeps in a recliner .  Pt denied pain, and with time able to transfer to EOB on 5L. SpO2 at 82% and did not recover, placed on 6L for mobility and left on 6L at end of session (88-91%), RN notified of patient status. She was able to stand with CGA no AD, and take several steps to recliner with CGA as well. Pt very SOB with all activities and needed rest breaks and cues for PLB.  Overall the patient demonstrated deficits (see "PT Problem List") that impede the patient's functional abilities, safety, and mobility and would benefit from skilled PT intervention. Recommendation at this time is HHPT at her LTC facility to maximize function and safety.       Recommendations for follow up therapy are one component of a multi-disciplinary discharge planning process, led by the attending physician.  Recommendations may be updated based on patient status, additional functional criteria and insurance authorization.  Follow Up Recommendations Home health PT      Assistance Recommended at Discharge Intermittent Supervision/Assistance  Patient can return home with the following  A little help with bathing/dressing/bathroom;Assistance with cooking/housework;Assist for  transportation;Help with stairs or ramp for entrance;Direct supervision/assist for medications management    Equipment Recommendations None recommended by PT  Recommendations for Other Services       Functional Status Assessment Patient has had a recent decline in their functional status and demonstrates the ability to make significant improvements in function in a reasonable and predictable amount of time.     Precautions / Restrictions Precautions Precautions: Fall Restrictions Weight Bearing Restrictions: No      Mobility  Bed Mobility Overal bed mobility: Needs Assistance Bed Mobility: Supine to Sit     Supine to sit: Supervision, HOB elevated     General bed mobility comments: use of bed rails    Transfers Overall transfer level: Needs assistance Equipment used: None Transfers: Sit to/from Stand Sit to Stand: Supervision                Ambulation/Gait Ambulation/Gait assistance: Min guard Gait Distance (Feet): 2 Feet Assistive device: None         General Gait Details: takes several steps to recliner, CGA  Stairs            Wheelchair Mobility    Modified Rankin (Stroke Patients Only)       Balance Overall balance assessment: Needs assistance Sitting-balance support: Feet supported Sitting balance-Leahy Scale: Good       Standing balance-Leahy Scale: Fair                               Pertinent Vitals/Pain Pain Assessment Pain Assessment: No/denies pain    Home Living Family/patient  expects to be discharged to:: Other (Comment) (LTC)                        Prior Function               Mobility Comments: pt reported living in a room at [facility], able to ambulate ~10-54f at baseline no AD to bathroom. dresses independently (does not wear socks/shoes). sleeps in a recliner       Hand Dominance        Extremity/Trunk Assessment        Lower Extremity Assessment Lower Extremity Assessment:  Generalized weakness    Cervical / Trunk Assessment Cervical / Trunk Assessment: Normal  Communication   Communication: HOH  Cognition Arousal/Alertness: Awake/alert Behavior During Therapy: WFL for tasks assessed/performed Overall Cognitive Status: Within Functional Limits for tasks assessed                                          General Comments      Exercises     Assessment/Plan    PT Assessment Patient needs continued PT services  PT Problem List Decreased activity tolerance;Decreased balance;Cardiopulmonary status limiting activity;Decreased mobility       PT Treatment Interventions DME instruction;Therapeutic exercise;Gait training;Balance training;Stair training;Neuromuscular re-education;Functional mobility training;Therapeutic activities;Patient/family education    PT Goals (Current goals can be found in the Care Plan section)  Acute Rehab PT Goals Patient Stated Goal: to feel better PT Goal Formulation: With patient Time For Goal Achievement: 12/01/21 Potential to Achieve Goals: Good    Frequency Min 2X/week     Co-evaluation               AM-PAC PT "6 Clicks" Mobility  Outcome Measure Help needed turning from your back to your side while in a flat bed without using bedrails?: A Little Help needed moving from lying on your back to sitting on the side of a flat bed without using bedrails?: None Help needed moving to and from a bed to a chair (including a wheelchair)?: None Help needed standing up from a chair using your arms (e.g., wheelchair or bedside chair)?: A Little Help needed to walk in hospital room?: A Little Help needed climbing 3-5 steps with a railing? : A Lot 6 Click Score: 19    End of Session   Activity Tolerance: Patient tolerated treatment well Patient left: in chair;with call bell/phone within reach;with chair alarm set Nurse Communication: Mobility status PT Visit Diagnosis: Other abnormalities of gait and  mobility (R26.89);Difficulty in walking, not elsewhere classified (R26.2);Muscle weakness (generalized) (M62.81)    Time: [address]PT Time Calculation (min) (ACUTE ONLY): 24 min   Charges:   PT Evaluation $PT Eval Low Complexity: 1 Low PT Treatments $Therapeutic Activity: 8-22 mins        DLieutenant DiegoPT, DPT 10:47 AM,11/17/21

## 2021-11-17 NOTE — TOC Progression Note (Addendum)
Transition of Care [facility]) - Progression Note    Patient Details  Name: Rachel [name] MRN: [id number] Date of Birth: October 17, 1945  Transition of Care [facility]) CM/SW [place], LCSW Phone Number: 11/17/2021, 10:40 AM  Clinical Narrative:    CSW was informed that patient will need PT when she returns to her long term care facility ([facility]). CSW notified Ricky with [facility]. Sent FL2. TOC will need to start auth when patient is closer to being medically ready, asked Care Team to keep [facility] updated on when auth needs to be started. Patient was on HFNC this morning.  TOC handoff updated.   3:33- Auth started in [place] portal.    Expected Discharge Plan: [place] Barriers to Discharge: Continued Medical Work up  Expected Discharge Plan and Services Expected Discharge Plan: [place] Acute Care Choice: Resumption of Svcs/PTA Provider Living arrangements for the past 2 months: Skilled Nursing Facility                                       Social Determinants of Health (SDOH) Interventions    Readmission Risk Interventions     No data to display

## 2021-11-17 NOTE — NC FL2 (Signed)
[place] LEVEL OF CARE SCREENING TOOL     IDENTIFICATION  Patient Name: Rachel [name] Birthdate: Jun 15, 1945 Sex: female Admission Date (Current Location): 11/16/2021  [facility] and [place] Number:  Engineering geologist and Address:  [facility], [address] [place], [place] [address]      Provider Number: [id number]  Attending Physician Name and Address:  Loletha Grayer, MD  Relative Name and Phone Number:  Jimmey Ralph (Sister)   [phone] [facility])    Current Level of Care: Hospital Recommended Level of Care: [place] Prior Approval Number:    Date Approved/Denied:   PASRR Number: [id number]  Discharge Plan:      Current Diagnoses: Patient Active Problem List   Diagnosis Date Noted   Acute on chronic diastolic CHF (congestive heart failure) ([place]) 11/16/2021   Shingles 07/15/2019   Acute confusion 07/15/2019   AKI (acute kidney injury) ([place]) 07/15/2019   COPD with acute exacerbation ([place]) 07/14/2019   Acute on chronic respiratory failure with hypoxemia ([place]) 09/10/2018   Shortness of breath 12/10/2017   Smoker 12/10/2017   Essential hypertension 12/10/2017   Morbid obesity ([place]) 12/10/2017   Bilateral hip pain 12/10/2017   Dyslipidemia 12/10/2017   Hard of hearing 12/10/2017   Facial abscess 12/24/2015   Open wound of left lower leg 12/29/2013   Leg laceration 12/23/2013   Open wound of knee, leg (except thigh), and ankle, complicated 19/62/2297   Wound of right leg 01/13/2013    Orientation RESPIRATION BLADDER Height & Weight     Self, Time, Situation, Place  O2 Continent Weight: 182 lb 15.7 oz (83 kg) Height:  '5\' 6"'$  (167.6 cm)  BEHAVIORAL SYMPTOMS/MOOD NEUROLOGICAL BOWEL NUTRITION STATUS      Continent Diet (heart)  AMBULATORY STATUS COMMUNICATION OF NEEDS Skin     Verbally Normal                       Personal Care Assistance Level of Assistance  Bathing, Dressing,  Feeding Bathing Assistance: Limited assistance Feeding assistance: Limited assistance Dressing Assistance: Limited assistance     Functional Limitations Info             SPECIAL CARE FACTORS FREQUENCY  PT (By licensed PT), OT (By licensed OT)     PT Frequency: 5 times per week OT Frequency: 5 times per week            Contractures      Additional Factors Info  Code Status, Allergies Code Status Info: DNR Allergies Info: NKA           Current Medications (11/17/2021):  This is the current hospital active medication list Current Facility-Administered Medications  Medication Dose Route Frequency Provider Last Rate Last Admin   acetaminophen (TYLENOL) tablet 650 mg  650 mg Oral Q6H PRN Athena Masse, MD   650 mg at 11/17/21 0820   Or   acetaminophen (TYLENOL) suppository 650 mg  650 mg Rectal Q6H PRN Athena Masse, MD       albuterol (PROVENTIL) (2.5 MG/3ML) 0.083% nebulizer solution 2.5 mg  2.5 mg Nebulization Q2H PRN Athena Masse, MD       diltiazem (CARDIZEM) tablet 120 mg  120 mg Oral TID Fritzi Mandes, MD   120 mg at 11/16/21 2141   enoxaparin (LOVENOX) injection 40 mg  40 mg Subcutaneous Q24H Athena Masse, MD   40 mg at 11/17/21 0820   FLUoxetine (PROZAC) capsule 10 mg  10 mg Oral Daily Fritzi Mandes, MD   10 mg at 11/16/21 1504   gabapentin (NEURONTIN) capsule 100 mg  100 mg Oral BID Fritzi Mandes, MD   100 mg at 11/16/21 2141   guaiFENesin (MUCINEX) 12 hr tablet 600 mg  600 mg Oral BID Athena Masse, MD   600 mg at 11/16/21 2141   hydrALAZINE (APRESOLINE) tablet 25 mg  25 mg Oral TID Fritzi Mandes, MD   25 mg at 11/16/21 2141   ipratropium-albuterol (DUONEB) 0.5-2.5 (3) MG/3ML nebulizer solution 3 mL  3 mL Nebulization Q6H Judd Gaudier V, MD   3 mL at 11/17/21 0813   lisinopril (ZESTRIL) tablet 20 mg  20 mg Oral Daily Judd Gaudier V, MD   20 mg at 11/16/21 1016   memantine (NAMENDA) tablet 10 mg  10 mg Oral BID Fritzi Mandes, MD   10 mg at 11/16/21 2141    ondansetron (ZOFRAN) tablet 4 mg  4 mg Oral Q6H PRN Athena Masse, MD       Or   ondansetron [facility]) injection 4 mg  4 mg Intravenous Q6H PRN Athena Masse, MD       pantoprazole (PROTONIX) EC tablet 40 mg  40 mg Oral Daily Fritzi Mandes, MD   40 mg at 11/16/21 1504   pravastatin (PRAVACHOL) tablet 10 mg  10 mg Oral q1800 Athena Masse, MD   10 mg at 11/16/21 1638   predniSONE (DELTASONE) tablet 40 mg  40 mg Oral Q breakfast Athena Masse, MD   40 mg at 11/17/21 0820   senna (SENOKOT) tablet 8.6 mg  1 tablet Oral q AM Fritzi Mandes, MD   8.6 mg at 11/17/21 0820     Discharge Medications: Please see discharge summary for a list of discharge medications.  Relevant Imaging Results:  Relevant Lab Results:   Additional Information SS #: [id number]  [place], LCSW

## 2021-11-17 NOTE — Progress Notes (Addendum)
  Progress Note   Patient: Rachel [name] ZOX:[id number] DOB: 1945-09-19 DOA: 11/16/2021     1 DOS: the patient was seen and examined on 11/17/2021     Assessment and Plan: * COPD with acute exacerbation (HCC) Acute on chronic respiratory failure with hypoxemia Patient presented with acute respiratory distress satting at 83 to 84% on 5 L arriving on NRB, placed on BiPAP on arrival Patient was on 6 L high flow nasal cannula this morning and looks like this afternoon down to 4 L.  Baseline on 3 to 4 L at home. Switch prednisone back to Solu-Medrol  Acute on chronic respiratory failure with hypoxemia (HCC) Secondary to COPD exacerbation.  See above.   Acute on chronic diastolic CHF (congestive heart failure) (HCC) I think CHF is ruled out with a normal BNP and normal EF.  AKI (acute kidney injury) (HCC) Creatinine 1.05, up from baseline of 0.86.  Check creatinine tomorrow.  Dyslipidemia Continue lovastatin  Essential hypertension Continue home lisinopril        Subjective: Patient came in with shortness of breath initially requiring BiPAP.  Was started on Solu-Medrol.  Physical Exam: Vitals:   11/17/21 0400 11/17/21 0500 11/17/21 0736 11/17/21 1142  BP: (!) 159/46  (!) 151/70 (!) 145/54  Pulse: (!) 104  82 68  Resp: 20  19 18   Temp: 98.4 F (36.9 C)  97.7 F (36.5 C) 97.9 F (36.6 C)  TempSrc: Oral  Oral Oral  SpO2: 96%  96% 95%  Weight:  83 kg    Height:       Physical Exam HENT:     Head: Normocephalic.     Mouth/Throat:     Pharynx: No oropharyngeal exudate.  Eyes:     General: Lids are normal.     Conjunctiva/sclera: Conjunctivae normal.  Cardiovascular:     Rate and Rhythm: Normal rate and regular rhythm.     Heart sounds: Normal heart sounds, S1 normal and S2 normal.  Pulmonary:     Breath sounds: Transmitted upper airway sounds present. Examination of the right-lower field reveals decreased breath sounds and wheezing. Examination of the left-lower  field reveals decreased breath sounds and wheezing. Decreased breath sounds and wheezing present. No rhonchi or rales.  Abdominal:     Palpations: Abdomen is soft.     Tenderness: There is no abdominal tenderness.  Musculoskeletal:     Right lower leg: Swelling present.     Left lower leg: Swelling present.  Skin:    General: Skin is warm.     Findings: No rash.  Neurological:     Mental Status: She is alert and oriented to person, place, and time.     Data Reviewed: BNP low at 40, creatinine 1.05, white blood cell count 16.4  Family Communication: Updated sister on the phone  Disposition: Status is: Inpatient Remains inpatient appropriate because: Will need insurance authorization to have PT back at her long-term care facility.  Was on 6 L high flow nasal cannula this morning Planned Discharge Destination: Back to our facility    Time spent: 35 minutes  Author: Alford Highland, MD 11/17/2021 3:38 PM  For on call review [email or web address].

## 2021-11-18 DIAGNOSIS — N179 Acute kidney failure, unspecified: Secondary | ICD-10-CM | POA: Diagnosis not present

## 2021-11-18 DIAGNOSIS — E875 Hyperkalemia: Secondary | ICD-10-CM | POA: Diagnosis not present

## 2021-11-18 DIAGNOSIS — J9621 Acute and chronic respiratory failure with hypoxia: Secondary | ICD-10-CM | POA: Diagnosis not present

## 2021-11-18 DIAGNOSIS — J441 Chronic obstructive pulmonary disease with (acute) exacerbation: Secondary | ICD-10-CM | POA: Diagnosis not present

## 2021-11-18 LAB — BASIC METABOLIC PANEL
Anion gap: 5 (ref 5–15)
BUN: 51 mg/dL — ABNORMAL HIGH (ref 8–23)
CO2: 22 mmol/L (ref 22–32)
Calcium: 9.3 mg/dL (ref 8.9–10.3)
Chloride: 111 mmol/L (ref 98–111)
Creatinine, Ser: 1.25 mg/dL — ABNORMAL HIGH (ref 0.44–1.00)
GFR, Estimated: 45 mL/min — ABNORMAL LOW (ref >60)
Glucose, Bld: 144 mg/dL — ABNORMAL HIGH (ref 70–99)
Potassium: 5.4 mmol/L — ABNORMAL HIGH (ref 3.5–5.1)
Sodium: 138 mmol/L (ref 135–145)

## 2021-11-18 LAB — CBC
HCT: 35.2 % — ABNORMAL LOW (ref 36.0–46.0)
Hemoglobin: 11.1 g/dL — ABNORMAL LOW (ref 12.0–15.0)
MCH: 30.7 pg (ref 26.0–34.0)
MCHC: 31.5 g/dL (ref 30.0–36.0)
MCV: 97.2 fL (ref 80.0–100.0)
Platelets: 222 10*3/uL (ref 150–400)
RBC: 3.62 MIL/uL — ABNORMAL LOW (ref 3.87–5.11)
RDW: 13.4 % (ref 11.5–15.5)
WBC: 12.8 10*3/uL — ABNORMAL HIGH (ref 4.0–10.5)
nRBC: 0 % (ref 0.0–0.2)

## 2021-11-18 LAB — PROCALCITONIN: Procalcitonin: 0.93 ng/mL

## 2021-11-18 MED ORDER — MELATONIN 5 MG PO TABS
5.0000 mg | ORAL_TABLET | Freq: Once | ORAL | Status: AC
Start: 2021-11-18 — End: 2021-11-18
  Administered 2021-11-18: 5 mg via ORAL
  Filled 2021-11-18: qty 1

## 2021-11-18 MED ORDER — DILTIAZEM HCL 30 MG PO TABS
120.0000 mg | ORAL_TABLET | Freq: Two times a day (BID) | ORAL | Status: DC
  Administered 2021-11-18 – 2021-11-19 (×2): 120 mg via ORAL
  Filled 2021-11-18 (×2): qty 4

## 2021-11-18 MED ORDER — SODIUM ZIRCONIUM CYCLOSILICATE 10 G PO PACK
10.0000 g | PACK | Freq: Every day | ORAL | Status: DC
  Administered 2021-11-18: 10 g via ORAL
  Filled 2021-11-18 (×2): qty 1

## 2021-11-18 MED ORDER — SODIUM CHLORIDE 0.9 % IV BOLUS
250.0000 mL | Freq: Once | INTRAVENOUS | Status: AC
  Administered 2021-11-18: 250 mL via INTRAVENOUS

## 2021-11-18 NOTE — Assessment & Plan Note (Signed)
We will give a dose of Lokelma.  Recheck BMP tomorrow morning.  Fluid bolus today.

## 2021-11-18 NOTE — Progress Notes (Signed)
Provider notified of Pt's Low DBP, B/P 129/44 HR is 65. See med changes on MAR.

## 2021-11-18 NOTE — TOC Progression Note (Addendum)
Transition of Care [facility]) - Progression Note    Patient Details  Name: RENAYE JANICKI MRN: [id number] Date of Birth: 13-Jan-1946  Transition [facility] Care [facility]) CM/SW [place], LCSW Phone Number: 11/18/2021, 9:02 AM  Clinical Narrative:   Josem Kaufmann pending in Navi portal.  12:37- Auth still pending.    Expected Discharge Plan: [place] Barriers to Discharge: Continued Medical Work up  Expected Discharge Plan and Services Expected Discharge Plan: [place] Acute Care Choice: Resumption of Svcs/PTA Provider Living arrangements for the past 2 months: Skilled Nursing Facility                                       Social Determinants of Health (SDOH) Interventions    Readmission Risk Interventions     No data to display

## 2021-11-18 NOTE — Progress Notes (Signed)
  Progress Note   Patient: Rachel [name] PPJ:[id number] DOB: 1945/09/02 DOA: 11/16/2021     2 DOS: the patient was seen and examined on 11/18/2021     Assessment and Plan: * COPD with acute exacerbation ([place]) Acute on chronic respiratory failure with hypoxemia Patient presented with acute respiratory distress satting at 83 to 84% on 5 L arriving on NRB, placed on BiPAP on arrival Patient back to baseline 4 L oxygen.  Continue IV Solu-Medrol today with upper airway wheezing.  Acute on chronic respiratory failure with hypoxemia (HCC) Secondary to COPD exacerbation.  See above.   Acute on chronic diastolic CHF (congestive heart failure) ([place]) I think CHF is ruled out with a normal BNP and normal EF.  AKI (acute kidney injury) ([place]) Creatinine 1.25 today, up from baseline of 0.86.  Check creatinine tomorrow.  We will give fluid bolus.  Hyperkalemia We will give a dose of Lokelma.  Recheck BMP tomorrow morning.  Fluid bolus today.  Essential hypertension On lisinopril with hyperkalemia.  On hydralazine.  Dyslipidemia Continue lovastatin        Subjective: Patient still feels a little short of breath.  Still having some wheezing.  Not much cough.  Admitted with COPD exacerbation.  Physical Exam: Vitals:   11/17/21 2032 11/18/21 0513 11/18/21 0737 11/18/21 1108  BP:  132/60 (!) 133/58 (!) 132/48  Pulse:  (!) 59 65 87  Resp:   20 20  Temp:  98 F (36.7 C) 97.8 F (36.6 C) 98.1 F (36.7 C)  TempSrc:  Oral Oral   SpO2: 97%  97% 92%  Weight:  120 kg    Height:       Physical Exam HENT:     Head: Normocephalic.     Mouth/Throat:     Pharynx: No oropharyngeal exudate.  Eyes:     General: Lids are normal.     Conjunctiva/sclera: Conjunctivae normal.  Cardiovascular:     Rate and Rhythm: Normal rate and regular rhythm.     Heart sounds: Normal heart sounds, S1 normal and S2 normal.  Pulmonary:     Breath sounds: Transmitted upper airway sounds present. Examination  of the right-lower field reveals decreased breath sounds and wheezing. Examination of the left-lower field reveals decreased breath sounds and wheezing. Decreased breath sounds and wheezing present. No rhonchi or rales.  Abdominal:     Palpations: Abdomen is soft.     Tenderness: There is no abdominal tenderness.  Musculoskeletal:     Right lower leg: Swelling present.     Left lower leg: Swelling present.  Skin:    General: Skin is warm.     Findings: No rash.  Neurological:     Mental Status: She is alert and oriented to person, place, and time.     Data Reviewed: Creatinine 1.25, BUN 51, potassium 5.4  Family Communication: updated sister on phone  Disposition: Status is: Inpatient Remains inpatient appropriate because: Still having some wheeze.  Requires IV Solu-Medrol  Planned Discharge Destination: Back to her facility    Time spent: 28 minutes  Author: Loletha Grayer, MD 11/18/2021 2:25 PM  For on call review [email or web address].

## 2021-11-19 DIAGNOSIS — E875 Hyperkalemia: Secondary | ICD-10-CM | POA: Diagnosis not present

## 2021-11-19 DIAGNOSIS — J441 Chronic obstructive pulmonary disease with (acute) exacerbation: Secondary | ICD-10-CM | POA: Diagnosis not present

## 2021-11-19 DIAGNOSIS — N179 Acute kidney failure, unspecified: Secondary | ICD-10-CM | POA: Diagnosis not present

## 2021-11-19 DIAGNOSIS — J9621 Acute and chronic respiratory failure with hypoxia: Secondary | ICD-10-CM | POA: Diagnosis not present

## 2021-11-19 LAB — POTASSIUM: Potassium: 5.2 mmol/L — ABNORMAL HIGH (ref 3.5–5.1)

## 2021-11-19 LAB — BASIC METABOLIC PANEL
Anion gap: 4 — ABNORMAL LOW (ref 5–15)
BUN: 59 mg/dL — ABNORMAL HIGH (ref 8–23)
CO2: 23 mmol/L (ref 22–32)
Calcium: 9.4 mg/dL (ref 8.9–10.3)
Chloride: 110 mmol/L (ref 98–111)
Creatinine, Ser: 1.19 mg/dL — ABNORMAL HIGH (ref 0.44–1.00)
GFR, Estimated: 47 mL/min — ABNORMAL LOW (ref >60)
Glucose, Bld: 157 mg/dL — ABNORMAL HIGH (ref 70–99)
Potassium: 5.4 mmol/L — ABNORMAL HIGH (ref 3.5–5.1)
Sodium: 137 mmol/L (ref 135–145)

## 2021-11-19 MED ORDER — SODIUM ZIRCONIUM CYCLOSILICATE 10 G PO PACK
10.0000 g | PACK | Freq: Two times a day (BID) | ORAL | Status: DC
  Administered 2021-11-19: 10 g via ORAL
  Filled 2021-11-19 (×2): qty 1

## 2021-11-19 MED ORDER — MELATONIN 5 MG PO TABS
5.0000 mg | ORAL_TABLET | Freq: Once | ORAL | Status: AC
  Administered 2021-11-19: 5 mg via ORAL
  Filled 2021-11-19: qty 1

## 2021-11-19 MED ORDER — DILTIAZEM HCL 30 MG PO TABS
90.0000 mg | ORAL_TABLET | Freq: Two times a day (BID) | ORAL | Status: DC
  Administered 2021-11-19 – 2021-11-20 (×3): 90 mg via ORAL
  Filled 2021-11-19 (×3): qty 3

## 2021-11-19 MED ORDER — IBUPROFEN 400 MG PO TABS
400.0000 mg | ORAL_TABLET | Freq: Once | ORAL | Status: AC
  Administered 2021-11-19: 400 mg via ORAL
  Filled 2021-11-19: qty 1

## 2021-11-19 MED ORDER — KETOROLAC TROMETHAMINE 15 MG/ML IJ SOLN
15.0000 mg | Freq: Once | INTRAMUSCULAR | Status: AC
  Administered 2021-11-19: 15 mg via INTRAVENOUS
  Filled 2021-11-19: qty 1

## 2021-11-19 MED ORDER — SODIUM ZIRCONIUM CYCLOSILICATE 10 G PO PACK
10.0000 g | PACK | Freq: Two times a day (BID) | ORAL | Status: DC
  Filled 2021-11-19: qty 1

## 2021-11-19 MED ORDER — SODIUM ZIRCONIUM CYCLOSILICATE 10 G PO PACK
10.0000 g | PACK | Freq: Two times a day (BID) | ORAL | Status: DC
  Administered 2021-11-19: 10 g via ORAL
  Filled 2021-11-19: qty 1

## 2021-11-19 MED ORDER — MAGNESIUM SULFATE 2 GM/50ML IV SOLN
2.0000 g | Freq: Once | INTRAVENOUS | Status: AC
  Administered 2021-11-19: 2 g via INTRAVENOUS
  Filled 2021-11-19: qty 50

## 2021-11-19 NOTE — Care Management Important Message (Signed)
Important Message  Patient Details  Name: Rachel [name] MRN: [id number] Date of Birth: [birth year]   Medicare Important Message Given:  Yes     Dannette Barbara 11/19/2021, 11:55 AM

## 2021-11-19 NOTE — Progress Notes (Signed)
       CROSS COVER NOTE  NAME: Rachel [name] MRN: [id number] DOB : [birth year]    Date of Service   11/19/21  HPI/Events of Note   Secure chat received from nursing "Patient is requesting her home dose of ambien. She also is complaining of 9/10 headache pain in her forehead. She has tylenol, but already got this at 1722. She is requesting something for this as well."  Interventions   Plan: Ibuprofen 400 mg x1 Melatonin        This document was prepared using Dragon voice recognition software and may include unintentional dictation errors.  Neomia Glass DNP, MHA, FNP-BC Nurse Practitioner [facility] [address] Pager [phone]

## 2021-11-19 NOTE — Progress Notes (Signed)
Physical Therapy Treatment Patient Details Name: Rachel [name] MRN: [id number] DOB: 07-14-1945 Today's Date: 11/19/2021   History of Present Illness Pt is a 76 y.o. female with medical history significant for COPD and chronic respiratory failure on home O2 at 3 to 4 L, HTN, HLD, history of cochlear implants who presents to the ED from [place] with shortness of breath, onset of a couple hours prior to arrival.    PT Comments    Patient alert, agreeable to mobility with encouragement and education. The patient was on 4L throughout mobility, noted for desaturation to 86% after transferring to recliner but with rest break 90%. Supine to sit with supervision, and she was able to ambulate a few feet in the room to the recliner with CGA. Up in chair with all needs in reach. Recommendation remains appropriate to maximize mobility and activity tolerance/endurance.      Recommendations for follow up therapy are one component of a multi-disciplinary discharge planning process, led by the attending physician.  Recommendations may be updated based on patient status, additional functional criteria and insurance authorization.  Follow Up Recommendations  Home health PT     Assistance Recommended at Discharge Intermittent Supervision/Assistance  Patient can return home with the following A little help with bathing/dressing/bathroom;Assistance with cooking/housework;Assist for transportation;Help with stairs or ramp for entrance;Direct supervision/assist for medications management   Equipment Recommendations  None recommended by PT    Recommendations for Other Services       Precautions / Restrictions Precautions Precautions: Fall Restrictions Weight Bearing Restrictions: No     Mobility  Bed Mobility Overal bed mobility: Needs Assistance Bed Mobility: Supine to Sit     Supine to sit: Supervision, HOB elevated     General bed mobility comments: use of bed rails     Transfers Overall transfer level: Needs assistance Equipment used: None Transfers: Sit to/from Stand Sit to Stand: Supervision                Ambulation/Gait Ambulation/Gait assistance: Min guard Gait Distance (Feet): 3 Feet Assistive device: None         General Gait Details: takes several steps to recliner, CGA   Stairs             Wheelchair Mobility    Modified Rankin (Stroke Patients Only)       Balance Overall balance assessment: Needs assistance Sitting-balance support: Feet supported Sitting balance-Leahy Scale: Good       Standing balance-Leahy Scale: Fair                              Cognition Arousal/Alertness: Awake/alert Behavior During Therapy: WFL for tasks assessed/performed Overall Cognitive Status: Within Functional Limits for tasks assessed                                          Exercises      General Comments        Pertinent Vitals/Pain Pain Assessment Pain Assessment: No/denies pain    Home Living                          Prior Function            PT Goals (current goals can now be found in the care plan section) Progress towards PT goals: Progressing  toward goals    Frequency    Min 2X/week      PT Plan Current plan remains appropriate    Co-evaluation              AM-PAC PT "6 Clicks" Mobility   Outcome Measure  Help needed turning from your back to your side while in a flat bed without using bedrails?: A Little Help needed moving from lying on your back to sitting on the side of a flat bed without using bedrails?: None Help needed moving to and from a bed to a chair (including a wheelchair)?: None Help needed standing up from a chair using your arms (e.g., wheelchair or bedside chair)?: A Little Help needed to walk in hospital room?: A Little Help needed climbing 3-5 steps with a railing? : A Lot 6 Click Score: 19    End of Session Equipment  Utilized During Treatment: Gait belt Activity Tolerance: Patient tolerated treatment well Patient left: in chair;with call bell/phone within reach;with chair alarm set Nurse Communication: Mobility status PT Visit Diagnosis: Other abnormalities of gait and mobility (R26.89);Difficulty in walking, not elsewhere classified (R26.2);Muscle weakness (generalized) (M62.81)     Time: 6384-6659 PT Time Calculation (min) (ACUTE ONLY): 14 min  Charges:  $Therapeutic Activity: 8-22 mins                     Lieutenant Diego PT, DPT 12:02 PM,11/19/21

## 2021-11-19 NOTE — Progress Notes (Signed)
  Progress Note   Patient: Rachel [name] XIP:[id number] DOB: [birth year] DOA: 11/16/2021     3 DOS: the patient was seen and examined on 11/19/2021     Assessment and Plan: * Hyperkalemia Treated with Lokelma this morning and repeat potassium this afternoon still high will give another dose of Lokelma and recheck tomorrow.  Holding lisinopril.  Not on any potassium replacement.  COPD with acute exacerbation ([place]) Acute on chronic respiratory failure with hypoxemia Patient presented with acute respiratory distress satting at 83 to 84% on 5 L arriving on NRB, placed on BiPAP on arrival Patient back to baseline 4 L oxygen.  Continue IV Solu-Medrol today.  Reassess tomorrow.  Breathing seems back to baseline.  Acute on chronic respiratory failure with hypoxemia (HCC) Secondary to COPD exacerbation.  See above.   Acute on chronic diastolic CHF (congestive heart failure) ([place]) I think CHF is ruled out with a normal BNP and normal EF.  AKI (acute kidney injury) ([place]) Creatinine was 1.25 yesterday.  Received a fluid bolus yesterday.  Creatinine 1.19 today., up from baseline of 0.86.    Essential hypertension Holding lisinopril with hyperkalemia.  Holding hydralazine.  Continue lower dose of diltiazem.  Dyslipidemia Continue lovastatin        Subjective: Patient feeling a little bit better and breathing little bit better.  Today's potassium still elevated.  Admitted with COPD exacerbation.  Physical Exam: Vitals:   11/19/21 0757 11/19/21 1105 11/19/21 1150 11/19/21 1534  BP: 135/61  (!) 122/36 (!) 148/56  Pulse: 63  60 65  Resp: '20  20 20  '$ Temp: 98 F (36.7 C)  97.9 F (36.6 C) 98.1 F (36.7 C)  TempSrc: Oral  Oral Oral  SpO2: 96% 94% 93% 95%  Weight:      Height:       Physical Exam HENT:     Head: Normocephalic.     Mouth/Throat:     Pharynx: No oropharyngeal exudate.  Eyes:     General: Lids are normal.     Conjunctiva/sclera: Conjunctivae normal.   Cardiovascular:     Rate and Rhythm: Normal rate and regular rhythm.     Heart sounds: Normal heart sounds, S1 normal and S2 normal.  Pulmonary:     Breath sounds: No transmitted upper airway sounds. Examination of the right-lower field reveals decreased breath sounds. Examination of the left-lower field reveals decreased breath sounds. Decreased breath sounds present. No wheezing, rhonchi or rales.  Abdominal:     Palpations: Abdomen is soft.     Tenderness: There is no abdominal tenderness.  Musculoskeletal:     Right lower leg: Swelling present.     Left lower leg: Swelling present.  Skin:    General: Skin is warm.     Findings: No rash.  Neurological:     Mental Status: She is alert and oriented to person, place, and time.     Data Reviewed: Potassium 5.4 this morning and 5.2 this afternoon.  Creatinine 1.19   Disposition: Status is: Inpatient Remains inpatient appropriate because: Patient has hyperkalemia today Planned Discharge Destination: Long-term care with physical therapy   Author: Loletha Grayer, MD 11/19/2021 6:02 PM  For on call review [email or web address].

## 2021-11-19 NOTE — TOC Progression Note (Addendum)
Transition of Care [facility]) - Progression Note    Patient Details  Name: Rachel [name] MRN: [id number] Date of Birth: [birth year]  Transition of Care [facility]) CM/SW [place], LCSW Phone Number: 11/19/2021, 10:48 AM  Clinical Narrative:   Josem Kaufmann still pending.  1:17 pm: Forensic scientist review. Sent details to MD in a secure chat. Deadline 2:30 pm.  1:46 pm: Auth denied. Insurance recommends return to LTC with intermittent therapy (3 x a week). Left voicemail for [facility] admissions coordinator.  Expected Discharge Plan: [place] Barriers to Discharge: Continued Medical Work up  Expected Discharge Plan and Services Expected Discharge Plan: [place] Acute Care Choice: Resumption of Svcs/PTA Provider Living arrangements for the past 2 months: Skilled Nursing Facility                                       Social Determinants of Health (SDOH) Interventions    Readmission Risk Interventions     No data to display

## 2021-11-19 NOTE — Plan of Care (Signed)
  Problem: Education: Goal: Ability to demonstrate management of disease process will improve Outcome: Progressing Goal: Ability to verbalize understanding of medication therapies will improve Outcome: Progressing Goal: Individualized Educational Video(s) Outcome: Progressing   Problem: Activity: Goal: Capacity to carry out activities will improve Outcome: Progressing   Problem: Cardiac: Goal: Ability to achieve and maintain adequate cardiopulmonary perfusion will improve Outcome: Progressing   Problem: Education: Goal: Knowledge of disease or condition will improve Outcome: Progressing Goal: Knowledge of the prescribed therapeutic regimen will improve Outcome: Progressing Goal: Individualized Educational Video(s) Outcome: Progressing   

## 2021-11-20 DIAGNOSIS — I5033 Acute on chronic diastolic (congestive) heart failure: Secondary | ICD-10-CM

## 2021-11-20 DIAGNOSIS — J9621 Acute and chronic respiratory failure with hypoxia: Secondary | ICD-10-CM | POA: Diagnosis not present

## 2021-11-20 DIAGNOSIS — E875 Hyperkalemia: Secondary | ICD-10-CM | POA: Diagnosis not present

## 2021-11-20 DIAGNOSIS — J441 Chronic obstructive pulmonary disease with (acute) exacerbation: Secondary | ICD-10-CM | POA: Diagnosis not present

## 2021-11-20 LAB — BASIC METABOLIC PANEL
Anion gap: 5 (ref 5–15)
BUN: 54 mg/dL — ABNORMAL HIGH (ref 8–23)
CO2: 24 mmol/L (ref 22–32)
Calcium: 9.1 mg/dL (ref 8.9–10.3)
Chloride: 108 mmol/L (ref 98–111)
Creatinine, Ser: 1.07 mg/dL — ABNORMAL HIGH (ref 0.44–1.00)
GFR, Estimated: 54 mL/min — ABNORMAL LOW (ref >60)
Glucose, Bld: 147 mg/dL — ABNORMAL HIGH (ref 70–99)
Potassium: 5.2 mmol/L — ABNORMAL HIGH (ref 3.5–5.1)
Sodium: 137 mmol/L (ref 135–145)

## 2021-11-20 LAB — POTASSIUM: Potassium: 5.1 mmol/L (ref 3.5–5.1)

## 2021-11-20 MED ORDER — PREDNISONE 10 MG PO TABS: 40.0000 mg | ORAL_TABLET | Freq: Every day | ORAL | 0 refills | Status: AC

## 2021-11-20 MED ORDER — PATIROMER SORBITEX CALCIUM 8.4 G PO PACK
25.2000 g | PACK | Freq: Once | ORAL | Status: AC
  Administered 2021-11-20: 25.2 g via ORAL
  Filled 2021-11-20: qty 3

## 2021-11-20 MED ORDER — SODIUM ZIRCONIUM CYCLOSILICATE 10 G PO PACK
10.0000 g | PACK | Freq: Once | ORAL | Status: AC
  Administered 2021-11-20: 10 g via ORAL
  Filled 2021-11-20: qty 1

## 2021-11-20 MED ORDER — ALBUTEROL SULFATE (2.5 MG/3ML) 0.083% IN NEBU: 2.5000 mg | INHALATION_SOLUTION | RESPIRATORY_TRACT | Status: DC | PRN

## 2021-11-20 MED ORDER — DILTIAZEM HCL 90 MG PO TABS: 90.0000 mg | ORAL_TABLET | Freq: Two times a day (BID) | ORAL | 0 refills | Status: DC

## 2021-11-20 NOTE — TOC Transition Note (Signed)
Transition of Care [facility]) - CM/SW Discharge Note   Patient Details  Name: Rachel [name] MRN: [id number] Date of Birth: 1945-10-31  [facility]) CM/SW Contact:  Candie Chroman, LCSW Phone Number: 11/20/2021, 2:30 PM   Clinical Narrative:  Patient has orders to discharge back to [facility] today. RN has already called report. EMS transport has been arranged. They said there are several ahead of her on the list. No further concerns. CSW signing off.   Final next level of care: Skilled Nursing Facility Barriers to Discharge: Barriers Resolved   Patient Goals and CMS Choice     Choice offered to / list presented to : NA  Discharge Placement   Existing PASRR number confirmed : 11/17/21          Patient chooses bed at: Other - please specify in the comment section below: ([facility]) Patient to be transferred to facility by: EMS Name of family member notified: Jimmey Ralph Patient and family notified of of transfer: 11/20/21  Discharge Plan and Services     Post Acute Care Choice: Resumption of Svcs/PTA Provider                               Social Determinants of Health (SDOH) Interventions     Readmission Risk Interventions     No data to display

## 2021-11-20 NOTE — Discharge Summary (Signed)
Physician Discharge Summary   Patient: Rachel [name] MRN: [id number] DOB: February 04, 1946  Admit date:     11/16/2021  Discharge date: 11/20/21  Discharge Physician: Loletha Grayer   PCP: Albina Billet, MD   Recommendations at discharge:   Follow-up PCP at long-term facility 5 days Recommend checking a BMP in 3 days  Discharge Diagnoses: Principal Problem:   Hyperkalemia Active Problems:   Acute on chronic respiratory failure with hypoxemia (HCC)   COPD exacerbation (HCC)   Acute on chronic diastolic CHF (congestive heart failure) (HCC)   AKI (acute kidney injury) [facility])   Essential hypertension   Dyslipidemia   Hospital Course: The patient was admitted on 11/16/2021 and discharged on 11/20/2021.  Came in with shortness of breath initially requiring BiPAP.  She then was placed on high flow nasal cannula once coming off the BiPAP.  She was tapered down to her regular nasal cannula 4 L.  Solu-Medrol was started for COPD exacerbation.  The last couple days she has had hyperkalemia.  Her lisinopril was discontinued because of the hyperkalemia.  I did give Lokelma during the hospital course and also Veltassa.  Recommend checking a BMP and follow-up appointment.  Last potassium 5.1 which is high normal range.  Assessment and Plan: * Hyperkalemia Treated with Veltassa this morning and repeat potassium this afternoon still high normal range at 5.1.  Will give a dose of Lokelma.  Holding lisinopril.  Not on any potassium replacement.  Recommend checking BMP in 3 days  COPD exacerbation ([place]) Acute on chronic respiratory failure with hypoxemia Patient presented with acute respiratory distress satting at 83 to 84% on 5 L arriving on NRB, placed on BiPAP on arrival Patient back to baseline 4 L oxygen.  We will give today's Solu-Medrol prior to disposition and tomorrow 1 more dose of prednisone  Acute on chronic respiratory failure with hypoxemia ([place]) Secondary to COPD exacerbation.  See  above.   Acute on chronic diastolic CHF (congestive heart failure) ([place]) I think CHF is ruled out with a normal BNP and normal EF.  AKI (acute kidney injury) ([place]) Creatinine was 1.25 2 days ago.  Received a fluid bolus yesterday.  Creatinine 1.07 today, up from baseline of 0.86.    Essential hypertension Holding lisinopril with hyperkalemia.  Holding hydralazine.  Continue lower dose of diltiazem.  Dyslipidemia Continue lovastatin         Consultants: None Procedures performed: None Disposition: Back to her long-term care facility with intermittent physical therapy Diet recommendation:  Cardiac diet, low potassium diet DISCHARGE MEDICATION: Allergies as of 11/20/2021   No Known Allergies      Medication List     STOP taking these medications    hydrALAZINE 25 MG tablet Commonly known as: APRESOLINE   HYDROcodone-acetaminophen 5-325 MG tablet Commonly known as: NORCO/VICODIN   lisinopril 20 MG tablet Commonly known as: ZESTRIL   zolpidem 10 MG tablet Commonly known as: AMBIEN       TAKE these medications    acetaminophen 325 MG tablet Commonly known as: TYLENOL Take 2 tablets (650 mg total) by mouth every 6 (six) hours as needed for mild pain.   Acidophilus Probiotic Tabs Take 1 tablet by mouth in the morning and at bedtime.   albuterol 108 (90 Base) MCG/ACT inhaler Commonly known as: VENTOLIN HFA Inhale 1-2 puffs into the lungs every 4 (four) hours as needed for wheezing or shortness of breath.   Benefiber Chew Chew 1 tablet by mouth daily.   diltiazem  90 MG tablet Commonly known as: CARDIZEM Take 1 tablet (90 mg total) by mouth 2 (two) times daily. What changed:  medication strength how much to take when to take this   FLUoxetine 10 MG capsule Commonly known as: PROZAC Take 10 mg by mouth daily.   gabapentin 100 MG capsule Commonly known as: NEURONTIN Take 100 mg by mouth 2 (two) times daily.   Geri-Lanta 200-200-20 MG/5ML  suspension Generic drug: alum & mag hydroxide-simeth Take 15 mLs by mouth every 8 (eight) hours as needed.   lovastatin 20 MG tablet Commonly known as: MEVACOR Take 20 mg by mouth every morning.   memantine 10 MG tablet Commonly known as: NAMENDA Take 10 mg by mouth 2 (two) times daily.   omeprazole 20 MG capsule Commonly known as: PRILOSEC Take 20 mg by mouth daily.   predniSONE 10 MG tablet Commonly known as: DELTASONE Take 4 tablets (40 mg total) by mouth daily for 1 day. Start taking on: November 21, 2021   senna 8.6 MG tablet Commonly known as: SENOKOT Take 1 tablet by mouth in the morning.   umeclidinium-vilanterol 62.5-25 MCG/INH Aepb Commonly known as: Anoro Ellipta Inhale 1 puff into the lungs daily.        Contact information for after-discharge care     Destination     [facility] .   Service: Skilled Nursing Contact information: [address] [place] [phone]                    Discharge Exam: Filed Weights   11/18/21 0513 11/19/21 0600 11/20/21 0439  Weight: 120 kg 120 kg 121.5 kg   Physical Exam HENT:     Head: Normocephalic.     Mouth/Throat:     Pharynx: No oropharyngeal exudate.  Eyes:     General: Lids are normal.     Conjunctiva/sclera: Conjunctivae normal.  Cardiovascular:     Rate and Rhythm: Normal rate and regular rhythm.     Heart sounds: Normal heart sounds, S1 normal and S2 normal.  Pulmonary:     Breath sounds: No transmitted upper airway sounds. Examination of the right-lower field reveals decreased breath sounds. Examination of the left-lower field reveals decreased breath sounds. Decreased breath sounds present. No wheezing, rhonchi or rales.  Abdominal:     Palpations: Abdomen is soft.     Tenderness: There is no abdominal tenderness.  Musculoskeletal:     Right lower leg: Swelling present.     Left lower leg: Swelling present.  Skin:    General: Skin is  warm.     Findings: No rash.  Neurological:     Mental Status: She is alert and oriented to person, place, and time.      Condition at discharge: stable  The results of significant diagnostics from this hospitalization (including imaging, microbiology, ancillary and laboratory) are listed below for reference.   Imaging Studies: ECHOCARDIOGRAM COMPLETE  Result Date: 11/16/2021    ECHOCARDIOGRAM REPORT   Patient Name:   Rachel [name] Date of Exam: 11/16/2021 Medical Rec #:  [id number]          Height:       66.0 in Accession #:    [id number]         Weight:       185.0 lb Date of Birth:  02-21-1946         BSA:          1.935  m Patient Age:    49 years           BP:           141/84 mmHg Patient Gender: F                  HR:           101 bpm. Exam Location:  [facility] Procedure: 2D Echo, Color Doppler, Cardiac Doppler and Intracardiac            Opacification Agent Indications:     I50.31 congestive heart failure-Acute Diastolic  History:         Patient has prior history of Echocardiogram examinations. Risk                  Factors:Hypertension and Dyslipidemia.  Sonographer:     Charmayne Sheer Referring Phys:  [id number] Athena Masse Diagnosing Phys: Ida Rogue MD  Sonographer Comments: Technically difficult study due to poor echo windows. Image acquisition challenging due to patient body habitus and Image acquisition challenging due to respiratory motion. IMPRESSIONS  1. Left ventricular ejection fraction, by estimation, is 55 to 60%. The left ventricle has normal function. The left ventricle has no regional wall motion abnormalities. Left ventricular diastolic parameters are consistent with Grade I diastolic dysfunction (impaired relaxation).  2. Right ventricular systolic function is normal. The right ventricular size is normal. Tricuspid regurgitation signal is inadequate for assessing PA pressure.  3. The mitral valve is normal in structure. No evidence of mitral valve regurgitation. No evidence of  mitral stenosis.  4. The aortic valve was not well visualized. Aortic valve regurgitation is not visualized. Aortic valve sclerosis/calcification is present, without any evidence of aortic stenosis. Aortic valve area, by VTI measures 1.75 cm. Aortic valve mean gradient measures 12.0 mmHg.  5. The inferior vena cava is normal in size with greater than 50% respiratory variability, suggesting right atrial pressure of 3 mmHg. FINDINGS  Left Ventricle: Left ventricular ejection fraction, by estimation, is 55 to 60%. The left ventricle has normal function. The left ventricle has no regional wall motion abnormalities. Definity contrast agent was given IV to delineate the left ventricular  endocardial borders. The left ventricular internal cavity size was normal in size. There is no left ventricular hypertrophy. Left ventricular diastolic parameters are consistent with Grade I diastolic dysfunction (impaired relaxation). Right Ventricle: The right ventricular size is normal. No increase in right ventricular wall thickness. Right ventricular systolic function is normal. Tricuspid regurgitation signal is inadequate for assessing PA pressure. Left Atrium: Left atrial size was normal in size. Right Atrium: Right atrial size was normal in size. Pericardium: There is no evidence of pericardial effusion. Mitral Valve: The mitral valve is normal in structure. No evidence of mitral valve regurgitation. No evidence of mitral valve stenosis. Tricuspid Valve: The tricuspid valve is normal in structure. Tricuspid valve regurgitation is not demonstrated. No evidence of tricuspid stenosis. Aortic Valve: The aortic valve was not well visualized. Aortic valve regurgitation is not visualized. Aortic valve sclerosis/calcification is present, without any evidence of aortic stenosis. Aortic valve mean gradient measures 12.0 mmHg. Aortic valve peak gradient measures 22.1 mmHg. Aortic valve area, by VTI measures 1.75 cm. Pulmonic Valve: The  pulmonic valve was normal in structure. Pulmonic valve regurgitation is not visualized. No evidence of pulmonic stenosis. Aorta: The aortic root is normal in size and structure. Venous: The inferior vena cava is normal in size with greater than 50% respiratory variability, suggesting right atrial  pressure of 3 mmHg. IAS/Shunts: No atrial level shunt detected by color flow Doppler.  LEFT VENTRICLE PLAX 2D LVOT diam:     1.80 cm     Diastology LV SV:         63          LV e' medial:    7.62 cm/s LV SV Index:   33          LV E/e' medial:  12.2 LVOT Area:     2.54 cm    LV e' lateral:   6.96 cm/s                            LV E/e' lateral: 13.4  LV Volumes (MOD) LV vol d, MOD A2C: 95.0 ml LV vol d, MOD A4C: 46.4 ml LV vol s, MOD A2C: 44.5 ml LV vol s, MOD A4C: 16.2 ml LV SV MOD A2C:     50.5 ml LV SV MOD A4C:     46.4 ml LV SV MOD BP:      39.7 ml RIGHT VENTRICLE RV Basal diam:  4.06 cm TAPSE (M-mode): 2.6 cm LEFT ATRIUM           Index        RIGHT ATRIUM           Index LA Vol (A4C): 97.7 ml 50.50 ml/m  RA Area:     17.50 cm                                    RA Volume:   47.10 ml  24.35 ml/m  AORTIC VALVE                     PULMONIC VALVE AV Area (Vmax):    1.64 cm      PV Vmax:       1.51 m/s AV Area (Vmean):   1.52 cm      PV Peak grad:  9.1 mmHg AV Area (VTI):     1.75 cm AV Vmax:           235.00 cm/s AV Vmean:          155.000 cm/s AV VTI:            0.362 m AV Peak Grad:      22.1 mmHg AV Mean Grad:      12.0 mmHg LVOT Vmax:         151.00 cm/s LVOT Vmean:        92.600 cm/s LVOT VTI:          0.249 m LVOT/AV VTI ratio: 0.69  AORTA Ao Root diam: 3.30 cm MITRAL VALVE MV Area (PHT): 5.79 cm     SHUNTS MV Decel Time: 131 msec     Systemic VTI:  0.25 m MV E velocity: 93.00 cm/s   Systemic Diam: 1.80 cm MV A velocity: 137.00 cm/s MV E/A ratio:  0.68 Ida Rogue MD Electronically signed by Ida Rogue MD Signature Date/Time: 11/16/2021/5:27:38 PM    Final    DG Chest Portable 1 View  Result Date:  11/16/2021 CLINICAL DATA:  Shortness of breath EXAM: PORTABLE CHEST 1 VIEW COMPARISON:  07/17/2020 FINDINGS: Cardiac shadow is stable. Chronic scarring and thickening of the minor fissure on the right is seen. Increased interstitial changes are noted particularly on the left consistent  with edema. No focal infiltrate is noted. IMPRESSION: Increasing interstitial edema when compare with the prior exam. Electronically Signed   By: Inez Catalina M.D.   On: 11/16/2021 02:36    Microbiology: Results for orders placed or performed during the hospital encounter of 11/16/21  SARS Coronavirus 2 by RT PCR (hospital order, performed in [facility] hospital lab) *cepheid single result test* Anterior Nasal Swab     Status: None   Collection Time: 11/16/21  2:11 AM   Specimen: Anterior Nasal Swab  Result Value Ref Range Status   SARS Coronavirus 2 by RT PCR NEGATIVE NEGATIVE Final    Comment: (NOTE) SARS-CoV-2 target nucleic acids are NOT DETECTED.  The SARS-CoV-2 RNA is generally detectable in upper and lower respiratory specimens during the acute phase of infection. The lowest concentration of SARS-CoV-2 viral copies this assay can detect is 250 copies / mL. A negative result does not preclude SARS-CoV-2 infection and should not be used as the sole basis for treatment or other patient management decisions.  A negative result may occur with improper specimen collection / handling, submission of specimen other than nasopharyngeal swab, presence of viral mutation(s) within the areas targeted by this assay, and inadequate number of viral copies (<250 copies / mL). A negative result must be combined with clinical observations, patient history, and epidemiological information.  Fact Sheet for Patients:   https://www.patel.info/  Fact Sheet for Healthcare Providers: https://hall.com/  This test is not yet approved or  cleared by the [place] FDA and has been  authorized for detection and/or diagnosis of SARS-CoV-2 by FDA under an Emergency Use Authorization (EUA).  This EUA will remain in effect (meaning this test can be used) for the duration of the COVID-19 declaration under Section 564(b)(1) of the Act, 21 U.S.C. section 360bbb-3(b)(1), unless the authorization is terminated or revoked sooner.  Performed at [facility], [address] [place], [place] [address]     Labs: CBC: Recent Labs  Lab 11/16/21 0211 11/18/21 0421  WBC 16.4* 12.8*  NEUTROABS 13.0*  --   HGB 12.3 11.1*  HCT 39.1 35.2*  MCV 98.0 97.2  PLT 210 466   Basic Metabolic Panel: Recent Labs  Lab 11/16/21 0211 11/18/21 0421 11/19/21 0159 11/19/21 1220 11/20/21 0319 11/20/21 1235  NA 138 138 137  --  137  --   K 3.9 5.4* 5.4* 5.2* 5.2* 5.1  CL 109 111 110  --  108  --   CO2 '24 22 23  '$ --  24  --   GLUCOSE 151* 144* 157*  --  147*  --   BUN 33* 51* 59*  --  54*  --   CREATININE 1.05* 1.25* 1.19*  --  1.07*  --   CALCIUM 8.7* 9.3 9.4  --  9.1  --   MG 1.7  --   --   --   --   --    Liver Function Tests: Recent Labs  Lab 11/16/21 0211  AST 23  ALT 20  ALKPHOS 106  BILITOT 0.5  PROT 7.6  ALBUMIN 4.1     Discharge time spent: greater than 30 minutes.  Signed: Loletha Grayer, MD [facility] 11/20/2021

## 2021-11-20 NOTE — Progress Notes (Signed)
Call facility and spoke to [facility]. Gave report no questions at this time.

## 2021-11-22 LAB — BLOOD GAS, ARTERIAL
Acid-base deficit: 4.4 mmol/L — ABNORMAL HIGH (ref 0.0–2.0)
Bicarbonate: 20.2 mmol/L (ref 20.0–28.0)
Delivery systems: POSITIVE
Expiratory PAP: 6 cmH2O
FIO2: 40 %
Inspiratory PAP: 12 cmH2O
O2 Saturation: 99.4 %
Patient temperature: 37
pCO2 arterial: 35 mmHg (ref 32–48)
pH, Arterial: 7.37 (ref 7.35–7.45)
pO2, Arterial: 116 mmHg — ABNORMAL HIGH (ref 83–108)

## 2023-11-04 ENCOUNTER — Other Ambulatory Visit: Payer: Self-pay

## 2023-11-04 ENCOUNTER — Inpatient Hospital Stay
Admission: EM | Admit: 2023-11-04 | Discharge: 2023-11-13 | DRG: 871 | Disposition: A | Discharge status: Hospice | Source: Skilled Nursing Facility | Attending: Internal Medicine | Admitting: Internal Medicine

## 2023-11-04 ENCOUNTER — Emergency Department

## 2023-11-04 ENCOUNTER — Encounter: Payer: Self-pay | Admitting: *Deleted

## 2023-11-04 DIAGNOSIS — I5033 Acute on chronic diastolic (congestive) heart failure: Secondary | ICD-10-CM | POA: Diagnosis present

## 2023-11-04 DIAGNOSIS — J441 Chronic obstructive pulmonary disease with (acute) exacerbation: Secondary | ICD-10-CM | POA: Diagnosis present

## 2023-11-04 DIAGNOSIS — Z515 Encounter for palliative care: Secondary | ICD-10-CM | POA: Diagnosis not present

## 2023-11-04 DIAGNOSIS — H9190 Unspecified hearing loss, unspecified ear: Secondary | ICD-10-CM | POA: Diagnosis present

## 2023-11-04 DIAGNOSIS — I4819 Other persistent atrial fibrillation: Secondary | ICD-10-CM | POA: Diagnosis present

## 2023-11-04 DIAGNOSIS — E785 Hyperlipidemia, unspecified: Secondary | ICD-10-CM | POA: Diagnosis present

## 2023-11-04 DIAGNOSIS — Z811 Family history of alcohol abuse and dependence: Secondary | ICD-10-CM

## 2023-11-04 DIAGNOSIS — Z6841 Body Mass Index (BMI) 40.0 and over, adult: Secondary | ICD-10-CM | POA: Diagnosis not present

## 2023-11-04 DIAGNOSIS — Z66 Do not resuscitate: Secondary | ICD-10-CM | POA: Diagnosis present

## 2023-11-04 DIAGNOSIS — J9601 Acute respiratory failure with hypoxia: Secondary | ICD-10-CM | POA: Diagnosis not present

## 2023-11-04 DIAGNOSIS — Z818 Family history of other mental and behavioral disorders: Secondary | ICD-10-CM

## 2023-11-04 DIAGNOSIS — Z7901 Long term (current) use of anticoagulants: Secondary | ICD-10-CM | POA: Diagnosis not present

## 2023-11-04 DIAGNOSIS — E872 Acidosis, unspecified: Secondary | ICD-10-CM | POA: Diagnosis present

## 2023-11-04 DIAGNOSIS — I110 Hypertensive heart disease with heart failure: Secondary | ICD-10-CM | POA: Diagnosis present

## 2023-11-04 DIAGNOSIS — J9621 Acute and chronic respiratory failure with hypoxia: Secondary | ICD-10-CM | POA: Diagnosis present

## 2023-11-04 DIAGNOSIS — Z9621 Cochlear implant status: Secondary | ICD-10-CM | POA: Diagnosis present

## 2023-11-04 DIAGNOSIS — N179 Acute kidney failure, unspecified: Secondary | ICD-10-CM | POA: Diagnosis present

## 2023-11-04 DIAGNOSIS — J189 Pneumonia, unspecified organism: Secondary | ICD-10-CM | POA: Diagnosis present

## 2023-11-04 DIAGNOSIS — J440 Chronic obstructive pulmonary disease with acute lower respiratory infection: Secondary | ICD-10-CM | POA: Diagnosis present

## 2023-11-04 DIAGNOSIS — E871 Hypo-osmolality and hyponatremia: Secondary | ICD-10-CM | POA: Diagnosis present

## 2023-11-04 DIAGNOSIS — Z7189 Other specified counseling: Secondary | ICD-10-CM | POA: Diagnosis not present

## 2023-11-04 DIAGNOSIS — I5031 Acute diastolic (congestive) heart failure: Secondary | ICD-10-CM | POA: Diagnosis not present

## 2023-11-04 DIAGNOSIS — E66813 Obesity, class 3: Secondary | ICD-10-CM | POA: Diagnosis present

## 2023-11-04 DIAGNOSIS — Z9981 Dependence on supplemental oxygen: Secondary | ICD-10-CM | POA: Diagnosis not present

## 2023-11-04 DIAGNOSIS — I4891 Unspecified atrial fibrillation: Secondary | ICD-10-CM | POA: Diagnosis not present

## 2023-11-04 DIAGNOSIS — E876 Hypokalemia: Secondary | ICD-10-CM | POA: Diagnosis present

## 2023-11-04 DIAGNOSIS — R1011 Right upper quadrant pain: Secondary | ICD-10-CM | POA: Diagnosis not present

## 2023-11-04 DIAGNOSIS — Z87891 Personal history of nicotine dependence: Secondary | ICD-10-CM | POA: Diagnosis not present

## 2023-11-04 DIAGNOSIS — Z801 Family history of malignant neoplasm of trachea, bronchus and lung: Secondary | ICD-10-CM

## 2023-11-04 DIAGNOSIS — Z79899 Other long term (current) drug therapy: Secondary | ICD-10-CM

## 2023-11-04 DIAGNOSIS — A419 Sepsis, unspecified organism: Secondary | ICD-10-CM | POA: Diagnosis present

## 2023-11-04 DIAGNOSIS — I2489 Other forms of acute ischemic heart disease: Secondary | ICD-10-CM | POA: Diagnosis present

## 2023-11-04 DIAGNOSIS — R0902 Hypoxemia: Secondary | ICD-10-CM

## 2023-11-04 DIAGNOSIS — R0603 Acute respiratory distress: Secondary | ICD-10-CM | POA: Diagnosis present

## 2023-11-04 DIAGNOSIS — Z1152 Encounter for screening for COVID-19: Secondary | ICD-10-CM | POA: Diagnosis not present

## 2023-11-04 DIAGNOSIS — Z808 Family history of malignant neoplasm of other organs or systems: Secondary | ICD-10-CM

## 2023-11-04 DIAGNOSIS — I10 Essential (primary) hypertension: Secondary | ICD-10-CM | POA: Diagnosis present

## 2023-11-04 DIAGNOSIS — Z7951 Long term (current) use of inhaled steroids: Secondary | ICD-10-CM

## 2023-11-04 DIAGNOSIS — I509 Heart failure, unspecified: Secondary | ICD-10-CM

## 2023-11-04 HISTORY — DX: Morbid (severe) obesity due to excess calories: E66.01

## 2023-11-04 HISTORY — DX: Dependence on supplemental oxygen: Z99.81

## 2023-11-04 HISTORY — DX: Unspecified dementia, unspecified severity, without behavioral disturbance, psychotic disturbance, mood disturbance, and anxiety: F03.90

## 2023-11-04 HISTORY — DX: Heart failure, unspecified: I50.9

## 2023-11-04 HISTORY — DX: Chronic diastolic (congestive) heart failure: I50.32

## 2023-11-04 HISTORY — DX: Major depressive disorder, single episode, unspecified: F32.9

## 2023-11-04 HISTORY — DX: Chronic obstructive pulmonary disease, unspecified: J44.9

## 2023-11-04 LAB — CBC WITH DIFFERENTIAL/PLATELET
Abs Immature Granulocytes: 0.07 10*3/uL (ref 0.00–0.07)
Basophils Absolute: 0.1 10*3/uL (ref 0.0–0.1)
Basophils Relative: 0 %
Eosinophils Absolute: 0 10*3/uL (ref 0.0–0.5)
Eosinophils Relative: 0 %
HCT: 36.5 % (ref 36.0–46.0)
Hemoglobin: 12 g/dL (ref 12.0–15.0)
Immature Granulocytes: 0 %
Lymphocytes Relative: 12 %
Lymphs Abs: 2 10*3/uL (ref 0.7–4.0)
MCH: 31.1 pg (ref 26.0–34.0)
MCHC: 32.9 g/dL (ref 30.0–36.0)
MCV: 94.6 fL (ref 80.0–100.0)
Monocytes Absolute: 0.8 10*3/uL (ref 0.1–1.0)
Monocytes Relative: 5 %
Neutro Abs: 14.1 10*3/uL — ABNORMAL HIGH (ref 1.7–7.7)
Neutrophils Relative %: 83 %
Platelets: 217 10*3/uL (ref 150–400)
RBC: 3.86 MIL/uL — ABNORMAL LOW (ref 3.87–5.11)
RDW: 13.6 % (ref 11.5–15.5)
WBC: 17.1 10*3/uL — ABNORMAL HIGH (ref 4.0–10.5)
nRBC: 0 % (ref 0.0–0.2)

## 2023-11-04 LAB — RESP PANEL BY RT-PCR (RSV, FLU A&B, COVID)  RVPGX2
Influenza A by PCR: NEGATIVE
Influenza B by PCR: NEGATIVE
Resp Syncytial Virus by PCR: NEGATIVE
SARS Coronavirus 2 by RT PCR: NEGATIVE

## 2023-11-04 LAB — LACTIC ACID, PLASMA
Lactic Acid, Venous: 2.6 mmol/L (ref 0.5–1.9)
Lactic Acid, Venous: 2.7 mmol/L (ref 0.5–1.9)

## 2023-11-04 LAB — BLOOD GAS, VENOUS
Acid-Base Excess: 0.8 mmol/L (ref 0.0–2.0)
Bicarbonate: 25.3 mmol/L (ref 20.0–28.0)
O2 Saturation: 81.2 %
Patient temperature: 37
pCO2, Ven: 39 mmHg — ABNORMAL LOW (ref 44–60)
pH, Ven: 7.42 (ref 7.25–7.43)
pO2, Ven: 49 mmHg — ABNORMAL HIGH (ref 32–45)

## 2023-11-04 LAB — COMPREHENSIVE METABOLIC PANEL WITH GFR
ALT: 14 U/L (ref 0–44)
AST: 23 U/L (ref 15–41)
Albumin: 3.3 g/dL — ABNORMAL LOW (ref 3.5–5.0)
Alkaline Phosphatase: 67 U/L (ref 38–126)
Anion gap: 13 (ref 5–15)
BUN: 58 mg/dL — ABNORMAL HIGH (ref 8–23)
CO2: 21 mmol/L — ABNORMAL LOW (ref 22–32)
Calcium: 9.1 mg/dL (ref 8.9–10.3)
Chloride: 95 mmol/L — ABNORMAL LOW (ref 98–111)
Creatinine, Ser: 1.62 mg/dL — ABNORMAL HIGH (ref 0.44–1.00)
GFR, Estimated: 32 mL/min — ABNORMAL LOW (ref >60)
Glucose, Bld: 160 mg/dL — ABNORMAL HIGH (ref 70–99)
Potassium: 3.8 mmol/L (ref 3.5–5.1)
Sodium: 129 mmol/L — ABNORMAL LOW (ref 135–145)
Total Bilirubin: 1.1 mg/dL (ref 0.0–1.2)
Total Protein: 7.8 g/dL (ref 6.5–8.1)

## 2023-11-04 LAB — PROTIME-INR
INR: 1.3 — ABNORMAL HIGH (ref 0.8–1.2)
Prothrombin Time: 16.1 s — ABNORMAL HIGH (ref 11.4–15.2)

## 2023-11-04 LAB — TROPONIN I (HIGH SENSITIVITY)
Troponin I (High Sensitivity): 44 ng/L — ABNORMAL HIGH (ref <18)
Troponin I (High Sensitivity): 39 ng/L — ABNORMAL HIGH (ref <18)

## 2023-11-04 LAB — BRAIN NATRIURETIC PEPTIDE: B Natriuretic Peptide: 347.2 pg/mL — ABNORMAL HIGH (ref 0.0–100.0)

## 2023-11-04 MED ORDER — VANCOMYCIN HCL IN DEXTROSE 1-5 GM/200ML-% IV SOLN
1000.0000 mg | Freq: Once | INTRAVENOUS | Status: AC
  Administered 2023-11-04: 1000 mg via INTRAVENOUS
  Filled 2023-11-04: qty 200

## 2023-11-04 MED ORDER — ACETAMINOPHEN 650 MG RE SUPP: 650.0000 mg | Freq: Four times a day (QID) | RECTAL | Status: DC | PRN

## 2023-11-04 MED ORDER — ALBUTEROL SULFATE (2.5 MG/3ML) 0.083% IN NEBU
2.5000 mg | INHALATION_SOLUTION | RESPIRATORY_TRACT | Status: DC | PRN
  Administered 2023-11-09: 2.5 mg via RESPIRATORY_TRACT
  Filled 2023-11-04: qty 3

## 2023-11-04 MED ORDER — IPRATROPIUM-ALBUTEROL 0.5-2.5 (3) MG/3ML IN SOLN
3.0000 mL | Freq: Four times a day (QID) | RESPIRATORY_TRACT | Status: DC
  Administered 2023-11-05 – 2023-11-10 (×21): 3 mL via RESPIRATORY_TRACT
  Filled 2023-11-04 (×21): qty 3

## 2023-11-04 MED ORDER — DILTIAZEM LOAD VIA INFUSION
10.0000 mg | Freq: Once | INTRAVENOUS | Status: AC
  Administered 2023-11-04: 10 mg via INTRAVENOUS
  Filled 2023-11-04: qty 10

## 2023-11-04 MED ORDER — LACTATED RINGERS IV SOLN: INTRAVENOUS | Status: DC

## 2023-11-04 MED ORDER — LACTATED RINGERS IV SOLN
150.0000 mL/h | INTRAVENOUS | Status: DC
  Administered 2023-11-05 (×2): 150 mL/h via INTRAVENOUS

## 2023-11-04 MED ORDER — DILTIAZEM HCL-DEXTROSE 125-5 MG/125ML-% IV SOLN (PREMIX)
5.0000 mg/h | INTRAVENOUS | Status: DC
  Administered 2023-11-04: 5 mg/h via INTRAVENOUS
  Administered 2023-11-05 – 2023-11-06 (×5): 15 mg/h via INTRAVENOUS
  Administered 2023-11-07: 10 mg/h via INTRAVENOUS
  Administered 2023-11-07: 15 mg/h via INTRAVENOUS
  Filled 2023-11-04 (×8): qty 125

## 2023-11-04 MED ORDER — SODIUM CHLORIDE 0.9 % IV BOLUS
1000.0000 mL | Freq: Once | INTRAVENOUS | Status: AC
  Administered 2023-11-04: 1000 mL via INTRAVENOUS

## 2023-11-04 MED ORDER — ONDANSETRON HCL 4 MG/2ML IJ SOLN
4.0000 mg | Freq: Four times a day (QID) | INTRAMUSCULAR | Status: DC | PRN
  Administered 2023-11-05: 4 mg via INTRAVENOUS
  Filled 2023-11-04: qty 2

## 2023-11-04 MED ORDER — FUROSEMIDE 10 MG/ML IJ SOLN
40.0000 mg | Freq: Once | INTRAMUSCULAR | Status: AC
  Administered 2023-11-04: 40 mg via INTRAVENOUS
  Filled 2023-11-04: qty 4

## 2023-11-04 MED ORDER — IPRATROPIUM-ALBUTEROL 0.5-2.5 (3) MG/3ML IN SOLN
6.0000 mL | Freq: Once | RESPIRATORY_TRACT | Status: AC
  Administered 2023-11-04: 6 mL via RESPIRATORY_TRACT
  Filled 2023-11-04: qty 6

## 2023-11-04 MED ORDER — SODIUM CHLORIDE 0.9 % IV SOLN: 2.0000 g | Freq: Once | INTRAVENOUS | Status: DC

## 2023-11-04 MED ORDER — HEPARIN (PORCINE) 25000 UT/250ML-% IV SOLN
1400.0000 [IU]/h | INTRAVENOUS | Status: DC
  Administered 2023-11-04: 1250 [IU]/h via INTRAVENOUS
  Administered 2023-11-07: 1550 [IU]/h via INTRAVENOUS
  Administered 2023-11-08 – 2023-11-09 (×2): 1500 [IU]/h via INTRAVENOUS
  Filled 2023-11-04 (×8): qty 250

## 2023-11-04 MED ORDER — ONDANSETRON HCL 4 MG PO TABS
4.0000 mg | ORAL_TABLET | Freq: Four times a day (QID) | ORAL | Status: DC | PRN
Start: 2023-11-04 — End: 2023-11-13

## 2023-11-04 MED ORDER — HEPARIN BOLUS VIA INFUSION
4400.0000 [IU] | Freq: Once | INTRAVENOUS | Status: AC
  Administered 2023-11-04: 4400 [IU] via INTRAVENOUS
  Filled 2023-11-04: qty 4400

## 2023-11-04 MED ORDER — SODIUM CHLORIDE 0.9 % IV SOLN
2.0000 g | Freq: Once | INTRAVENOUS | Status: AC
  Administered 2023-11-04: 2 g via INTRAVENOUS
  Filled 2023-11-04: qty 12.5

## 2023-11-04 MED ORDER — FUROSEMIDE 10 MG/ML IJ SOLN
20.0000 mg | Freq: Two times a day (BID) | INTRAMUSCULAR | Status: DC
  Administered 2023-11-05 (×2): 20 mg via INTRAVENOUS
  Filled 2023-11-04 (×2): qty 2

## 2023-11-04 MED ORDER — ACETAMINOPHEN 325 MG PO TABS
650.0000 mg | ORAL_TABLET | Freq: Four times a day (QID) | ORAL | Status: DC | PRN
Start: 2023-11-04 — End: 2023-11-13
  Administered 2023-11-05 – 2023-11-12 (×12): 650 mg via ORAL
  Filled 2023-11-04 (×13): qty 2

## 2023-11-04 NOTE — Assessment & Plan Note (Signed)
 Chest x-ray showing possible edema, BNP pending Received a dose of IV Lasix  in the ED Given fever and high suspicion for pneumonia with sepsis, will hold off on Lasix  and monitor closely and restart if needed Echocardiogram Daily weights with intake and output monitoring Cardiology consult

## 2023-11-04 NOTE — Assessment & Plan Note (Signed)
 Currently on diltiazem  infusion

## 2023-11-04 NOTE — Assessment & Plan Note (Signed)
 Uncertain etiology question volume overload given possible CHF We will get urine sodium and urine and serum osmolalities Received NS boluses in the ED Continue to monitor serum sodium

## 2023-11-04 NOTE — ED Provider Notes (Signed)
 [facility] Provider Note    Event Date/Time   First MD Initiated Contact with Patient 11/04/23 2105     (approximate)   History   Shortness of Breath   HPI  Rachel [name] is a 78 y.o. female past medical history significant for CHF, COPD, hypertension, hyperlipidemia, obesity, presents to the emergency department with respiratory distress from nursing facility.  They were called out for shortness of breath and altered mental status.  When they arrived patient was in severe respiratory distress febrile and with diffuse wheezing.  Patient unable to give any further history.  No reported history of atrial fibrillation.  EMS gave IV Solu-Medrol  125 mg, 2 g of IV magnesium , 2 continuous DuoNebs and was placed on a CPAP machine.  Patient received 1000 mg of Tylenol  prior to arrival.  Patient arrives with a MOST form in place that is DNR/DNI but would want BiPAP, antibiotics and admission.  Patient's healthcare power of attorney is her son Garrel who lives in [place] , phone number is [phone]     Physical Exam   Triage Vital Signs: ED Triage Vitals  Encounter Vitals Group     BP 11/04/23 2106 (!) 127/92     Girls Systolic BP Percentile --      Girls Diastolic BP Percentile --      Boys Systolic BP Percentile --      Boys Diastolic BP Percentile --      Pulse Rate 11/04/23 2106 (!) 177     Resp 11/04/23 2106 (!) 38     Temp 11/04/23 2146 (!) 100.8 F (38.2 C)     Temp Source 11/04/23 2146 Rectal     SpO2 11/04/23 2106 92 %     Weight 11/04/23 2100 266 lb 12.1 oz (121 kg)     Height 11/04/23 2100 5' 6 (1.676 m)     Head Circumference --      Peak Flow --      Pain Score --      Pain Loc --      Pain Education --      Exclude from Growth Chart --     Most recent vital signs: Vitals:   11/04/23 2245 11/04/23 2315  BP:  (!) 141/122  Pulse: 81 (!) 165  Resp: 18 (!) 23  Temp:    SpO2: 98% 99%    Physical Exam Constitutional:       Appearance: She is well-developed. She is obese. She is ill-appearing.  HENT:     Head: Atraumatic.   Eyes:     Conjunctiva/sclera: Conjunctivae normal.    Cardiovascular:     Rate and Rhythm: Tachycardia present. Rhythm irregular.     Comments: Heart rate of to 180s Pulmonary:     Effort: Tachypnea, accessory muscle usage and respiratory distress present.     Breath sounds: Wheezing, rhonchi and rales present.     Comments: Arrives on CPAP.  Signs of respiratory distress. Abdominal:     General: There is no distension.     Palpations: Abdomen is soft.     Tenderness: There is no abdominal tenderness.   Musculoskeletal:        General: Normal range of motion.     Cervical back: Normal range of motion.     Right lower leg: Edema present.     Left lower leg: Edema present.   Skin:    General: Skin is warm.     Capillary Refill: Capillary refill  takes 2 to 3 seconds.   Neurological:     General: No focal deficit present.     Mental Status: She is alert. Mental status is at baseline.     IMPRESSION / MDM / ASSESSMENT AND PLAN / ED COURSE  I reviewed the triage vital signs and the nursing notes.  Differential diagnosis including sepsis, pneumonia, heart failure exacerbation, COPD exacerbation, anemia, ACS, pulmonary embolism   Patient with severe respiratory distress on arrival.  Appears to have new onset atrial fibrillation on monitor review of her MAR does not have a prior diagnosis of A-fib and is not on anticoagulation or any rate controlling medications.  Given concern for sepsis with her fever and concern for pneumonia, will give 1 L of IV fluids and reevaluate for any improvement of her tachycardia.  Full to 30 cc/kg of IV fluids would be detrimental given concern for possible heart failure, will give 1 L of IV fluids and reevaluate.  Blood cultures obtained and started on broad-spectrum antibiotics to cover for hospital-acquired pneumonia  EKG  I, Clotilda Punter,  the attending physician, personally viewed and interpreted this ECG.  Atrial fibrillation with rapid rate of 180.  QTc 45.  Nonspecific ST changes.  Significant change when compared to prior EKG.  Atrial fibrillation with rapid rate while on cardiac telemetry.  RADIOLOGY I independently reviewed imaging, my interpretation of imaging: Chest x-ray with significant pulmonary edema versus bilateral lower lobe pneumonia   LABS (all labs ordered are listed, but only abnormal results are displayed) Labs interpreted as -    Labs Reviewed  LACTIC ACID, PLASMA - Abnormal; Notable for the following components:      Result Value   Lactic Acid, Venous 2.6 (*)    All other components within normal limits  LACTIC ACID, PLASMA - Abnormal; Notable for the following components:   Lactic Acid, Venous 2.7 (*)    All other components within normal limits  COMPREHENSIVE METABOLIC PANEL WITH GFR - Abnormal; Notable for the following components:   Sodium 129 (*)    Chloride 95 (*)    CO2 21 (*)    Glucose, Bld 160 (*)    BUN 58 (*)    Creatinine, Ser 1.62 (*)    Albumin 3.3 (*)    GFR, Estimated 32 (*)    All other components within normal limits  CBC WITH DIFFERENTIAL/PLATELET - Abnormal; Notable for the following components:   WBC 17.1 (*)    RBC 3.86 (*)    Neutro Abs 14.1 (*)    All other components within normal limits  PROTIME-INR - Abnormal; Notable for the following components:   Prothrombin Time 16.1 (*)    INR 1.3 (*)    All other components within normal limits  URINALYSIS, W/ REFLEX TO CULTURE (INFECTION SUSPECTED) - Abnormal; Notable for the following components:   Color, Urine YELLOW (*)    APPearance HAZY (*)    Protein, ur 30 (*)    Leukocytes,Ua TRACE (*)    Bacteria, UA RARE (*)    All other components within normal limits  BRAIN NATRIURETIC PEPTIDE - Abnormal; Notable for the following components:   B Natriuretic Peptide 347.2 (*)    All other components within normal  limits  BLOOD GAS, VENOUS - Abnormal; Notable for the following components:   pCO2, Ven 39 (*)    pO2, Ven 49 (*)    All other components within normal limits  HEPARIN LEVEL (UNFRACTIONATED) - Abnormal; Notable for the following  components:   Heparin Unfractionated <0.10 (*)    All other components within normal limits  TROPONIN I (HIGH SENSITIVITY) - Abnormal; Notable for the following components:   Troponin I (High Sensitivity) 44 (*)    All other components within normal limits  TROPONIN I (HIGH SENSITIVITY) - Abnormal; Notable for the following components:   Troponin I (High Sensitivity) 39 (*)    All other components within normal limits  RESP PANEL BY RT-PCR (RSV, FLU A&B, COVID)  RVPGX2  CULTURE, BLOOD (ROUTINE X 2)  CULTURE, BLOOD (ROUTINE X 2)  APTT  PROCALCITONIN  BLOOD GAS, VENOUS  CBC  BASIC METABOLIC PANEL WITH GFR  OSMOLALITY  OSMOLALITY, URINE  SODIUM, URINE, RANDOM  HIV ANTIBODY (ROUTINE TESTING W REFLEX)     MDM  Patient was placed on BiPAP on arrival.  Lactic acid elevated at 2.6.  Troponin elevated at 44 which is likely demand ischemia in the setting of new onset atrial fibrillation with rapid rate, concern for heart failure and possible pneumonia with severe hypoxia.  Patient was treated with COPD exacerbation with IV Solu-Medrol , IV magnesium  and continuous DuoNebs with BiPAP  COVID influenza testing are negative  Hyponatremia which is likely in the setting of volume overload.  Creatinine significantly elevated from her baseline at 1.6 from baseline of 1.  Does have a BUN that appears to be stable and at her baseline.  Leukocytosis of 17.  BNP is elevated in the 300s which is increased from her normal.  Started on heparin and diltiazem  bolus and infusion.  Discussed critical nature with her son over the phone.  Patient admitted for acute hypoxic respiratory failure in the setting of CHF exacerbation, pneumonia and COPD exacerbation and new onset atrial  fibrillation with rapid rate.       PROCEDURES:  Critical Care performed: yes  .Critical Care  Performed by: Suzanne Kirsch, MD Authorized by: Suzanne Kirsch, MD   Critical care provider statement:    Critical care time (minutes):  45   Critical care time was exclusive of:  Separately billable procedures and treating other patients   Critical care was necessary to treat or prevent imminent or life-threatening deterioration of the following conditions:  Respiratory failure   Critical care was time spent personally by me on the following activities:  Development of treatment plan with patient or surrogate, discussions with consultants, evaluation of patient's response to treatment, examination of patient, ordering and review of laboratory studies, ordering and review of radiographic studies, ordering and performing treatments and interventions, pulse oximetry, re-evaluation of patient's condition and review of old charts   Care discussed with: admitting provider     Patient's presentation is most consistent with acute presentation with potential threat to life or bodily function.   MEDICATIONS ORDERED IN ED: Medications  lactated ringers infusion ( Intravenous New Bag/Given 11/04/23 2115)  diltiazem  (CARDIZEM ) 1 mg/mL load via infusion 10 mg (10 mg Intravenous Bolus from Bag 11/04/23 2220)    And  diltiazem  (CARDIZEM ) 125 mg in dextrose  5% 125 mL (1 mg/mL) infusion (10 mg/hr Intravenous Rate/Dose Change 11/04/23 2254)  heparin ADULT infusion 100 units/mL (25000 units/250mL) (1,250 Units/hr Intravenous New Bag/Given 11/04/23 2337)  lactated ringers infusion (has no administration in time range)  furosemide  (LASIX ) injection 20 mg (has no administration in time range)  ipratropium-albuterol  (DUONEB) 0.5-2.5 (3) MG/3ML nebulizer solution 3 mL (has no administration in time range)  albuterol  (PROVENTIL ) (2.5 MG/3ML) 0.083% nebulizer solution 2.5 mg (has no administration in time range)  acetaminophen  (TYLENOL ) tablet 650 mg (has no administration in time range)    Or  acetaminophen  (TYLENOL ) suppository 650 mg (has no administration in time range)  ondansetron  (ZOFRAN ) tablet 4 mg (has no administration in time range)    Or  ondansetron  (ZOFRAN ) injection 4 mg (has no administration in time range)  ceFEPIme (MAXIPIME) 2 g in sodium chloride  0.9 % 100 mL IVPB (has no administration in time range)  vancomycin  (VANCOREADY) IVPB 1500 mg/300 mL (has no administration in time range)  vancomycin  (VANCOREADY) IVPB 750 mg/150 mL (has no administration in time range)  vancomycin  (VANCOCIN ) IVPB 1000 mg/200 mL premix (0 mg Intravenous Stopped 11/04/23 2227)  ceFEPIme (MAXIPIME) 2 g in sodium chloride  0.9 % 100 mL IVPB (0 g Intravenous Stopped 11/04/23 2144)  ipratropium-albuterol  (DUONEB) 0.5-2.5 (3) MG/3ML nebulizer solution 6 mL (6 mLs Nebulization Given 11/04/23 2124)  sodium chloride  0.9 % bolus 1,000 mL (0 mLs Intravenous Stopped 11/04/23 2227)  furosemide  (LASIX ) injection 40 mg (40 mg Intravenous Given 11/04/23 2333)  heparin bolus via infusion 4,400 Units (4,400 Units Intravenous Bolus from Bag 11/04/23 2337)    FINAL CLINICAL IMPRESSION(S) / ED DIAGNOSES   Final diagnoses:  COPD exacerbation (HCC)  Hypoxia  Community acquired pneumonia, unspecified laterality  Acute on chronic congestive heart failure, unspecified heart failure type (HCC)  Atrial fibrillation with rapid ventricular response (HCC)     Rx / DC Orders   ED Discharge Orders          Ordered    Amb referral to AFIB Clinic        11/04/23 2359             Note:  This document was prepared using Dragon voice recognition software and may include unintentional dictation errors.   Suzanne Kirsch, MD 11/05/23 [phone]

## 2023-11-04 NOTE — Assessment & Plan Note (Addendum)
 Secondary to CAP/sepsis, and CHF likely triggered by rapid A-fib PE among the differentials but lower suspicion.  Renal function precludes CTA.  Can consider V/Q in the a.m. Continue BiPAP and wean as tolerated to baseline O2 Treatment for each acute condition on the respective problem

## 2023-11-04 NOTE — Assessment & Plan Note (Signed)
 Complicating factor to overall prognosis and care

## 2023-11-04 NOTE — ED Notes (Signed)
 Purewick placed on pt.

## 2023-11-04 NOTE — Assessment & Plan Note (Addendum)
 Sepsis COPD exacerbation Sepsis criteria include fever, tachycardia and tachypnea with leukocytosis and lactic acidosis, airspace disease on chest x-ray Received sepsis fluid bolus in the ED Continue maintenance fluids, but with close monitoring for signs of fluid overload given CHF on differentials Continue vancomycin  and cefepime given severity of illness DuoNebs Q6 and as needed Holding off on IV steroids due to acute infection Antitussives, flutter valve and incentive spirometer once able to participate Follow procalcitonin

## 2023-11-04 NOTE — ED Notes (Signed)
 Md at bedside.  RT at bedside to place pt on bipap.

## 2023-11-04 NOTE — Sepsis Progress Note (Signed)
 Elink monitoring for the code sepsis protocol.

## 2023-11-04 NOTE — ED Triage Notes (Addendum)
 Pt brought in via ems from [facility].  On arrival to er, pt in resp distress, cpap in place.  Pt has fever and strong odor of urine. Pt in afib.  Pt alert. Hx copd   md in with pt.    Ems gave solumedrol, iv tylenol , 2 breathing treatments and cardizem .

## 2023-11-04 NOTE — ED Notes (Signed)
 Dr suzanne in with pt again
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
## 2023-11-04 NOTE — ED Notes (Signed)
 Md at bedside on arrival to er.  Pt with sob and placed on bipap on arrival to treatment room.  Iv's started and meds given.  breathing treatments given by RT.  Pt with urinary incontinence.  Pt cleaned and changed.  Family now in room  pt in afib/sinus tach on monitor.

## 2023-11-04 NOTE — Assessment & Plan Note (Addendum)
 Heart rate in the 180s, likely driven by acute respiratory failure Currently on diltiazem  infusion Started on heparin infusion in the ED Will get echocardiogram in the a.m. Cardiology consult

## 2023-11-04 NOTE — ED Notes (Signed)
 Report called to abby rn icu nurse.

## 2023-11-04 NOTE — ED Provider Notes (Incomplete)
 [facility] Provider Note    Event Date/Time   First MD Initiated Contact with Patient 11/04/23 2105     (approximate)   History   Shortness of Breath   HPI  Rachel [name] is a 78 y.o. female past medical history significant for CHF, COPD, hypertension, hyperlipidemia, obesity, presents to the emergency department with respiratory distress from nursing facility.  They were called out for shortness of breath and altered mental status.  When they arrived patient was in severe respiratory distress     Physical Exam   Triage Vital Signs: ED Triage Vitals  Encounter Vitals Group     BP 11/04/23 2106 (!) 127/92     Girls Systolic BP Percentile --      Girls Diastolic BP Percentile --      Boys Systolic BP Percentile --      Boys Diastolic BP Percentile --      Pulse Rate 11/04/23 2106 (!) 177     Resp 11/04/23 2106 (!) 38     Temp 11/04/23 2146 (!) 100.8 F (38.2 C)     Temp Source 11/04/23 2146 Rectal     SpO2 11/04/23 2106 92 %     Weight 11/04/23 2100 266 lb 12.1 oz (121 kg)     Height 11/04/23 2100 5' 6 (1.676 m)     Head Circumference --      Peak Flow --      Pain Score --      Pain Loc --      Pain Education --      Exclude from Growth Chart --     Most recent vital signs: Vitals:   11/04/23 2220 11/04/23 2230  BP: (!) 149/74 (!) 166/133  Pulse: (!) 165 (!) 149  Resp: (!) 23 (!) 22  Temp:    SpO2: 99% 99%    Physical Exam  IMPRESSION / MDM / ASSESSMENT AND PLAN / ED COURSE  I reviewed the triage vital signs and the nursing notes.  *** EKG  I, Clotilda Punter, the attending physician, personally viewed and interpreted this ECG.   Rate: Normal  Rhythm: Normal sinus  Axis: Normal  Intervals: Normal  ST&T Change: None  No tachycardic or bradycardic dysrhythmias while on cardiac telemetry.  RADIOLOGY I independently reviewed imaging, my interpretation of imaging: ***  LABS (all labs ordered are listed, but only  abnormal results are displayed) Labs interpreted as -    Labs Reviewed  LACTIC ACID, PLASMA - Abnormal; Notable for the following components:      Result Value   Lactic Acid, Venous 2.6 (*)    All other components within normal limits  COMPREHENSIVE METABOLIC PANEL WITH GFR - Abnormal; Notable for the following components:   Sodium 129 (*)    Chloride 95 (*)    CO2 21 (*)    Glucose, Bld 160 (*)    BUN 58 (*)    Creatinine, Ser 1.62 (*)    Albumin 3.3 (*)    GFR, Estimated 32 (*)    All other components within normal limits  CBC WITH DIFFERENTIAL/PLATELET - Abnormal; Notable for the following components:   WBC 17.1 (*)    RBC 3.86 (*)    Neutro Abs 14.1 (*)    All other components within normal limits  PROTIME-INR - Abnormal; Notable for the following components:   Prothrombin Time 16.1 (*)    INR 1.3 (*)    All other components within normal limits  BLOOD GAS, VENOUS - Abnormal; Notable for the following components:   pCO2, Ven 39 (*)    pO2, Ven 49 (*)    All other components within normal limits  TROPONIN I (HIGH SENSITIVITY) - Abnormal; Notable for the following components:   Troponin I (High Sensitivity) 44 (*)    All other components within normal limits  RESP PANEL BY RT-PCR (RSV, FLU A&B, COVID)  RVPGX2  CULTURE, BLOOD (ROUTINE X 2)  CULTURE, BLOOD (ROUTINE X 2)  LACTIC ACID, PLASMA  URINALYSIS, W/ REFLEX TO CULTURE (INFECTION SUSPECTED)  BRAIN NATRIURETIC PEPTIDE  BLOOD GAS, VENOUS  TROPONIN I (HIGH SENSITIVITY)     MDM       PROCEDURES:  Critical Care performed: No  Procedures  Patient's presentation is most consistent with {EM COPA:27473}   MEDICATIONS ORDERED IN ED: Medications  lactated ringers infusion ( Intravenous New Bag/Given 11/04/23 2115)  diltiazem  (CARDIZEM ) 1 mg/mL load via infusion 10 mg (10 mg Intravenous Bolus from Bag 11/04/23 2220)    And  diltiazem  (CARDIZEM ) 125 mg in dextrose  5% 125 mL (1 mg/mL) infusion (5 mg/hr  Intravenous New Bag/Given 11/04/23 2221)  vancomycin  (VANCOCIN ) IVPB 1000 mg/200 mL premix (0 mg Intravenous Stopped 11/04/23 2227)  ceFEPIme (MAXIPIME) 2 g in sodium chloride  0.9 % 100 mL IVPB (0 g Intravenous Stopped 11/04/23 2144)  ipratropium-albuterol  (DUONEB) 0.5-2.5 (3) MG/3ML nebulizer solution 6 mL (6 mLs Nebulization Given 11/04/23 2124)  sodium chloride  0.9 % bolus 1,000 mL (0 mLs Intravenous Stopped 11/04/23 2227)    FINAL CLINICAL IMPRESSION(S) / ED DIAGNOSES   Final diagnoses:  None     Rx / DC Orders   ED Discharge Orders     None        Note:  This document was prepared using Dragon voice recognition software and may include unintentional dictation errors.

## 2023-11-04 NOTE — Consult Note (Signed)
 CODE SEPSIS - PHARMACY COMMUNICATION  **Broad Spectrum Antibiotics should be administered within 1 hour of Sepsis diagnosis**  Time Code Sepsis Called/Page Received: 2105  Antibiotics Ordered: cefepime, vancomycin   Time of 1st antibiotic administration: 2117  Additional action taken by pharmacy: n/a  If necessary, Name of Provider/Nurse Contacted: n/a    Shewanda Sharpe A Marisella Puccio ,PharmD Clinical Pharmacist  11/04/2023  9:20 PM
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
## 2023-11-04 NOTE — ED Notes (Signed)
 Assisted RN with changing pt brief, bed sheets, and gown.

## 2023-11-04 NOTE — ED Notes (Signed)
 Cardizem  drip rate increased.  Family with pt.  Afib on monitor at 154.  Pt on bipap.

## 2023-11-05 ENCOUNTER — Inpatient Hospital Stay
Admit: 2023-11-05 | Discharge: 2023-11-05 | Disposition: A | Discharge status: Home / Self Care | Attending: Internal Medicine

## 2023-11-05 DIAGNOSIS — A419 Sepsis, unspecified organism: Secondary | ICD-10-CM | POA: Diagnosis not present

## 2023-11-05 DIAGNOSIS — I5031 Acute diastolic (congestive) heart failure: Secondary | ICD-10-CM | POA: Diagnosis not present

## 2023-11-05 DIAGNOSIS — J9601 Acute respiratory failure with hypoxia: Secondary | ICD-10-CM | POA: Diagnosis not present

## 2023-11-05 DIAGNOSIS — R0902 Hypoxemia: Secondary | ICD-10-CM

## 2023-11-05 DIAGNOSIS — I4891 Unspecified atrial fibrillation: Secondary | ICD-10-CM | POA: Diagnosis not present

## 2023-11-05 DIAGNOSIS — Z7189 Other specified counseling: Secondary | ICD-10-CM | POA: Diagnosis not present

## 2023-11-05 DIAGNOSIS — I509 Heart failure, unspecified: Secondary | ICD-10-CM

## 2023-11-05 DIAGNOSIS — J441 Chronic obstructive pulmonary disease with (acute) exacerbation: Secondary | ICD-10-CM | POA: Diagnosis not present

## 2023-11-05 DIAGNOSIS — J189 Pneumonia, unspecified organism: Secondary | ICD-10-CM | POA: Diagnosis not present

## 2023-11-05 LAB — ECHOCARDIOGRAM COMPLETE
AR max vel: 2.3 cm2
AV Area VTI: 2.78 cm2
AV Area mean vel: 2.31 cm2
AV Mean grad: 5 mmHg
AV Peak grad: 8.5 mmHg
Ao pk vel: 1.46 m/s
Area-P 1/2: 2.34 cm2
Height: 66 in
MV M vel: 4.53 m/s
MV Peak grad: 82.1 mmHg
MV VTI: 1.8 cm2
S' Lateral: 3.9 cm
Single Plane A4C EF: 33.4 %
Weight: 4370.4 [oz_av]

## 2023-11-05 LAB — BASIC METABOLIC PANEL WITH GFR
Anion gap: 9 (ref 5–15)
BUN: 61 mg/dL — ABNORMAL HIGH (ref 8–23)
CO2: 23 mmol/L (ref 22–32)
Calcium: 8.6 mg/dL — ABNORMAL LOW (ref 8.9–10.3)
Chloride: 101 mmol/L (ref 98–111)
Creatinine, Ser: 1.65 mg/dL — ABNORMAL HIGH (ref 0.44–1.00)
GFR, Estimated: 32 mL/min — ABNORMAL LOW (ref >60)
Glucose, Bld: 201 mg/dL — ABNORMAL HIGH (ref 70–99)
Potassium: 3.8 mmol/L (ref 3.5–5.1)
Sodium: 133 mmol/L — ABNORMAL LOW (ref 135–145)

## 2023-11-05 LAB — URINALYSIS, W/ REFLEX TO CULTURE (INFECTION SUSPECTED)
Bilirubin Urine: NEGATIVE
Glucose, UA: NEGATIVE mg/dL
Hgb urine dipstick: NEGATIVE
Ketones, ur: NEGATIVE mg/dL
Nitrite: NEGATIVE
Protein, ur: 30 mg/dL — AB
Specific Gravity, Urine: 1.021 (ref 1.005–1.030)
pH: 5 (ref 5.0–8.0)

## 2023-11-05 LAB — LACTIC ACID, PLASMA
Lactic Acid, Venous: 2.7 mmol/L (ref 0.5–1.9)
Lactic Acid, Venous: 2 mmol/L (ref 0.5–1.9)

## 2023-11-05 LAB — BLOOD GAS, VENOUS
Acid-base deficit: 1.9 mmol/L (ref 0.0–2.0)
Bicarbonate: 24.3 mmol/L (ref 20.0–28.0)
O2 Saturation: 93.8 %
Patient temperature: 37
pCO2, Ven: 46 mmHg (ref 44–60)
pH, Ven: 7.33 (ref 7.25–7.43)
pO2, Ven: 66 mmHg — ABNORMAL HIGH (ref 32–45)

## 2023-11-05 LAB — APTT: aPTT: 36 s (ref 24–36)

## 2023-11-05 LAB — PROCALCITONIN: Procalcitonin: 3.76 ng/mL

## 2023-11-05 LAB — CBC
HCT: 33.9 % — ABNORMAL LOW (ref 36.0–46.0)
Hemoglobin: 10.9 g/dL — ABNORMAL LOW (ref 12.0–15.0)
MCH: 30.4 pg (ref 26.0–34.0)
MCHC: 32.2 g/dL (ref 30.0–36.0)
MCV: 94.4 fL (ref 80.0–100.0)
Platelets: 174 10*3/uL (ref 150–400)
RBC: 3.59 MIL/uL — ABNORMAL LOW (ref 3.87–5.11)
RDW: 13.5 % (ref 11.5–15.5)
WBC: 14.7 10*3/uL — ABNORMAL HIGH (ref 4.0–10.5)
nRBC: 0 % (ref 0.0–0.2)

## 2023-11-05 LAB — HIV ANTIBODY (ROUTINE TESTING W REFLEX): HIV Screen 4th Generation wRfx: NONREACTIVE

## 2023-11-05 LAB — OSMOLALITY: Osmolality: 298 mosm/kg — ABNORMAL HIGH (ref 275–295)

## 2023-11-05 LAB — GLUCOSE, CAPILLARY: Glucose-Capillary: 189 mg/dL — ABNORMAL HIGH (ref 70–99)

## 2023-11-05 LAB — OSMOLALITY, URINE: Osmolality, Ur: 423 mosm/kg (ref 300–900)

## 2023-11-05 LAB — HEPARIN LEVEL (UNFRACTIONATED)
Heparin Unfractionated: <0.1 [IU]/mL — ABNORMAL LOW (ref 0.30–0.70)
Heparin Unfractionated: 0.35 [IU]/mL (ref 0.30–0.70)
Heparin Unfractionated: 0.36 [IU]/mL (ref 0.30–0.70)

## 2023-11-05 LAB — SODIUM, URINE, RANDOM: Sodium, Ur: <10 mmol/L

## 2023-11-05 LAB — MRSA NEXT GEN BY PCR, NASAL: MRSA by PCR Next Gen: NOT DETECTED

## 2023-11-05 MED ORDER — ZOLPIDEM TARTRATE 5 MG PO TABS
5.0000 mg | ORAL_TABLET | Freq: Every day | ORAL | Status: DC
  Administered 2023-11-05 – 2023-11-12 (×8): 5 mg via ORAL
  Filled 2023-11-05 (×8): qty 1

## 2023-11-05 MED ORDER — VANCOMYCIN HCL 750 MG/150ML IV SOLN
750.0000 mg | INTRAVENOUS | Status: DC
  Administered 2023-11-05 – 2023-11-06 (×2): 750 mg via INTRAVENOUS
  Filled 2023-11-05 (×3): qty 150

## 2023-11-05 MED ORDER — PERFLUTREN LIPID MICROSPHERE
1.0000 mL | INTRAVENOUS | Status: AC | PRN
  Administered 2023-11-05: 3 mL via INTRAVENOUS

## 2023-11-05 MED ORDER — ORAL CARE MOUTH RINSE
15.0000 mL | OROMUCOSAL | Status: DC
  Administered 2023-11-05 – 2023-11-13 (×33): 15 mL via OROMUCOSAL

## 2023-11-05 MED ORDER — FLUOXETINE HCL 10 MG PO CAPS
10.0000 mg | ORAL_CAPSULE | Freq: Every day | ORAL | Status: DC
  Administered 2023-11-06 – 2023-11-13 (×8): 10 mg via ORAL
  Filled 2023-11-05 (×8): qty 1

## 2023-11-05 MED ORDER — VANCOMYCIN HCL 1500 MG/300ML IV SOLN
1500.0000 mg | Freq: Once | INTRAVENOUS | Status: AC
  Administered 2023-11-05: 1500 mg via INTRAVENOUS
  Filled 2023-11-05: qty 300

## 2023-11-05 MED ORDER — ORAL CARE MOUTH RINSE: 15.0000 mL | OROMUCOSAL | Status: DC | PRN

## 2023-11-05 MED ORDER — CHLORHEXIDINE GLUCONATE CLOTH 2 % EX PADS: 6.0000 | MEDICATED_PAD | Freq: Every day | CUTANEOUS | Status: DC

## 2023-11-05 MED ORDER — RISAQUAD PO CAPS
1.0000 | ORAL_CAPSULE | Freq: Two times a day (BID) | ORAL | Status: DC
  Administered 2023-11-05 – 2023-11-13 (×16): 1 via ORAL
  Filled 2023-11-05 (×17): qty 1

## 2023-11-05 MED ORDER — PANTOPRAZOLE SODIUM 40 MG PO TBEC
40.0000 mg | DELAYED_RELEASE_TABLET | Freq: Every day | ORAL | Status: DC
  Administered 2023-11-05 – 2023-11-13 (×9): 40 mg via ORAL
  Filled 2023-11-05 (×9): qty 1

## 2023-11-05 MED ORDER — MEMANTINE HCL 10 MG PO TABS
10.0000 mg | ORAL_TABLET | Freq: Two times a day (BID) | ORAL | Status: DC
  Administered 2023-11-05 – 2023-11-13 (×16): 10 mg via ORAL
  Filled 2023-11-05 (×17): qty 1

## 2023-11-05 MED ORDER — FUROSEMIDE 10 MG/ML IJ SOLN
20.0000 mg | Freq: Once | INTRAMUSCULAR | Status: AC
  Administered 2023-11-05: 20 mg via INTRAVENOUS
  Filled 2023-11-05: qty 2

## 2023-11-05 MED ORDER — MELATONIN 5 MG PO TABS
5.0000 mg | ORAL_TABLET | Freq: Every day | ORAL | Status: DC
  Administered 2023-11-05 – 2023-11-12 (×8): 5 mg via ORAL
  Filled 2023-11-05 (×8): qty 1

## 2023-11-05 MED ORDER — GABAPENTIN 100 MG PO CAPS
100.0000 mg | ORAL_CAPSULE | Freq: Two times a day (BID) | ORAL | Status: DC
  Administered 2023-11-05 – 2023-11-13 (×16): 100 mg via ORAL
  Filled 2023-11-05 (×16): qty 1

## 2023-11-05 MED ORDER — UMECLIDINIUM-VILANTEROL 62.5-25 MCG/ACT IN AEPB
1.0000 | INHALATION_SPRAY | Freq: Every day | RESPIRATORY_TRACT | Status: DC
  Administered 2023-11-05 – 2023-11-13 (×9): 1 via RESPIRATORY_TRACT
  Filled 2023-11-05 (×2): qty 14

## 2023-11-05 MED ORDER — SODIUM CHLORIDE 0.9 % IV SOLN
2.0000 g | Freq: Two times a day (BID) | INTRAVENOUS | Status: DC
  Administered 2023-11-05 – 2023-11-07 (×5): 2 g via INTRAVENOUS
  Filled 2023-11-05 (×5): qty 12.5

## 2023-11-05 MED ORDER — LATANOPROST 0.005 % OP SOLN
1.0000 [drp] | Freq: Every day | OPHTHALMIC | Status: DC
  Administered 2023-11-05 – 2023-11-12 (×8): 1 [drp] via OPHTHALMIC
  Filled 2023-11-05 (×2): qty 2.5

## 2023-11-05 MED ORDER — CHLORHEXIDINE GLUCONATE CLOTH 2 % EX PADS
6.0000 | MEDICATED_PAD | Freq: Every day | CUTANEOUS | Status: DC
  Administered 2023-11-06 – 2023-11-09 (×4): 6 via TOPICAL

## 2023-11-05 NOTE — Progress Notes (Signed)
 PT Cancellation Note  Patient Details Name: Rachel [name] MRN: [id number] DOB: Sep 17, 1945   Cancelled Treatment:    Reason Eval/Treat Not Completed: Medical issues which prohibited therapy (Attempted in the morning and afternoon. Spoke with nurse and OT. Patient remains on BiPAP at this time. Will follow up next date or as appropriate.)  Randine Essex, PT, MPT  Randine LULLA Essex 11/05/2023, 1:40 PM

## 2023-11-05 NOTE — ED Notes (Signed)
Pt to icu with rn and rt.

## 2023-11-05 NOTE — Progress Notes (Signed)
 Initial Nutrition Assessment  DOCUMENTATION CODES:   Morbid obesity  INTERVENTION:   -RD will follow for diet advancement and add supplements as appropriate -RD provided Low Sodium Nutrition Therapy handout from [facility]' attached to AVS/ discharge summary   NUTRITION DIAGNOSIS:   Increased nutrient needs related to acute illness as evidenced by estimated needs.  GOAL:   Patient will meet greater than or equal to 90% of their needs  MONITOR:   PO intake, Supplement acceptance, Diet advancement  REASON FOR ASSESSMENT:   Consult Assessment of nutrition requirement/status  ASSESSMENT:   Pt with medical history significant for COPD and chronic respiratory failure on home O2 at 3 to 4 L, HTN, morbid obesity, history of cochlear implants being admitted with multiple acute medical issues including worsening respiratory failure secondary to COPD and CHF, pneumonia and new onset A-fib with RVR  Pt admitted with acute on chronic respiratory failure with hypoxemia secondary to CAP/ sepsis, CHF, and a-fib.   Reviewed I/O's: +2.6 L x 24 hours   UOP: 600 ml x 24 hours  Pt lying in bed, NPO, on Bi-pap at time of visit, Pt requested to help pt with water swab provided by RN. RD provided pt swab with water per her request.   Pt unable to provide additional history at this time. No family present to provide additional history.   No wt loss noted over the past 2 years.   Medications reviewed and include lasix  and cardizem .   Obesity is a complex, chronic medical condition that is optimally managed by a multidisciplinary care team. Weight loss is not an ideal goal for an acute inpatient hospitalization. However, if further work-up for obesity is warranted, consider outpatient referral to [facility].    Labs reviewed: Na: 133, CBGS: 189 (inpatient orders for glycemic control are none).    NUTRITION - FOCUSED PHYSICAL  EXAM:  Flowsheet Row Most Recent Value  Orbital Region No depletion  Upper Arm Region No depletion  Thoracic and Lumbar Region No depletion  Buccal Region No depletion  Temple Region No depletion  Clavicle Bone Region No depletion  Clavicle and Acromion Bone Region No depletion  Scapular Bone Region No depletion  Dorsal Hand No depletion  Patellar Region No depletion  Anterior Thigh Region No depletion  Posterior Calf Region No depletion  Edema (RD Assessment) Moderate  Hair Reviewed  Eyes Reviewed  Mouth Reviewed  Skin Reviewed  Nails Reviewed    Diet Order:   Diet Order             Diet NPO time specified  Diet effective now                   EDUCATION NEEDS:   No education needs have been identified at this time  Skin:  Skin Assessment: Reviewed RN Assessment  Last BM:  Unknown  Height:   Ht Readings from Last 1 Encounters:  11/05/23 5' 6 (1.676 m)    Weight:   Wt Readings from Last 1 Encounters:  11/05/23 123.9 kg    Ideal Body Weight:  59.1 kg  BMI:  Body mass index is 44.09 kg/m.  Estimated Nutritional Needs:   Kcal:  1750-1950  Protein:  90-105 grams  Fluid:  1.7-1.9 L    Margery ORN, RD, LDN, CDCES Registered Dietitian III Certified Diabetes Care and Education Specialist If unable to reach this RD, please use RD Inpatient group chat on secure chat between  hours of 8am-4 pm daily
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
## 2023-11-05 NOTE — Progress Notes (Signed)
 PROGRESS NOTE    Rachel [name]  FMW:[id number] DOB: [birth year] DOA: 11/04/2023 PCP: Corlis Honor [name], MD    Brief Narrative:  78 y.o. female with medical history significant for COPD and chronic respiratory failure on home O2 at 3 to 4 L, HTN, morbid obesity, history of cochlear implants being admitted with multiple acute medical issues including worsening respiratory failure secondary to COPD and CHF, pneumonia and new onset A-fib with RVR.  She is currently on BiPAP.  She presented by EMS with CPAP in place after they received a call for acute respiratory distress and a fever.  With EMS she received IV Tylenol , Solu-Medrol , DuoNebs and an IV diltiazem  push.    Assessment & Plan:   Principal Problem:   Acute respiratory failure with hypoxia (HCC) Active Problems:   Acute on chronic respiratory failure with hypoxemia (HCC)   CAP (community acquired pneumonia)   Sepsis (HCC)   COPD exacerbation (HCC)   Atrial fibrillation with rapid ventricular response, new onset (HCC)   Acute on chronic diastolic CHF (congestive heart failure) (HCC)   Hyponatremia   Essential hypertension   Obesity, Class III, BMI 40-49.9 (morbid obesity)   Acute on chronic congestive heart failure (HCC)   Hypoxia  Acute on chronic respiratory failure with hypoxemia (HCC) Secondary to CAP/sepsis, and CHF likely triggered by rapid A-fib PE among the differentials but lower suspicion.  Renal function precludes CTA. Plan:  Continue BiPAP, wean as tolerated Continue stepdown status for now   CAP (community acquired pneumonia) Sepsis COPD exacerbation Sepsis criteria include fever, tachycardia and tachypnea with leukocytosis and lactic acidosis, airspace disease on chest x-ray Received sepsis fluid bolus in the ED Plan: Continue antibiotics Hold maintenance fluids Continue bronchodilators Hold IV steroids for now Monitor vitals and fever curve  Atrial fibrillation with rapid ventricular response, new  onset (HCC) Heart rate improved.  Remains on diltiazem  infusion. Plan: Continue IV diltiazem , wean as tolerated, goal heart rate <110 Continue IV heparin If able to reliably stay off of NIPPV can consider transition to p.o. diltiazem    Acute on chronic diastolic CHF (congestive heart failure) (HCC) BNP mildly elevated.  Chest x-ray with edema.  Received a dose of IV Lasix  in ED. Plan: IV Lasix  Cardiology consulted   Hyponatremia Suspect hypervolemic hyponatremia in the setting of decompensated heart failure.  IV diuresis as above.  Daily renal function.   Obesity, Class III, BMI 40-49.9 (morbid obesity) This is a complicating factor in overall care and prognosis   Essential hypertension GTT   DVT prophylaxis: IV heparin Code Status: DNR Family Communication: Sister via phone 6/25 Disposition Plan: Status is: Inpatient Remains inpatient appropriate because: Multiple acute issues as above   Level of care: Stepdown  Consultants:  Cardiology-[facility]  Procedures:  None  Antimicrobials: Rocephin  Azithromycin    Subjective: Seen and examined.  On BiPAP.  Endorses thirst.  Answers questions appropriately.  Objective: Vitals:   11/05/23 1100 11/05/23 1200 11/05/23 1300 11/05/23 1427  BP: (!) 114/58 123/82 126/65   Pulse: 79 80 78   Resp: (!) 22 (!) 27 (!) 22   Temp:      TempSrc:      SpO2: 95% 93% 95% 95%  Weight:      Height:        Intake/Output Summary (Last 24 hours) at 11/05/2023 1441 Last data filed at 11/05/2023 1348 Gross per 24 hour  Intake 3731.66 ml  Output 1300 ml  Net 2431.66 ml   Filed Weights   11/04/23  2100 11/05/23 0030 11/05/23 0403  Weight: 121 kg 124.9 kg 123.9 kg    Examination:  General exam: Appears fatigued Respiratory system: Scattered crackles.  No wheeze.  No work of breathing.  BiPAP Cardiovascular system: S1-S2, tachycardic, irregular rhythm, no murmurs Gastrointestinal system: Soft, NT/ND, normal bowel sounds Central nervous  system: Alert and oriented. No focal neurological deficits. Extremities: Symmetric 5 x 5 power. Skin: No rashes, lesions or ulcers Psychiatry: Judgement and insight appear normal. Mood & affect appropriate.     Data Reviewed: I have personally reviewed following labs and imaging studies  CBC: Recent Labs  Lab 11/04/23 2114 11/05/23 0326  WBC 17.1* 14.7*  NEUTROABS 14.1*  --   HGB 12.0 10.9*  HCT 36.5 33.9*  MCV 94.6 94.4  PLT 217 174   Basic Metabolic Panel: Recent Labs  Lab 11/04/23 2114 11/05/23 0326  NA 129* 133*  K 3.8 3.8  CL 95* 101  CO2 21* 23  GLUCOSE 160* 201*  BUN 58* 61*  CREATININE 1.62* 1.65*  CALCIUM  9.1 8.6*   GFR: Estimated Creatinine Clearance: 37.8 mL/min (A) (by C-G formula based on SCr of 1.65 mg/dL (H)). Liver Function Tests: Recent Labs  Lab 11/04/23 2114  AST 23  ALT 14  ALKPHOS 67  BILITOT 1.1  PROT 7.8  ALBUMIN 3.3*   No results for input(s): LIPASE, AMYLASE in the last 168 hours. No results for input(s): AMMONIA in the last 168 hours. Coagulation Profile: Recent Labs  Lab 11/04/23 2114  INR 1.3*   Cardiac Enzymes: No results for input(s): CKTOTAL, CKMB, CKMBINDEX, TROPONINI in the last 168 hours. BNP (last 3 results) No results for input(s): PROBNP in the last 8760 hours. HbA1C: No results for input(s): HGBA1C in the last 72 hours. CBG: Recent Labs  Lab 11/05/23 0027  GLUCAP 189*   Lipid Profile: No results for input(s): CHOL, HDL, LDLCALC, TRIG, CHOLHDL, LDLDIRECT in the last 72 hours. Thyroid Function Tests: No results for input(s): TSH, T4TOTAL, FREET4, T3FREE, THYROIDAB in the last 72 hours. Anemia Panel: No results for input(s): VITAMINB12, FOLATE, FERRITIN, TIBC, IRON, RETICCTPCT in the last 72 hours. Sepsis Labs: Recent Labs  Lab 11/04/23 2114 11/04/23 2300 11/05/23 0146 11/05/23 0326  PROCALCITON  --  3.76  --   --   LATICACIDVEN 2.6* 2.7* 2.7* 2.0*     Recent Results (from the past 240 hours)  Resp panel by RT-PCR (RSV, Flu A&B, Covid) Anterior Nasal Swab     Status: None   Collection Time: 11/04/23  9:14 PM   Specimen: Anterior Nasal Swab  Result Value Ref Range Status   SARS Coronavirus 2 by RT PCR NEGATIVE NEGATIVE Final    Comment: (NOTE) SARS-CoV-2 target nucleic acids are NOT DETECTED.  The SARS-CoV-2 RNA is generally detectable in upper respiratory specimens during the acute phase of infection. The lowest concentration of SARS-CoV-2 viral copies this assay can detect is 138 copies/mL. A negative result does not preclude SARS-Cov-2 infection and should not be used as the sole basis for treatment or other patient management decisions. A negative result may occur with  improper specimen collection/handling, submission of specimen other than nasopharyngeal swab, presence of viral mutation(s) within the areas targeted by this assay, and inadequate number of viral copies(<138 copies/mL). A negative result must be combined with clinical observations, patient history, and epidemiological information. The expected result is Negative.  Fact Sheet for Patients:  BloggerCourse.com  Fact Sheet for Healthcare Providers:  SeriousBroker.it  This test is no t yet  approved or cleared by the [place]  FDA and  has been authorized for detection and/or diagnosis of SARS-CoV-2 by FDA under an Emergency Use Authorization (EUA). This EUA will remain  in effect (meaning this test can be used) for the duration of the COVID-19 declaration under Section 564(b)(1) of the Act, 21 U.S.C.section 360bbb-3(b)(1), unless the authorization is terminated  or revoked sooner.       Influenza A by PCR NEGATIVE NEGATIVE Final   Influenza B by PCR NEGATIVE NEGATIVE Final    Comment: (NOTE) The Xpert Xpress SARS-CoV-2/FLU/RSV plus assay is intended as an aid in the diagnosis of influenza from  Nasopharyngeal swab specimens and should not be used as a sole basis for treatment. Nasal washings and aspirates are unacceptable for Xpert Xpress SARS-CoV-2/FLU/RSV testing.  Fact Sheet for Patients: BloggerCourse.com  Fact Sheet for Healthcare Providers: SeriousBroker.it  This test is not yet approved or cleared by the [place]  FDA and has been authorized for detection and/or diagnosis of SARS-CoV-2 by FDA under an Emergency Use Authorization (EUA). This EUA will remain in effect (meaning this test can be used) for the duration of the COVID-19 declaration under Section 564(b)(1) of the Act, 21 U.S.C. section 360bbb-3(b)(1), unless the authorization is terminated or revoked.     Resp Syncytial Virus by PCR NEGATIVE NEGATIVE Final    Comment: (NOTE) Fact Sheet for Patients: BloggerCourse.com  Fact Sheet for Healthcare Providers: SeriousBroker.it  This test is not yet approved or cleared by the [place]  FDA and has been authorized for detection and/or diagnosis of SARS-CoV-2 by FDA under an Emergency Use Authorization (EUA). This EUA will remain in effect (meaning this test can be used) for the duration of the COVID-19 declaration under Section 564(b)(1) of the Act, 21 U.S.C. section 360bbb-3(b)(1), unless the authorization is terminated or revoked.  Performed at [facility], [address] [place], [place] [address]   Blood Culture (routine x 2)     Status: None (Preliminary result)   Collection Time: 11/04/23  9:14 PM   Specimen: BLOOD  Result Value Ref Range Status   Specimen Description   Final    BLOOD BLOOD RIGHT ARM Performed at [facility], [address] [place], [place] [address]    Special Requests   Final    BOTTLES DRAWN AEROBIC AND ANAEROBIC Blood Culture adequate volume Performed at [facility], [address], [place], [place] [address]    Culture   Final    NO GROWTH < 24 HOURS Performed at [facility], [address] [place], [place] [address]    Report Status PENDING  Incomplete  Blood Culture (routine x 2)     Status: None (Preliminary result)   Collection Time: 11/04/23  9:15 PM   Specimen: BLOOD  Result Value Ref Range Status   Specimen Description   Final    BLOOD BLOOD LEFT ARM Performed at [facility], [address] [place], [place] [address]    Special Requests   Final    BOTTLES DRAWN AEROBIC AND ANAEROBIC Blood Culture results may not be optimal due to an inadequate volume of blood received in culture bottles Performed at [facility], [address], [place], [place] [address]    Culture   Final    NO GROWTH < 24 HOURS Performed at [facility], [address], [place], [place] [address]    Report Status PENDING  Incomplete  MRSA Next Gen by PCR,  Nasal     Status: None   Collection Time: 11/05/23  1:04 AM   Specimen: Nasal Mucosa; Nasal Swab  Result Value Ref Range Status   MRSA by PCR Next Gen NOT DETECTED NOT DETECTED Final    Comment: (NOTE) The GeneXpert MRSA Assay (FDA approved for NASAL specimens only), is one component of a comprehensive MRSA colonization surveillance program. It is not intended to diagnose MRSA infection nor to guide or monitor treatment for MRSA infections. Test performance is not FDA approved in patients less than 27 years old. Performed at [facility], [address] [place], [place] [address]          Radiology Studies: ECHOCARDIOGRAM COMPLETE Result Date: 11/05/2023    ECHOCARDIOGRAM REPORT   Patient Name:   TRISHNA CWIK Date of Exam: 11/05/2023 Medical Rec #:  [id number]          Height:       66.0 in Accession #:    [id number]         Weight:       273.1 lb Date of Birth:  [birth year]         BSA:          2.283 m Patient Age:    9 years           BP:           115/63 mmHg Patient  Gender: F                  HR:           99 bpm. Exam Location:  [facility] Procedure: 2D Echo, Cardiac Doppler, Color Doppler and Intracardiac            Opacification Agent (Both Spectral and Color Flow Doppler were            utilized during procedure). Indications:     CHF-Acute Diastolic I50.31  History:         Patient has prior history of Echocardiogram examinations, most                  recent 11/16/2021. CHF.  Sonographer:     Rosina Dunk Referring Phys:  [id number] DELAYNE LULLA SOLIAN Diagnosing Phys: Evalene Lunger MD IMPRESSIONS  1. Left ventricular ejection fraction, by estimation, is 60 to 65%. The left ventricle has normal function. The left ventricle has no regional wall motion abnormalities. Left ventricular diastolic parameters are indeterminate.  2. Right ventricular systolic function is normal. The right ventricular size is normal. There is normal pulmonary artery systolic pressure. The estimated right ventricular systolic pressure is 27.8 mmHg.  3. The mitral valve is normal in structure. No evidence of mitral valve regurgitation. No evidence of mitral stenosis.  4. The aortic valve is normal in structure. Aortic valve regurgitation is not visualized. No aortic stenosis is present.  5. The inferior vena cava is normal in size with greater than 50% respiratory variability, suggesting right atrial pressure of 3 mmHg. FINDINGS  Left Ventricle: Left ventricular ejection fraction, by estimation, is 60 to 65%. The left ventricle has normal function. The left ventricle has no regional wall motion abnormalities. Definity  contrast agent was given IV to delineate the left ventricular  endocardial borders. Strain was performed and the global longitudinal strain is indeterminate. The left ventricular internal cavity size was normal in size. There is no left ventricular hypertrophy. Left ventricular diastolic parameters are indeterminate. Right Ventricle: The right ventricular size is normal. No  increase in right  ventricular wall thickness. Right ventricular systolic function is normal. There is normal pulmonary artery systolic pressure. The tricuspid regurgitant velocity is 2.39 m/s, and  with an assumed right atrial pressure of 5 mmHg, the estimated right ventricular systolic pressure is 27.8 mmHg. Left Atrium: Left atrial size was normal in size. Right Atrium: Right atrial size was normal in size. Pericardium: There is no evidence of pericardial effusion. Mitral Valve: The mitral valve is normal in structure. There is mild calcification of the mitral valve leaflet(s). No evidence of mitral valve regurgitation. No evidence of mitral valve stenosis. MV peak gradient, 9.4 mmHg. The mean mitral valve gradient  is 4.0 mmHg. Tricuspid Valve: The tricuspid valve is normal in structure. Tricuspid valve regurgitation is not demonstrated. No evidence of tricuspid stenosis. Aortic Valve: The aortic valve is normal in structure. Aortic valve regurgitation is not visualized. No aortic stenosis is present. Aortic valve mean gradient measures 5.0 mmHg. Aortic valve peak gradient measures 8.5 mmHg. Aortic valve area, by VTI measures 2.78 cm. Pulmonic Valve: The pulmonic valve was normal in structure. Pulmonic valve regurgitation is not visualized. No evidence of pulmonic stenosis. Aorta: The aortic root is normal in size and structure. Venous: The inferior vena cava is normal in size with greater than 50% respiratory variability, suggesting right atrial pressure of 3 mmHg. IAS/Shunts: No atrial level shunt detected by color flow Doppler. Additional Comments: 3D was performed not requiring image post processing on an independent workstation and was indeterminate.  LEFT VENTRICLE PLAX 2D LVIDd:         4.95 cm     Diastology LVIDs:         3.90 cm     LV e' medial:    8.27 cm/s LV PW:         1.25 cm     LV E/e' medial:  19.1 LV IVS:        1.05 cm     LV e' lateral:   13.70 cm/s LVOT diam:     2.10 cm     LV E/e' lateral: 11.5 LV SV:          57 LV SV Index:   25 LVOT Area:     3.46 cm  LV Volumes (MOD) LV vol d, MOD A4C: 51.2 ml LV vol s, MOD A4C: 34.1 ml LV SV MOD A4C:     51.2 ml RIGHT VENTRICLE RV Basal diam:  4.30 cm RV Mid diam:    3.30 cm RV S prime:     17.10 cm/s TAPSE (M-mode): 1.8 cm LEFT ATRIUM             Index        RIGHT ATRIUM           Index LA Vol (A2C):   52.2 ml 22.86 ml/m  RA Area:     17.80 cm LA Vol (A4C):   60.6 ml 26.54 ml/m  RA Volume:   50.10 ml  21.94 ml/m LA Biplane Vol: 62.4 ml 27.33 ml/m  AORTIC VALVE AV Area (Vmax):    2.30 cm AV Area (Vmean):   2.31 cm AV Area (VTI):     2.78 cm AV Vmax:           146.00 cm/s AV Vmean:          98.500 cm/s AV VTI:            0.207 m AV Peak Grad:  8.5 mmHg AV Mean Grad:      5.0 mmHg LVOT Vmax:         96.90 cm/s LVOT Vmean:        65.700 cm/s LVOT VTI:          0.166 m LVOT/AV VTI ratio: 0.80  AORTA Ao Root diam: 3.30 cm MITRAL VALVE                TRICUSPID VALVE MV Area (PHT): 2.34 cm     TR Peak grad:   22.8 mmHg MV Area VTI:   1.80 cm     TR Mean grad:   17.0 mmHg MV Peak grad:  9.4 mmHg     TR Vmax:        239.00 cm/s MV Mean grad:  4.0 mmHg     TR Vmean:       202.0 cm/s MV Vmax:       1.53 m/s MV Vmean:      94.9 cm/s    SHUNTS MV Decel Time: 324 msec     Systemic VTI:  0.17 m MR Peak grad: 82.1 mmHg     Systemic Diam: 2.10 cm MR Vmax:      453.00 cm/s MV E velocity: 158.00 cm/s Evalene Lunger MD Electronically signed by Evalene Lunger MD Signature Date/Time: 11/05/2023/2:40:23 PM    Final    DG Chest Port 1 View Result Date: 11/04/2023 CLINICAL DATA:  Questionable sepsis - evaluate for abnormality EXAM: PORTABLE CHEST 1 VIEW COMPARISON:  11/16/2021 FINDINGS: Heart and mediastinal contours within normal limits. Aortic atherosclerosis. Bilateral lower lobe airspace opacities. No effusions. No acute bony abnormality. IMPRESSION: Bilateral lower lobe airspace opacities could reflect edema or infection. Electronically Signed   By: Franky Crease M.D.   On:  11/04/2023 21:36        Scheduled Meds:  Chlorhexidine Gluconate Cloth  6 each Topical Daily   furosemide   20 mg Intravenous Q12H   ipratropium-albuterol   3 mL Nebulization Q6H   mouth rinse  15 mL Mouth Rinse 4 times per day   Continuous Infusions:  ceFEPime (MAXIPIME) IV Stopped (11/05/23 1034)   diltiazem  (CARDIZEM ) infusion 15 mg/hr (11/05/23 1347)   heparin 1,250 Units/hr (11/05/23 1300)   vancomycin        LOS: 1 day   CRITICAL CARE Performed by: Calvin KATHEE Robson   Total critical care time: 40 minutes  Critical care time was exclusive of separately billable procedures and treating other patients.  Critical care was necessary to treat or prevent imminent or life-threatening deterioration.  Critical care was time spent personally by me on the following activities: development of treatment plan with patient and/or surrogate as well as nursing, discussions with consultants, evaluation of patient's response to treatment, examination of patient, obtaining history from patient or surrogate, ordering and performing treatments and interventions, ordering and review of laboratory studies, ordering and review of radiographic studies, pulse oximetry and re-evaluation of patient's condition.   Calvin KATHEE Robson, MD [facility]   If 7PM-7AM, please contact night-coverage  11/05/2023, 2:41 PM

## 2023-11-05 NOTE — Discharge Instructions (Signed)

## 2023-11-05 NOTE — Progress Notes (Signed)
 Pharmacy Antibiotic Note  Rachel [name] is a 78 y.o. female admitted on 11/04/2023 with pneumonia/HCAP.  Pharmacy has been consulted for Vancomycin  dosing for 7 days.  Plan: Pt given Vancomycin  1000 mg once. Pt ordered additional Vancomycin  1500 mg once. Vancomycin  750 mg IV Q 24 hrs. Goal AUC 400-550. Expected AUC: 465.4 SCr used: 1.62, Vd used: 0.5, BMI: 43  Pharmacy will continue to follow and will adjust abx dosing whenever warranted.  Temp (24hrs), Avg:100.8 F (38.2 C), Min:100.8 F (38.2 C), Max:100.8 F (38.2 C)   Recent Labs  Lab 11/04/23 2114 11/04/23 2300  WBC 17.1*  --   CREATININE 1.62*  --   LATICACIDVEN 2.6* 2.7*    Estimated Creatinine Clearance: 38 mL/min (A) (by C-G formula based on SCr of 1.62 mg/dL (H)).    No Known Allergies  Antimicrobials this admission: 6/24 Cefepime >>  6/24 Vancomycin  >>   Microbiology results: 6/24 BCx: Pending  Thank you for allowing pharmacy to be a part of this patient's care.  Rankin CANDIE Dills, PharmD, [facility] 11/05/2023 12:13 AM

## 2023-11-05 NOTE — Progress Notes (Signed)
 Per pt request I have contacted patient's sister and asked her to bring her hearing aid and her I-pad. Sister states she will go to [facility] and get those items and bring to hospital. Per sister, she does not actually have a hearing aid, but a cochlear implant amplifier.

## 2023-11-05 NOTE — Sepsis Progress Note (Signed)
Notified bedside nurse of need to order repeat lactic acid.  

## 2023-11-05 NOTE — Progress Notes (Signed)
 OT Cancellation Note  Patient Details Name: VERNEE BAINES MRN: [id number] DOB: 21-Sep-1945   Cancelled Treatment:    Reason Eval/Treat Not Completed: Medical issues which prohibited therapy. Order received, chart reviewed. Attempted eval in AM with nurse recommending hold at this time as pt remains on bipap. Attempted again in afternoon and pt still on bipap at this time. Will re-attempt tomorrow as medically stable.  Maxemiliano Riel E Sundee Garland 11/05/2023, 1:42 PM
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
## 2023-11-05 NOTE — Progress Notes (Signed)
 Transported pt to ICU via [facility] [place]. Placed pt back on BIPAP unit with no issues.

## 2023-11-05 NOTE — Plan of Care (Signed)
 Pt currently off bipap with sats 88-91%; given 2 doses of IV lasix  20 mg today; continues to diurese for total 800 cc out this shift. Non-productive cough noted and does complain of mild SOB ; afebrile; foley in place. Afib with HR in 90's, on cardizem  drip at 15 mg per hour as well as heparin infusion at 1250 units per hour. Also complained of nausea, treated with Zofran  with relief. PT unable to work with patient today due due to being on bipap. NPO status continued for now.   Problem: Education: Goal: Knowledge of General Education information will improve Description: Including pain rating scale, medication(s)/side effects and non-pharmacologic comfort measures Outcome: Progressing   Problem: Health Behavior/Discharge Planning: Goal: Ability to manage health-related needs will improve Outcome: Progressing   Problem: Clinical Measurements: Goal: Ability to maintain clinical measurements within normal limits will improve Outcome: Progressing Goal: Will remain free from infection Outcome: Progressing Goal: Diagnostic test results will improve Outcome: Progressing Goal: Respiratory complications will improve Outcome: Progressing Goal: Cardiovascular complication will be avoided Outcome: Progressing   Problem: Activity: Goal: Risk for activity intolerance will decrease Outcome: Progressing   Problem: Nutrition: Goal: Adequate nutrition will be maintained Outcome: Not Progressing   Problem: Coping: Goal: Level of anxiety will decrease Outcome: Progressing   Problem: Elimination: Goal: Will not experience complications related to bowel motility Outcome: Progressing Goal: Will not experience complications related to urinary retention Outcome: Progressing   Problem: Pain Managment: Goal: General experience of comfort will improve and/or be controlled Outcome: Progressing   Problem: Safety: Goal: Ability to remain free from injury will improve Outcome: Progressing    Problem: Skin Integrity: Goal: Risk for impaired skin integrity will decrease Outcome: Progressing   Problem: Education: Goal: Knowledge of disease or condition will improve Outcome: Progressing Goal: Knowledge of the prescribed therapeutic regimen will improve Outcome: Progressing   Problem: Activity: Goal: Ability to tolerate increased activity will improve Outcome: Progressing Goal: Will verbalize the importance of balancing activity with adequate rest periods Outcome: Progressing   Problem: Respiratory: Goal: Ability to maintain a clear airway will improve Outcome: Progressing Goal: Levels of oxygenation will improve Outcome: Progressing Goal: Ability to maintain adequate ventilation will improve Outcome: Progressing   Problem: Respiratory: Goal: Ability to maintain a clear airway will improve Outcome: Progressing Goal: Levels of oxygenation will improve Outcome: Progressing Goal: Ability to maintain adequate ventilation will improve Outcome: Progressing   Problem: Education: Goal: Knowledge of disease or condition will improve Outcome: Progressing Goal: Understanding of medication regimen will improve Outcome: Progressing   Problem: Cardiac: Goal: Ability to achieve and maintain adequate cardiopulmonary perfusion will improve Outcome: Progressing   Problem: Fluid Volume: Goal: Hemodynamic stability will improve Outcome: Progressing   Problem: Clinical Measurements: Goal: Diagnostic test results will improve Outcome: Progressing Goal: Signs and symptoms of infection will decrease Outcome: Progressing   Problem: Respiratory: Goal: Ability to maintain adequate ventilation will improve Outcome: Progressing   Problem: Education: Goal: Ability to demonstrate management of disease process will improve Outcome: Progressing Goal: Ability to verbalize understanding of medication therapies will improve Outcome: Progressing

## 2023-11-06 ENCOUNTER — Telehealth (HOSPITAL_COMMUNITY): Payer: Self-pay | Admitting: Pharmacy Technician

## 2023-11-06 ENCOUNTER — Other Ambulatory Visit (HOSPITAL_COMMUNITY): Payer: Self-pay

## 2023-11-06 DIAGNOSIS — I4891 Unspecified atrial fibrillation: Secondary | ICD-10-CM | POA: Diagnosis not present

## 2023-11-06 DIAGNOSIS — J9601 Acute respiratory failure with hypoxia: Secondary | ICD-10-CM | POA: Diagnosis not present

## 2023-11-06 DIAGNOSIS — J189 Pneumonia, unspecified organism: Secondary | ICD-10-CM | POA: Diagnosis not present

## 2023-11-06 DIAGNOSIS — Z7189 Other specified counseling: Secondary | ICD-10-CM | POA: Diagnosis not present

## 2023-11-06 DIAGNOSIS — J441 Chronic obstructive pulmonary disease with (acute) exacerbation: Secondary | ICD-10-CM | POA: Diagnosis not present

## 2023-11-06 DIAGNOSIS — A419 Sepsis, unspecified organism: Secondary | ICD-10-CM | POA: Diagnosis not present

## 2023-11-06 LAB — BASIC METABOLIC PANEL WITH GFR
Anion gap: 11 (ref 5–15)
BUN: 73 mg/dL — ABNORMAL HIGH (ref 8–23)
CO2: 24 mmol/L (ref 22–32)
Calcium: 8.9 mg/dL (ref 8.9–10.3)
Chloride: 100 mmol/L (ref 98–111)
Creatinine, Ser: 1.66 mg/dL — ABNORMAL HIGH (ref 0.44–1.00)
GFR, Estimated: 31 mL/min — ABNORMAL LOW (ref >60)
Glucose, Bld: 137 mg/dL — ABNORMAL HIGH (ref 70–99)
Potassium: 3.4 mmol/L — ABNORMAL LOW (ref 3.5–5.1)
Sodium: 135 mmol/L (ref 135–145)

## 2023-11-06 LAB — CBC
HCT: 32.1 % — ABNORMAL LOW (ref 36.0–46.0)
Hemoglobin: 10.4 g/dL — ABNORMAL LOW (ref 12.0–15.0)
MCH: 30.4 pg (ref 26.0–34.0)
MCHC: 32.4 g/dL (ref 30.0–36.0)
MCV: 93.9 fL (ref 80.0–100.0)
Platelets: 194 10*3/uL (ref 150–400)
RBC: 3.42 MIL/uL — ABNORMAL LOW (ref 3.87–5.11)
RDW: 13.6 % (ref 11.5–15.5)
WBC: 13.3 10*3/uL — ABNORMAL HIGH (ref 4.0–10.5)
nRBC: 0 % (ref 0.0–0.2)

## 2023-11-06 LAB — HEPARIN LEVEL (UNFRACTIONATED): Heparin Unfractionated: 0.31 [IU]/mL (ref 0.30–0.70)

## 2023-11-06 LAB — MAGNESIUM: Magnesium: 2.3 mg/dL (ref 1.7–2.4)

## 2023-11-06 LAB — CULTURE, BLOOD (ROUTINE X 2)

## 2023-11-06 MED ORDER — POTASSIUM CHLORIDE CRYS ER 20 MEQ PO TBCR
40.0000 meq | EXTENDED_RELEASE_TABLET | ORAL | Status: AC
  Administered 2023-11-06 (×2): 40 meq via ORAL
  Filled 2023-11-06: qty 2

## 2023-11-06 MED ORDER — ADULT MULTIVITAMIN W/MINERALS CH
1.0000 | ORAL_TABLET | Freq: Every day | ORAL | Status: DC
  Administered 2023-11-06 – 2023-11-13 (×8): 1 via ORAL
  Filled 2023-11-06 (×8): qty 1

## 2023-11-06 MED ORDER — ADULT MULTIVITAMIN W/MINERALS CH: 1.0000 | ORAL_TABLET | Freq: Every day | ORAL | Status: DC

## 2023-11-06 MED ORDER — DIGOXIN 0.25 MG/ML IJ SOLN
0.2500 mg | Freq: Once | INTRAMUSCULAR | Status: AC
  Administered 2023-11-06: 0.25 mg via INTRAVENOUS
  Filled 2023-11-06: qty 2

## 2023-11-06 MED ORDER — ALUM & MAG HYDROXIDE-SIMETH 200-200-20 MG/5ML PO SUSP
30.0000 mL | ORAL | Status: DC | PRN
  Administered 2023-11-06 – 2023-11-13 (×2): 30 mL via ORAL
  Filled 2023-11-06 (×2): qty 30

## 2023-11-06 MED ORDER — FUROSEMIDE 10 MG/ML IJ SOLN
40.0000 mg | Freq: Two times a day (BID) | INTRAMUSCULAR | Status: DC
  Administered 2023-11-06 – 2023-11-07 (×3): 40 mg via INTRAVENOUS
  Filled 2023-11-06 (×4): qty 4

## 2023-11-06 NOTE — Progress Notes (Signed)
 PROGRESS NOTE    DOLOREZ [name]  FMW:[id number] DOB: 1945/08/25 DOA: 11/04/2023 PCP: Corlis Honor [name], MD    Brief Narrative:  78 y.o. female with medical history significant for COPD and chronic respiratory failure on home O2 at 3 to 4 L, HTN, morbid obesity, history of cochlear implants being admitted with multiple acute medical issues including worsening respiratory failure secondary to COPD and CHF, pneumonia and new onset A-fib with RVR.  She is currently on BiPAP.  She presented by EMS with CPAP in place after they received a call for acute respiratory distress and a fever.  With EMS she received IV Tylenol , Solu-Medrol , DuoNebs and an IV diltiazem  push.    Assessment & Plan:   Principal Problem:   Acute respiratory failure with hypoxia (HCC) Active Problems:   Acute on chronic respiratory failure with hypoxemia (HCC)   CAP (community acquired pneumonia)   Sepsis (HCC)   COPD exacerbation (HCC)   Atrial fibrillation with rapid ventricular response, new onset (HCC)   Acute on chronic diastolic CHF (congestive heart failure) (HCC)   Hyponatremia   Essential hypertension   Obesity, Class III, BMI 40-49.9 (morbid obesity)   Acute on chronic congestive heart failure (HCC)   Hypoxia  Acute on chronic respiratory failure with hypoxemia (HCC) Secondary to CAP/sepsis, and CHF likely triggered by rapid A-fib PE among the differentials but lower suspicion.  Renal function precludes CTA. Required BiPAP at night.  Weaned off to 10 L high flow during the day Plan:  Continue high flow oxygen.  Wean as tolerated.  BiPAP as needed for naps and at night.  Diuresis as below.  Continue stepdown status.   CAP (community acquired pneumonia) Sepsis COPD exacerbation Sepsis criteria include fever, tachycardia and tachypnea with leukocytosis and lactic acidosis, airspace disease on chest x-ray Received sepsis fluid bolus in the ED Plan: Continue antibiotics Hold fluids Continue  bronchodilators Hold IV steroids for now Monitor vitals and fever curve  Atrial fibrillation with rapid ventricular response, new onset (HCC) Heart rate improved.  Remains on diltiazem  infusion. Plan: Continue IV diltiazem , wean as tolerated, goal heart rate <110 Digoxin per cardiology recommendations Continue IV heparin Would like to avoid amiodarone   Acute on chronic diastolic CHF (congestive heart failure) (HCC) BNP mildly elevated.  Chest x-ray with edema.  Received a dose of IV Lasix  in ED. Plan: Continue IV Lasix  40 mg twice daily Cardiology consulted   Hyponatremia Suspect hypervolemic hyponatremia in the setting of decompensated heart failure.  IV diuresis as above.  Daily renal function.  Appears stable   Obesity, Class III, BMI 40-49.9 (morbid obesity) Complicating factor in overall care and prognosis   Essential hypertension Diltiazem  gtt. as above   DVT prophylaxis: IV heparin Code Status: DNR Family Communication: Sister via phone 6/25 Disposition Plan: Status is: Inpatient Remains inpatient appropriate because: Multiple acute issues as above   Level of care: Stepdown  Consultants:  Cardiology-[facility]  Procedures:  None  Antimicrobials: Rocephin  Azithromycin    Subjective: Seen and examined.  On high flow nasal cannula 10 L.  Conversational dyspnea has improved.  Objective: Vitals:   11/06/23 0500 11/06/23 0700 11/06/23 0754 11/06/23 0800  BP: 98/60 115/69  106/60  Pulse: 85 87 (!) 112 72  Resp: 15 17 20 19   Temp:      TempSrc:      SpO2: 93% 95% 93% 94%  Weight: 125.6 kg     Height:        Intake/Output Summary (Last 24  hours) at 11/06/2023 1121 Last data filed at 11/06/2023 0658 Gross per 24 hour  Intake 763.49 ml  Output 1315 ml  Net -551.51 ml   Filed Weights   11/05/23 0030 11/05/23 0403 11/06/23 0500  Weight: 124.9 kg 123.9 kg 125.6 kg    Examination:  General exam: NAD.  Appears fatigued Respiratory system: Scattered  crackles.  No wheeze.  Normal work of breathing.  10 L Cardiovascular system: 1 S2, tachycardic, regular rhythm, no murmurs, no pedal edema Gastrointestinal system: Soft, NT/ND, normal bowel sounds Central nervous system: Alert and oriented. No focal neurological deficits. Extremities: Symmetric 5 x 5 power. Skin: No rashes, lesions or ulcers Psychiatry: Judgement and insight appear normal. Mood & affect appropriate.     Data Reviewed: I have personally reviewed following labs and imaging studies  CBC: Recent Labs  Lab 11/04/23 2114 11/05/23 0326 11/06/23 0423  WBC 17.1* 14.7* 13.3*  NEUTROABS 14.1*  --   --   HGB 12.0 10.9* 10.4*  HCT 36.5 33.9* 32.1*  MCV 94.6 94.4 93.9  PLT 217 174 194   Basic Metabolic Panel: Recent Labs  Lab 11/04/23 2114 11/05/23 0326 11/06/23 0423  NA 129* 133* 135  K 3.8 3.8 3.4*  CL 95* 101 100  CO2 21* 23 24  GLUCOSE 160* 201* 137*  BUN 58* 61* 73*  CREATININE 1.62* 1.65* 1.66*  CALCIUM  9.1 8.6* 8.9  MG  --   --  2.3   GFR: Estimated Creatinine Clearance: 37.8 mL/min (A) (by C-G formula based on SCr of 1.66 mg/dL (H)). Liver Function Tests: Recent Labs  Lab 11/04/23 2114  AST 23  ALT 14  ALKPHOS 67  BILITOT 1.1  PROT 7.8  ALBUMIN 3.3*   No results for input(s): LIPASE, AMYLASE in the last 168 hours. No results for input(s): AMMONIA in the last 168 hours. Coagulation Profile: Recent Labs  Lab 11/04/23 2114  INR 1.3*   Cardiac Enzymes: No results for input(s): CKTOTAL, CKMB, CKMBINDEX, TROPONINI in the last 168 hours. BNP (last 3 results) No results for input(s): PROBNP in the last 8760 hours. HbA1C: No results for input(s): HGBA1C in the last 72 hours. CBG: Recent Labs  Lab 11/05/23 0027  GLUCAP 189*   Lipid Profile: No results for input(s): CHOL, HDL, LDLCALC, TRIG, CHOLHDL, LDLDIRECT in the last 72 hours. Thyroid Function Tests: No results for input(s): TSH, T4TOTAL, FREET4,  T3FREE, THYROIDAB in the last 72 hours. Anemia Panel: No results for input(s): VITAMINB12, FOLATE, FERRITIN, TIBC, IRON, RETICCTPCT in the last 72 hours. Sepsis Labs: Recent Labs  Lab 11/04/23 2114 11/04/23 2300 11/05/23 0146 11/05/23 0326  PROCALCITON  --  3.76  --   --   LATICACIDVEN 2.6* 2.7* 2.7* 2.0*    Recent Results (from the past 240 hours)  Resp panel by RT-PCR (RSV, Flu A&B, Covid) Anterior Nasal Swab     Status: None   Collection Time: 11/04/23  9:14 PM   Specimen: Anterior Nasal Swab  Result Value Ref Range Status   SARS Coronavirus 2 by RT PCR NEGATIVE NEGATIVE Final    Comment: (NOTE) SARS-CoV-2 target nucleic acids are NOT DETECTED.  The SARS-CoV-2 RNA is generally detectable in upper respiratory specimens during the acute phase of infection. The lowest concentration of SARS-CoV-2 viral copies this assay can detect is 138 copies/mL. A negative result does not preclude SARS-Cov-2 infection and should not be used as the sole basis for treatment or other patient management decisions. A negative result may occur with  improper specimen collection/handling, submission of specimen other than nasopharyngeal swab, presence of viral mutation(s) within the areas targeted by this assay, and inadequate number of viral copies(<138 copies/mL). A negative result must be combined with clinical observations, patient history, and epidemiological information. The expected result is Negative.  Fact Sheet for Patients:  BloggerCourse.com  Fact Sheet for Healthcare Providers:  SeriousBroker.it  This test is no t yet approved or cleared by the [place]  FDA and  has been authorized for detection and/or diagnosis of SARS-CoV-2 by FDA under an Emergency Use Authorization (EUA). This EUA will remain  in effect (meaning this test can be used) for the duration of the COVID-19 declaration under Section 564(b)(1) of  the Act, 21 U.S.C.section 360bbb-3(b)(1), unless the authorization is terminated  or revoked sooner.       Influenza A by PCR NEGATIVE NEGATIVE Final   Influenza B by PCR NEGATIVE NEGATIVE Final    Comment: (NOTE) The Xpert Xpress SARS-CoV-2/FLU/RSV plus assay is intended as an aid in the diagnosis of influenza from Nasopharyngeal swab specimens and should not be used as a sole basis for treatment. Nasal washings and aspirates are unacceptable for Xpert Xpress SARS-CoV-2/FLU/RSV testing.  Fact Sheet for Patients: BloggerCourse.com  Fact Sheet for Healthcare Providers: SeriousBroker.it  This test is not yet approved or cleared by the [place]  FDA and has been authorized for detection and/or diagnosis of SARS-CoV-2 by FDA under an Emergency Use Authorization (EUA). This EUA will remain in effect (meaning this test can be used) for the duration of the COVID-19 declaration under Section 564(b)(1) of the Act, 21 U.S.C. section 360bbb-3(b)(1), unless the authorization is terminated or revoked.     Resp Syncytial Virus by PCR NEGATIVE NEGATIVE Final    Comment: (NOTE) Fact Sheet for Patients: BloggerCourse.com  Fact Sheet for Healthcare Providers: SeriousBroker.it  This test is not yet approved or cleared by the [place]  FDA and has been authorized for detection and/or diagnosis of SARS-CoV-2 by FDA under an Emergency Use Authorization (EUA). This EUA will remain in effect (meaning this test can be used) for the duration of the COVID-19 declaration under Section 564(b)(1) of the Act, 21 U.S.C. section 360bbb-3(b)(1), unless the authorization is terminated or revoked.  Performed at [facility], [address] [place], [place] [address]   Blood Culture (routine x 2)     Status: None (Preliminary result)   Collection Time: 11/04/23  9:14 PM   Specimen: BLOOD   Result Value Ref Range Status   Specimen Description BLOOD BLOOD RIGHT ARM  Final   Special Requests   Final    BOTTLES DRAWN AEROBIC AND ANAEROBIC Blood Culture adequate volume   Culture   Final    NO GROWTH 2 DAYS Performed at [facility], [address] [place], [place] [address]    Report Status PENDING  Incomplete  Blood Culture (routine x 2)     Status: None (Preliminary result)   Collection Time: 11/04/23  9:15 PM   Specimen: BLOOD  Result Value Ref Range Status   Specimen Description BLOOD BLOOD LEFT ARM  Final   Special Requests   Final    BOTTLES DRAWN AEROBIC AND ANAEROBIC Blood Culture results may not be optimal due to an inadequate volume of blood received in culture bottles   Culture   Final    NO GROWTH 2 DAYS Performed at [facility], [address], [place], [place] [address]    Report Status PENDING  Incomplete  MRSA Next Gen by PCR, Nasal     Status: None   Collection Time: 11/05/23  1:04 AM   Specimen: Nasal Mucosa; Nasal Swab  Result Value Ref Range Status   MRSA by PCR Next Gen NOT DETECTED NOT DETECTED Final    Comment: (NOTE) The GeneXpert MRSA Assay (FDA approved for NASAL specimens only), is one component of a comprehensive MRSA colonization surveillance program. It is not intended to diagnose MRSA infection nor to guide or monitor treatment for MRSA infections. Test performance is not FDA approved in patients less than 9 years old. Performed at [facility], [address] [place], [place] [address]          Radiology Studies: ECHOCARDIOGRAM COMPLETE Result Date: 11/05/2023    ECHOCARDIOGRAM REPORT   Patient Name:   Rachel [name] Date of Exam: 11/05/2023 Medical Rec #:  [id number]          Height:       66.0 in Accession #:    [id number]         Weight:       273.1 lb Date of Birth:  11/09/1945         BSA:          2.283 m Patient Age:    66 years           BP:           115/63 mmHg Patient Gender: F                   HR:           99 bpm. Exam Location:  [facility] Procedure: 2D Echo, Cardiac Doppler, Color Doppler and Intracardiac            Opacification Agent (Both Spectral and Color Flow Doppler were            utilized during procedure). Indications:     CHF-Acute Diastolic I50.31  History:         Patient has prior history of Echocardiogram examinations, most                  recent 11/16/2021. CHF.  Sonographer:     Rosina Dunk Referring Phys:  [id number] DELAYNE LULLA SOLIAN Diagnosing Phys: Evalene Lunger MD IMPRESSIONS  1. Left ventricular ejection fraction, by estimation, is 60 to 65%. The left ventricle has normal function. The left ventricle has no regional wall motion abnormalities. Left ventricular diastolic parameters are indeterminate.  2. Right ventricular systolic function is normal. The right ventricular size is normal. There is normal pulmonary artery systolic pressure. The estimated right ventricular systolic pressure is 27.8 mmHg.  3. The mitral valve is normal in structure. No evidence of mitral valve regurgitation. No evidence of mitral stenosis.  4. The aortic valve is normal in structure. Aortic valve regurgitation is not visualized. No aortic stenosis is present.  5. The inferior vena cava is normal in size with greater than 50% respiratory variability, suggesting right atrial pressure of 3 mmHg. FINDINGS  Left Ventricle: Left ventricular ejection fraction, by estimation, is 60 to 65%. The left ventricle has normal function. The left ventricle has no regional wall motion abnormalities. Definity  contrast agent was given IV to delineate the left ventricular  endocardial borders. Strain was performed and the global longitudinal strain is indeterminate. The left ventricular internal cavity size was normal in size. There is no left ventricular hypertrophy. Left ventricular diastolic parameters are indeterminate. Right Ventricle: The right  ventricular size is normal. No increase in right ventricular  wall thickness. Right ventricular systolic function is normal. There is normal pulmonary artery systolic pressure. The tricuspid regurgitant velocity is 2.39 m/s, and  with an assumed right atrial pressure of 5 mmHg, the estimated right ventricular systolic pressure is 27.8 mmHg. Left Atrium: Left atrial size was normal in size. Right Atrium: Right atrial size was normal in size. Pericardium: There is no evidence of pericardial effusion. Mitral Valve: The mitral valve is normal in structure. There is mild calcification of the mitral valve leaflet(s). No evidence of mitral valve regurgitation. No evidence of mitral valve stenosis. MV peak gradient, 9.4 mmHg. The mean mitral valve gradient  is 4.0 mmHg. Tricuspid Valve: The tricuspid valve is normal in structure. Tricuspid valve regurgitation is not demonstrated. No evidence of tricuspid stenosis. Aortic Valve: The aortic valve is normal in structure. Aortic valve regurgitation is not visualized. No aortic stenosis is present. Aortic valve mean gradient measures 5.0 mmHg. Aortic valve peak gradient measures 8.5 mmHg. Aortic valve area, by VTI measures 2.78 cm. Pulmonic Valve: The pulmonic valve was normal in structure. Pulmonic valve regurgitation is not visualized. No evidence of pulmonic stenosis. Aorta: The aortic root is normal in size and structure. Venous: The inferior vena cava is normal in size with greater than 50% respiratory variability, suggesting right atrial pressure of 3 mmHg. IAS/Shunts: No atrial level shunt detected by color flow Doppler. Additional Comments: 3D was performed not requiring image post processing on an independent workstation and was indeterminate.  LEFT VENTRICLE PLAX 2D LVIDd:         4.95 cm     Diastology LVIDs:         3.90 cm     LV e' medial:    8.27 cm/s LV PW:         1.25 cm     LV E/e' medial:  19.1 LV IVS:        1.05 cm     LV e' lateral:   13.70 cm/s LVOT diam:     2.10 cm     LV E/e' lateral: 11.5 LV SV:         57 LV  SV Index:   25 LVOT Area:     3.46 cm  LV Volumes (MOD) LV vol d, MOD A4C: 51.2 ml LV vol s, MOD A4C: 34.1 ml LV SV MOD A4C:     51.2 ml RIGHT VENTRICLE RV Basal diam:  4.30 cm RV Mid diam:    3.30 cm RV S prime:     17.10 cm/s TAPSE (M-mode): 1.8 cm LEFT ATRIUM             Index        RIGHT ATRIUM           Index LA Vol (A2C):   52.2 ml 22.86 ml/m  RA Area:     17.80 cm LA Vol (A4C):   60.6 ml 26.54 ml/m  RA Volume:   50.10 ml  21.94 ml/m LA Biplane Vol: 62.4 ml 27.33 ml/m  AORTIC VALVE AV Area (Vmax):    2.30 cm AV Area (Vmean):   2.31 cm AV Area (VTI):     2.78 cm AV Vmax:           146.00 cm/s AV Vmean:          98.500 cm/s AV VTI:            0.207 m AV Peak Grad:  8.5 mmHg AV Mean Grad:      5.0 mmHg LVOT Vmax:         96.90 cm/s LVOT Vmean:        65.700 cm/s LVOT VTI:          0.166 m LVOT/AV VTI ratio: 0.80  AORTA Ao Root diam: 3.30 cm MITRAL VALVE                TRICUSPID VALVE MV Area (PHT): 2.34 cm     TR Peak grad:   22.8 mmHg MV Area VTI:   1.80 cm     TR Mean grad:   17.0 mmHg MV Peak grad:  9.4 mmHg     TR Vmax:        239.00 cm/s MV Mean grad:  4.0 mmHg     TR Vmean:       202.0 cm/s MV Vmax:       1.53 m/s MV Vmean:      94.9 cm/s    SHUNTS MV Decel Time: 324 msec     Systemic VTI:  0.17 m MR Peak grad: 82.1 mmHg     Systemic Diam: 2.10 cm MR Vmax:      453.00 cm/s MV E velocity: 158.00 cm/s Evalene Lunger MD Electronically signed by Evalene Lunger MD Signature Date/Time: 11/05/2023/2:40:23 PM    Final    DG Chest Port 1 View Result Date: 11/04/2023 CLINICAL DATA:  Questionable sepsis - evaluate for abnormality EXAM: PORTABLE CHEST 1 VIEW COMPARISON:  11/16/2021 FINDINGS: Heart and mediastinal contours within normal limits. Aortic atherosclerosis. Bilateral lower lobe airspace opacities. No effusions. No acute bony abnormality. IMPRESSION: Bilateral lower lobe airspace opacities could reflect edema or infection. Electronically Signed   By: Franky Crease M.D.   On: 11/04/2023 21:36         Scheduled Meds:  acidophilus  1 capsule Oral BID   Chlorhexidine Gluconate Cloth  6 each Topical Q0600   FLUoxetine   10 mg Oral Daily   furosemide   40 mg Intravenous Q12H   gabapentin   100 mg Oral BID   ipratropium-albuterol   3 mL Nebulization Q6H   latanoprost  1 drop Both Eyes QHS   melatonin  5 mg Oral QHS   memantine   10 mg Oral BID   multivitamin with minerals  1 tablet Oral Daily   mouth rinse  15 mL Mouth Rinse 4 times per day   pantoprazole   40 mg Oral Daily   umeclidinium-vilanterol  1 puff Inhalation Daily   zolpidem   5 mg Oral QHS   Continuous Infusions:  ceFEPime (MAXIPIME) IV 2 g (11/06/23 1001)   diltiazem  (CARDIZEM ) infusion 15 mg/hr (11/06/23 0658)   heparin 1,250 Units/hr (11/05/23 1800)   vancomycin  Stopped (11/05/23 2102)     LOS: 2 days     Calvin KATHEE Robson, MD [facility]   If 7PM-7AM, please contact night-coverage  11/06/2023, 11:21 AM

## 2023-11-06 NOTE — Progress Notes (Signed)
 Nutrition Follow-up  DOCUMENTATION CODES:   Morbid obesity  INTERVENTION:   -Liberalize diet to 2 gram sodium for wider variety of meal selections -MVI with minerals daily -Magic cup BID with meals, each supplement provides 290 kcal and 9 grams of protein   NUTRITION DIAGNOSIS:   Increased nutrient needs related to acute illness as evidenced by estimated needs.  Ongoing  GOAL:   Patient will meet greater than or equal to 90% of their needs  Progressing  MONITOR:   PO intake, Supplement acceptance, Diet advancement  REASON FOR ASSESSMENT:   Consult Assessment of nutrition requirement/status  ASSESSMENT:   Pt with medical history significant for COPD and chronic respiratory failure on home O2 at 3 to 4 L, HTN, morbid obesity, history of cochlear implants being admitted with multiple acute medical issues including worsening respiratory failure secondary to COPD and CHF, pneumonia and new onset A-fib with RVR  6/25- advanced to heart healthy diet  Reviewed I/O's: -485 ml x 24 hours and +2.2 L since admission  UOP: 1.7 L x 24 hours  Pt lying in bed, currently off bi-pap, but sleepy. No family present.   Pt currently on a heart healthy duet. No meal completion data available to assess at this time. RD will liberalize diet for wider variety of meal selections.   Palliative care following for goals of care discussions.   Medications reviewed and include neurontin , protonix , and melatonin.   Labs reviewed: K: 3.4, CBGS: 189.  Diet Order:   Diet Order             Diet 2 gram sodium Fluid consistency: Thin  Diet effective now                   EDUCATION NEEDS:   No education needs have been identified at this time  Skin:  Skin Assessment: Reviewed RN Assessment  Last BM:  Unknown  Height:   Ht Readings from Last 1 Encounters:  11/05/23 5' 6 (1.676 m)    Weight:   Wt Readings from Last 1 Encounters:  11/06/23 125.6 kg    Ideal Body Weight:   59.1 kg  BMI:  Body mass index is 44.69 kg/m.  Estimated Nutritional Needs:   Kcal:  1750-1950  Protein:  90-105 grams  Fluid:  1.7-1.9 L    Margery ORN, RD, LDN, CDCES Registered Dietitian III Certified Diabetes Care and Education Specialist If unable to reach this RD, please use RD Inpatient group chat on secure chat between hours of 8am-4 pm daily

## 2023-11-06 NOTE — Progress Notes (Addendum)
 Progress Note  Patient Name: Rachel [name] Date of Encounter: 11/06/2023  Primary Cardiologist: [facility]  Subjective   On BiPAP this morning on rounds, resting comfortably Heart rate 90-100 beats per minute A-fib With removing BiPAP (40% FiO2)  to eat, saturations low 90s, heart rate up to 120 sustained A-fib, periods of 140 On IV Lasix , fluids held  Inpatient Medications    Scheduled Meds:  acidophilus  1 capsule Oral BID   Chlorhexidine Gluconate Cloth  6 each Topical Q0600   FLUoxetine   10 mg Oral Daily   furosemide   40 mg Intravenous Q12H   gabapentin   100 mg Oral BID   ipratropium-albuterol   3 mL Nebulization Q6H   latanoprost  1 drop Both Eyes QHS   melatonin  5 mg Oral QHS   memantine   10 mg Oral BID   mouth rinse  15 mL Mouth Rinse 4 times per day   pantoprazole   40 mg Oral Daily   umeclidinium-vilanterol  1 puff Inhalation Daily   zolpidem   5 mg Oral QHS   Continuous Infusions:  ceFEPime (MAXIPIME) IV Stopped (11/05/23 2304)   diltiazem  (CARDIZEM ) infusion 15 mg/hr (11/06/23 0658)   heparin 1,250 Units/hr (11/05/23 1800)   vancomycin  Stopped (11/05/23 2102)   PRN Meds: acetaminophen  **OR** acetaminophen , albuterol , ondansetron  **OR** ondansetron  (ZOFRAN ) IV, mouth rinse   Vital Signs    Vitals:   11/06/23 0300 11/06/23 0400 11/06/23 0500 11/06/23 0754  BP: (!) 112/56 108/68 98/60   Pulse: (!) 101 100 85   Resp: 15 17 15    Temp:  97.9 F (36.6 C)    TempSrc:  Oral    SpO2: 93% 94% 93% 93%  Weight:   125.6 kg   Height:        Intake/Output Summary (Last 24 hours) at 11/06/2023 0805 Last data filed at 11/06/2023 9341 Gross per 24 hour  Intake 1030.47 ml  Output 1690 ml  Net -659.53 ml   Filed Weights   11/05/23 0030 11/05/23 0403 11/06/23 0500  Weight: 124.9 kg 123.9 kg 125.6 kg    Telemetry    Atrial fibrillation rate 90-100- Personally Reviewed  ECG     - Personally Reviewed  Physical Exam   GEN: Obesity, alert, no acute  distress.   Neck: Unable to estimate JVD. Cardiac: Irregularly irregular, no murmurs, rubs, or gallops.  Respiratory: Clear, scattered Rales GI: Soft, nontender, non-distended.   MS: No edema; No deformity. Neuro:  Alert and oriented x 3; Nonfocal.  Psych: Normal affect.  Labs    Chemistry Recent Labs  Lab 11/04/23 2114 11/05/23 0326 11/06/23 0423  NA 129* 133* 135  K 3.8 3.8 3.4*  CL 95* 101 100  CO2 21* 23 24  GLUCOSE 160* 201* 137*  BUN 58* 61* 73*  CREATININE 1.62* 1.65* 1.66*  CALCIUM  9.1 8.6* 8.9  PROT 7.8  --   --   ALBUMIN 3.3*  --   --   AST 23  --   --   ALT 14  --   --   ALKPHOS 67  --   --   BILITOT 1.1  --   --   GFRNONAA 32* 32* 31*  ANIONGAP 13 9 11      Hematology Recent Labs  Lab 11/04/23 2114 11/05/23 0326 11/06/23 0423  WBC 17.1* 14.7* 13.3*  RBC 3.86* 3.59* 3.42*  HGB 12.0 10.9* 10.4*  HCT 36.5 33.9* 32.1*  MCV 94.6 94.4 93.9  MCH 31.1 30.4 30.4  MCHC 32.9 32.2  32.4  RDW 13.6 13.5 13.6  PLT 217 174 194    Cardiac EnzymesNo results for input(s): TROPONINI in the last 168 hours. No results for input(s): TROPIPOC in the last 168 hours.   BNP Recent Labs  Lab 11/04/23 2114  BNP 347.2*     DDimer No results for input(s): DDIMER in the last 168 hours.   Radiology    DG Chest Port 1 View Result Date: 11/04/2023 IMPRESSION: Bilateral lower lobe airspace opacities could reflect edema or infection. Electronically Signed   By: Franky Crease M.D.   On: 11/04/2023 21:36    Cardiac Studies     Patient Profile     Rachel [name] is a 78 year old woman with history of COPD, chronic respiratory failure on 3 to 4 L nasal cannula, hypertension, morbid obesity presenting from nursing facility November 04, 2023 with respiratory distress, tachycardia, atrial fibrillation with RVR, on BiPAP  Assessment & Plan    Atrial fibrillation with RVR Timing of onset unclear, prior EKG July 2023 normal sinus rhythm -Continue heparin and  diltiazem  infusion,  rate 90-100 on BiPAP -higher rate off BiPAP, sustaining 120 with periods up to 140 -Continue diltiazem , dose of digoxin 0.25 -If rate continues to run high, options somewhat limited secondary to hypotension -Could try bisoprolol 2.5 twice daily -Last resort would be amiodarone infusion though would prefer not to use amiodarone as she has not been fully anticoagulated and there would be risk of conversion/stroke -Plan for NOAC at discharge   Acute respiratory distress In the setting of atrial fibrillation with RVR rate 180 bpm on arrival -Difficult wean off BiPAP -Continue IV Lasix  twice daily On broad-spectrum antibiotics IV fluids on hold   CAP/sepsis/COPD exacerbation - On BiPAP for respiratory support, difficult wean - Remains in atrial fibrillation  - Continue IV Lasix , broad-spectrum antibiotics - Sepsis picture on arrival with fever, tachycardic, leukocytosis, lactic acidosis improving    For questions or updates, please contact [facility] Please consult [email or web address] for contact info under Cardiology/STEMI.   Signed, Velinda Lunger, MD, Ph.D [facility]

## 2023-11-06 NOTE — Progress Notes (Addendum)
 Daily Progress Note   Patient Name: Rachel [name]       Date: 11/06/2023 DOB: 04-02-1946  Age: 78 y.o. MRN#: [id number] Attending Physician: Jhonny Calvin NOVAK, MD Primary Care Physician: Corlis Honor [name], MD Admit Date: 11/04/2023  Reason for Consultation/Follow-up: Establishing goals of care  Subjective: Notes and labs reviewed.  In to see patient.  She is currently resting in bed at this time on oxygen.  She states she uses around 4 L at baseline.  She states she lives in a nursing facility.  She states she is widowed,  and has a son that lives in [place]  and another son that lives a bit closer.  She states she does not see them very often.  Patient shares that she has a sister who lives locally that she does see.  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  She states that at the facility she has a walker and a wheelchair.  She states she goes to bingo, meals, and other activities.    We discussed her diagnosis, prognosis, GOC, EOL wishes disposition and options.  Created space and opportunity for patient  to explore thoughts and feelings regarding current medical information.   A detailed discussion was had today regarding advanced directives.  Concepts specific to code status, artifical feeding and hydration, IV antibiotics and rehospitalization were discussed.  The difference between an aggressive medical intervention path and a comfort care path was discussed.  Values and goals of care important to patient and family were attempted to be elicited.  Discussed limitations of medical interventions to prolong quality of life in some situations and discussed the concept of human mortality.  She confirms DNR and DNI status.  She would like to continue to treat the treatable.      She states she would want her sister to be her surrogate decision maker and would like to complete healthcare power of attorney for this.  Consult placed for spiritual care to assist with completing these.  Length of Stay: 2  Current Medications: Scheduled Meds:   acidophilus  1 capsule Oral BID   Chlorhexidine Gluconate Cloth  6 each Topical Q0600   FLUoxetine   10 mg Oral Daily   furosemide   40 mg Intravenous Q12H   gabapentin   100 mg Oral BID   ipratropium-albuterol   3 mL Nebulization Q6H   latanoprost  1 drop Both Eyes QHS   melatonin  5 mg Oral QHS   memantine   10 mg Oral BID   multivitamin with minerals  1 tablet Oral Daily   mouth rinse  15 mL Mouth Rinse 4 times per day   pantoprazole   40 mg Oral Daily   potassium chloride  40 mEq Oral Q4H   umeclidinium-vilanterol  1 puff Inhalation Daily   zolpidem   5 mg Oral QHS    Continuous Infusions:  ceFEPime (MAXIPIME) IV 2 g (11/06/23 1001)   diltiazem  (CARDIZEM ) infusion 15 mg/hr (11/06/23 1526)   heparin 1,250 Units/hr (11/05/23 1800)   vancomycin  Stopped (11/05/23 2102)    PRN Meds: acetaminophen  **OR** acetaminophen , albuterol , ondansetron  **OR** ondansetron  (ZOFRAN ) IV, mouth rinse  Physical Exam Pulmonary:     Effort: Pulmonary effort is normal.   Skin:    General: Skin is warm and dry.   Neurological:     Mental Status: She is alert.             Vital Signs: BP (!) 127/59   Pulse 96   Temp 98.4 F (36.9 C)   Resp 20   Ht 5' 6 (1.676 m)   Wt 125.6 kg   SpO2 92%   BMI 44.69 kg/m  SpO2: SpO2: 92 % O2 Device: O2 Device: High Flow Nasal Cannula O2 Flow Rate: O2 Flow Rate (L/min): 10 L/min  Intake/output summary:  Intake/Output Summary (Last 24 hours) at 11/06/2023 1609 Last data filed at 11/06/2023 9341 Gross per 24 hour  Intake 627.91 ml  Output 850 ml  Net -222.09 ml   LBM:   Baseline Weight: Weight: 121 kg Most recent weight: Weight: 125.6 kg        Patient Active Problem List    Diagnosis Date Noted   Acute on chronic congestive heart failure (HCC) 11/05/2023   Hypoxia 11/05/2023   Atrial fibrillation with rapid ventricular response, new onset (HCC) 11/04/2023   CAP (community acquired pneumonia) 11/04/2023   Sepsis (HCC) 11/04/2023   Hyponatremia 11/04/2023   Obesity, Class III, BMI 40-49.9 (morbid obesity) 11/04/2023   Acute respiratory failure with hypoxia (HCC) 11/04/2023   Hyperkalemia 11/18/2021   Acute on chronic diastolic CHF (congestive heart failure) (HCC) 11/16/2021   Shingles 07/15/2019   Acute confusion 07/15/2019   AKI (acute kidney injury) (HCC) 07/15/2019   COPD exacerbation (HCC) 07/14/2019   Acute on chronic respiratory failure with hypoxemia (HCC) 09/10/2018   Shortness of breath 12/10/2017   Smoker 12/10/2017   Essential hypertension 12/10/2017   Morbid obesity (HCC) 12/10/2017   Bilateral hip pain 12/10/2017   Dyslipidemia 12/10/2017   Hard of hearing 12/10/2017   Facial abscess 12/24/2015   Open wound of left lower leg 12/29/2013   Leg laceration 12/23/2013   Open wound of knee, leg (except thigh), and ankle, complicated 02/01/2013   Wound of right leg 01/13/2013    Palliative Care Assessment & Plan    Recommendations/Plan: DNR/DNI. Continue to treat the treatable. Patient would not want her sister to be her H POA as she lives closer and sees her sister frequently.  Spiritual care consult placed for this.  PMT will shadow peripherally for needs.  Please reach out for immediate needs.  Code Status:    Code Status Orders  (From admission, onward)           Start     Ordered   11/04/23 2358  Do not attempt resuscitation (DNR)-  Limited -Do Not Intubate (DNI)  Continuous       Question Answer Comment  If pulseless and not breathing No CPR or chest compressions.   In Pre-Arrest Conditions (Patient Is Breathing and Has A Pulse) Do not intubate. Provide all appropriate non-invasive medical interventions. Avoid ICU transfer  unless indicated or required.   Consent: Discussion documented in EHR or advanced directives reviewed      11/04/23 2359           Code Status History     Date Active Date Inactive Code Status Order ID Comments User Context   11/04/2023 2106 11/04/2023 2359 Partial Code [id number]  Suzanne Kirsch, MD ED   11/16/2021 1420 11/21/2021 0209 DNR [id number]  Tobie Calix, MD ED   11/16/2021 0436 11/16/2021 1420 Full Code [id number]  Cleatus Delayne GAILS, MD ED   07/15/2019 0010 07/20/2019 1906 Full Code [id number]  Cleatus Delayne GAILS, MD ED   09/10/2018 0346 09/11/2018 2043 Full Code [id number]  Stephania Ozell RAMAN Inpatient   12/24/2015 1734 12/27/2015 1502 Full Code [id number]  Sherial Bail, MD Inpatient      Advance Directive Documentation    Flowsheet Row Most Recent Value  Type of Advance Directive Out of facility DNR (pink MOST or yellow form)  Pre-existing out of facility DNR order (yellow form or pink MOST form) --  MOST Form in Place? --    Thank you for allowing the Palliative Medicine Team to assist in the care of this patient.   Camelia Lewis, NP  Please contact Palliative Medicine Team phone at [phone] for questions and concerns.

## 2023-11-06 NOTE — Telephone Encounter (Signed)

## 2023-11-06 NOTE — Progress Notes (Signed)
 PHARMACY CONSULT NOTE - FOLLOW UP  Pharmacy Consult for Electrolyte Monitoring and Replacement   Recent Labs: Potassium (mmol/L)  Date Value  11/06/2023 3.4 (L)  05/19/2011 3.4 (L)   Magnesium  (mg/dL)  Date Value  93/73/7974 2.3  05/20/2011 1.5 (L)   Calcium  (mg/dL)  Date Value  93/73/7974 8.9   Calcium , Total (mg/dL)  Date Value  98/93/7986 9.1   Albumin (g/dL)  Date Value  93/75/7974 3.3 (L)  05/17/2011 3.7   Phosphorus (mg/dL)  Date Value  98/92/7986 3.7   Sodium (mmol/L)  Date Value  11/06/2023 135  05/19/2011 143    Assessment: 78 y.o. female with medical history significant for COPD and chronic respiratory failure on home O2 at 3 to 4 L, HTN, morbid obesity, history of cochlear implants being admitted with multiple acute medical issues including worsening respiratory failure secondary to COPD and CHF, pneumonia and new onset A-fib with RVR. Pharmacy is asked to follow and replace electrolytes while in CCU  Diuretics: furosemide  40 mg IV BID  Goal of Therapy:  Potassium 4.0 - 5.1 mmol/L Magnesium  2.0 - 2.4 mg/dL All Other Electrolytes WNL  Plan:  ---40 mEq po KCl x 2 ---recheck electrolytes in am  Rachel [name] ,PharmD Clinical Pharmacist 11/06/2023 12:00 PM
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
## 2023-11-06 NOTE — Evaluation (Signed)
 Physical Therapy Evaluation Patient Details Name: Rachel [name] MRN: [id number] DOB: 05/02/1946 Today's Date: 11/06/2023  History of Present Illness  Pt is a 78 y.o. female presented on 11/04/23 by EMS they received a call for acute respiratory distress and a fever. On arrival pt was febrile, tachypneic, and had rapid A-fib being admitted with multiple acute medical issues including worsening respiratory failure secondary to COPD and CHF, pneumonia and new onset A-fib with RVR. PMH: COPD and chronic respiratory failure on home O2 at 3 to 4 L, HTN, morbid obesity, history of cochlear implants  Clinical Impression  PT session performed with OT to promote pt/staff safety and manage line/lead/tubes. Prior to recent medical concerns, pt reports living at [facility] requiring assistance with ADLs and reports being on 3-4 L O2 via [place].  Pt reports being able to ambulate ~15 ft with mod independence with RW or without AD (depending on the day) to the bathroom. Currently pt is on 10L via HFNC, able to perform bed mobility with 2+ CGA (supine to sit), is able to maintain static seated balance with CGA and requires mod A to get back into bed following session. PT constantly monitored vitals throughout session with highest recorded HR at 132 bpm with seated activity and SpO2 ranging from 87-92% on 10 L O2 via HFNC. Pt did experience a decrease in BP from (127/72) while seated to (102/49) while supine but remained asymptomatic (RN notified and made aware). PT session would currently benefit from skilled PT to address noted impairments and functional limitations (see below for any additional details).  Upon hospital discharge, pt would benefit from ongoing therapy.         If plan is discharge home, recommend the following: Two people to help with walking and/or transfers;A lot of help with bathing/dressing/bathroom;Assistance with cooking/housework;Assist for transportation;Help with stairs or ramp for entrance    Can travel by private vehicle   No    Equipment Recommendations    Recommendations for Other Services       Functional Status Assessment Patient has had a recent decline in their functional status and/or demonstrates limited ability to make significant improvements in function in a reasonable and predictable amount of time     Precautions / Restrictions Precautions Precautions: Fall Recall of Precautions/Restrictions: Impaired Restrictions Weight Bearing Restrictions Per Provider Order: No      Mobility  Bed Mobility Overal bed mobility: Needs Assistance Bed Mobility: Supine to Sit, Sit to Supine     Supine to sit: Contact guard, +2 for physical assistance, HOB elevated Sit to supine: HOB elevated, Mod assist, +2 for safety/equipment (for line management)   General bed mobility comments: Increased time needed to perform movement, requires 2nd staff memeber to assist with line/lead/tube management    Transfers                   General transfer comment: Deferred due to medical status    Ambulation/Gait               General Gait Details: Deferred due to medical status  Stairs            Wheelchair Mobility     Tilt Bed    Modified Rankin (Stroke Patients Only)       Balance Overall balance assessment: Needs assistance Sitting-balance support: Single extremity supported, Feet unsupported Sitting balance-Leahy Scale: Fair Sitting balance - Comments: Steady seated balance with 1x CGA to maintian upright posture, able to reach within BOS  Standing balance comment: Deferred                             Pertinent Vitals/Pain Pain Assessment Pain Assessment: No/denies pain    Home Living Family/patient expects to be discharged to:: Skilled nursing facility                        Prior Function Prior Level of Function : Needs assist       Physical Assist : Mobility (physical) Mobility (physical): Bed  mobility;Transfers;Gait   Mobility Comments: Pt reports ambulating ~15 ft at baseline with no AD/RW (on days she feels unsteady) to the bathroom. She reports having [name] (a Financial controller at [employer]) assist her when needed for transfers and for bathing       Extremity/Trunk Assessment   Upper Extremity Assessment Upper Extremity Assessment: Defer to OT evaluation    Lower Extremity Assessment Lower Extremity Assessment: Generalized weakness    Cervical / Trunk Assessment Cervical / Trunk Assessment: Normal  Communication   Communication Communication: Impaired Factors Affecting Communication: Hearing impaired (Has an auditory amplifier which she uses to increase communication)    Cognition Arousal: Alert Behavior During Therapy: WFL for tasks assessed/performed   PT - Cognitive impairments: No apparent impairments                         Following commands: Intact       Cueing Cueing Techniques: Verbal cues, Gestural cues, Tactile cues     General Comments General comments (skin integrity, edema, etc.): Vitals monitored throughout session. Pre session HR: 94-103 bpm, Spo2: 92-95% on 10 L O2 via HFNC,  With seated activity highest recorded HR: 132 bpm, Spo2: 87-92% on 10 L O2 via HFNC, BP:(127/72), RR:22, Post session HR: 102 bpm, Spo2: 93+% on 10 L O2 via HFNC, BP:(102/49) and asymptomatic.    Exercises     Assessment/Plan    PT Assessment Patient needs continued PT services  PT Problem List Decreased strength;Decreased balance;Decreased mobility;Decreased activity tolerance;Decreased safety awareness       PT Treatment Interventions Gait training;DME instruction;Functional mobility training;Therapeutic activities;Therapeutic exercise;Balance training;Patient/family education    PT Goals (Current goals can be found in the Care Plan section)  Acute Rehab PT Goals Patient Stated Goal: To get stronger and return home PT Goal Formulation: With patient Time For  Goal Achievement: 11/20/23 Potential to Achieve Goals: Fair    Frequency Min 2X/week     Co-evaluation PT/OT/SLP Co-Evaluation/Treatment: Yes Reason for Co-Treatment: Complexity of the patient's impairments (multi-system involvement);For patient/therapist safety PT goals addressed during session: Mobility/safety with mobility OT goals addressed during session: ADL's and self-care       AM-PAC PT 6 Clicks Mobility  Outcome Measure Help needed turning from your back to your side while in a flat bed without using bedrails?: A Little Help needed moving from lying on your back to sitting on the side of a flat bed without using bedrails?: A Little Help needed moving to and from a bed to a chair (including a wheelchair)?: A Little Help needed standing up from a chair using your arms (e.g., wheelchair or bedside chair)?: A Lot Help needed to walk in hospital room?: Total Help needed climbing 3-5 steps with a railing? : Total 6 Click Score: 13    End of Session Equipment Utilized During Treatment: Oxygen Activity Tolerance: Patient limited by fatigue;Treatment limited secondary to  medical complications (Comment) (Increased HR with decreased O2 saturations) Patient left: in bed;with call bell/phone within reach;with bed alarm set Nurse Communication: Mobility status;Precautions PT Visit Diagnosis: Muscle weakness (generalized) (M62.81);Other abnormalities of gait and mobility (R26.89)    Time: 1140-1205 PT Time Calculation (min) (ACUTE ONLY): 25 min   Charges:                Alianis Trimmer, SPT 11/06/23, 1:58 PM

## 2023-11-06 NOTE — TOC Progression Note (Signed)
 Transition of Care [facility]) - Progression Note    Patient Details  Name: Rachel [name] MRN: [id number] Date of Birth: 09-05-1945  Transition of Care [facility]) CM/SW Contact  Tanaysha Alkins A Arjan Strohm, RN Phone Number: 11/06/2023, 3:35 PM  Clinical Narrative:    Chart reviewed. Noted that patient was admitted with Acute Respiratory failure with hypoxia. CAP and Sepsis.  Patient is currently on IV Rocephin , and IV Azithromycin . Patient is on 10 L of HFNC and requires BIPAP at night.    I have meet with Rachel [name] at bedside.  She informs me that she is a resident of [facility].  Rachel [name] is hard of hearing and applied her hearing aid prior to our conversation.  She was somewhat short of breath but was able to eat her lunch.    I have spoken with Daril, Admission Coordinator at [facility].  Daril informs me that patient is a long term care resident of [facility].  Daril does confirm that patient is on oxygen at facility but has not required the use of a CPAP or Bipap.  I have informed Daril that patient is currently using Bipap at night while in the hospital. Daril reports that if patient will need CPAP/BiPAP facility will need order 2 days prior to patient discharging back to the facility.    I have informed Daril that PT/OT has recommended SNF on discharge.  Daril reports that hospital can [facility] for SNF authorization.  TOC will continue to follow for discharge planning.     Expected Discharge Plan: Skilled Nursing Facility Barriers to Discharge: Continued Medical Work up  Expected Discharge Plan and Services   Discharge Planning Services: CM Consult Post Acute Care Choice: Skilled Nursing Facility Living arrangements for the past 2 months: Skilled Nursing Facility                                       Social Determinants of Health (SDOH) Interventions SDOH Screenings   Food Insecurity: Patient Unable To Answer (11/05/2023)  Housing: Unknown  (11/05/2023)  Transportation Needs: Patient Unable To Answer (11/05/2023)  Utilities: Patient Unable To Answer (11/05/2023)  Social Connections: Patient Unable To Answer (11/05/2023)  Tobacco Use: Medium Risk (11/04/2023)    Readmission Risk Interventions     No data to display

## 2023-11-06 NOTE — Evaluation (Signed)
 Occupational Therapy Evaluation Patient Details Name: Rachel [name] MRN: [id number] DOB: 06-01-1945 Today's Date: 11/06/2023   History of Present Illness   Pt is a 78 y.o. female presented on 11/04/23 by EMS they received a call for acute respiratory distress and a fever. On arrival pt was febrile, tachypneic, and had rapid A-fib being admitted with multiple acute medical issues including worsening respiratory failure secondary to COPD and CHF, pneumonia and new onset A-fib with RVR. PMH: COPD and chronic respiratory failure on home O2 at 3 to 4 L, HTN, morbid obesity, history of cochlear implants     Clinical Impressions Pt was seen for OT evaluation this date. PTA, pt resides at [facility] where she has her own room. She is able to ambulate with MOD I using a RW to the bathroom and back ~10-15 ft at baseline. She reports being able to manage most ADLs on her own except staff assists with bathing to get down into tub.  Pt presents to acute OT demonstrating impaired ADL performance and functional mobility 2/2 weakness, low activity tolerance, mild balance deficits. Pt is on 10L HFNC on entry with stable sp02. Did not require an increase in 02 needs during session with lowest reading of 85% (bad pleth) and 87% with good pleth. HR up to 132 max with EOB sitting. She required CGA x2 for supine to sit at EOB and was able to maintain seated balance with CGA. Education on PLB provided to help raise sp02 with >90%. Pt fatigued quickly and was returned to supine with Mod A for BLE management and SBA for lines/leads management. Pt took a sip of water and coughed, requiring long sit in bed with Min A to clear. VSS remained stable. Pt is not at her PLOF and will benefit from skilled OT services to address noted impairments and functional limitations. Do anticipate the need for follow up OT services upon acute hospital DC.      If plan is discharge home, recommend the following:   A lot of help with  bathing/dressing/bathroom;A lot of help with walking and/or transfers;Two people to help with walking and/or transfers     Functional Status Assessment   Patient has had a recent decline in their functional status and demonstrates the ability to make significant improvements in function in a reasonable and predictable amount of time.     Equipment Recommendations   Other (comment) (defer)     Recommendations for Other Services         Precautions/Restrictions   Precautions Precautions: Fall Recall of Precautions/Restrictions: Impaired Restrictions Weight Bearing Restrictions Per Provider Order: No     Mobility Bed Mobility Overal bed mobility: Needs Assistance Bed Mobility: Supine to Sit, Sit to Supine     Supine to sit: Contact guard, +2 for physical assistance, HOB elevated Sit to supine: HOB elevated, Mod assist, +2 for safety/equipment   General bed mobility comments: increased time/effort to reach EOB, feet did not touch the floor, Mod A for BLE management safely return to supine    Transfers                   General transfer comment: Deferred due to medical status      Balance Overall balance assessment: Needs assistance Sitting-balance support: Single extremity supported, Feet unsupported Sitting balance-Leahy Scale: Fair Sitting balance - Comments: no LOB while seated EOB without feet on floor, CGA for balance       Standing balance comment: Deferred  ADL either performed or assessed with clinical judgement   ADL Overall ADL's : Needs assistance/impaired                     Lower Body Dressing: Total assistance Lower Body Dressing Details (indicate cue type and reason): d/t fatigue and respiratory status                     Vision         Perception         Praxis         Pertinent Vitals/Pain Pain Assessment Pain Assessment: No/denies pain     Extremity/Trunk  Assessment Upper Extremity Assessment Upper Extremity Assessment: Generalized weakness   Lower Extremity Assessment Lower Extremity Assessment: Generalized weakness   Cervical / Trunk Assessment Cervical / Trunk Assessment: Normal   Communication Communication Communication: Impaired Factors Affecting Communication: Hearing impaired (Has an auditory amplifier which she uses to increase communication)   Cognition Arousal: Alert Behavior During Therapy: WFL for tasks assessed/performed                                 Following commands: Intact       Cueing  General Comments   Cueing Techniques: Verbal cues;Gestural cues;Tactile cues  Vitals monitored throughout session. Pre session HR: 94-103 bpm, Spo2: 92-95% on 10 L O2 via HFNC, With seated activity highest recorded HR: 132 bpm, Spo2: 87-92% on 10 L O2 via HFNC, BP:(127/72), RR:22, Post session HR: 102 bpm, Spo2: 93+% on 10 L O2 via HFNC, BP:(102/49) and asymptomatic.   Exercises     Shoulder Instructions      Home Living Family/patient expects to be discharged to:: Skilled nursing facility                                        Prior Functioning/Environment Prior Level of Function : Needs assist       Physical Assist : Mobility (physical) Mobility (physical): Bed mobility;Transfers;Gait   Mobility Comments: Pt reports ambulating ~15 ft at baseline with no AD/RW (on days she feels unsteady) to the bathroom. She reports having [name] (a Financial controller at [employer]) assist her when needed for transfers and for bathing ADLs Comments: MOD I with ADLs, only ambulates to the bathroom and back, has assist with bathing in tub using lift; sleeps in a recliner    OT Problem List: Decreased strength;Decreased activity tolerance;Impaired balance (sitting and/or standing)   OT Treatment/Interventions: Self-care/ADL training;Therapeutic exercise;Energy conservation;DME and/or AE instruction;Patient/family  education;Balance training      OT Goals(Current goals can be found in the care plan section)   Acute Rehab OT Goals Patient Stated Goal: improve breathing and walk better OT Goal Formulation: With patient Time For Goal Achievement: 11/20/23 Potential to Achieve Goals: Fair ADL Goals Pt Will Perform Grooming: with set-up;sitting Pt Will Perform Upper Body Bathing: with min assist;sitting Pt Will Transfer to Toilet: with min assist;stand pivot transfer;bedside commode   OT Frequency:  Min 2X/week    Co-evaluation PT/OT/SLP Co-Evaluation/Treatment: Yes Reason for Co-Treatment: Complexity of the patient's impairments (multi-system involvement);For patient/therapist safety PT goals addressed during session: Mobility/safety with mobility OT goals addressed during session: ADL's and self-care      AM-PAC OT 6 Clicks Daily Activity     Outcome Measure Help from another person  eating meals?: None Help from another person taking care of personal grooming?: A Little Help from another person toileting, which includes using toliet, bedpan, or urinal?: A Lot Help from another person bathing (including washing, rinsing, drying)?: A Lot Help from another person to put on and taking off regular upper body clothing?: A Lot Help from another person to put on and taking off regular lower body clothing?: Total 6 Click Score: 14   End of Session Equipment Utilized During Treatment: Oxygen (HFNC) Nurse Communication: Mobility status  Activity Tolerance: Patient tolerated treatment well;Patient limited by fatigue Patient left: in bed;with call bell/phone within reach;with bed alarm set  OT Visit Diagnosis: Other abnormalities of gait and mobility (R26.89);Muscle weakness (generalized) (M62.81)                Time: 8860-8794 OT Time Calculation (min): 26 min Charges:  OT General Charges $OT Visit: 1 Visit OT Evaluation $OT Eval Low Complexity: 1 Low  Kaymen Adrian, OTR/L 11/06/23, 2:19  PM  Mardell Cragg E Elijha Dedman 11/06/2023, 2:14 PM

## 2023-11-07 ENCOUNTER — Inpatient Hospital Stay

## 2023-11-07 DIAGNOSIS — I4891 Unspecified atrial fibrillation: Secondary | ICD-10-CM | POA: Diagnosis not present

## 2023-11-07 DIAGNOSIS — J9601 Acute respiratory failure with hypoxia: Secondary | ICD-10-CM | POA: Diagnosis not present

## 2023-11-07 LAB — CBC
HCT: 31 % — ABNORMAL LOW (ref 36.0–46.0)
Hemoglobin: 10.1 g/dL — ABNORMAL LOW (ref 12.0–15.0)
MCH: 30.5 pg (ref 26.0–34.0)
MCHC: 32.6 g/dL (ref 30.0–36.0)
MCV: 93.7 fL (ref 80.0–100.0)
Platelets: 209 10*3/uL (ref 150–400)
RBC: 3.31 MIL/uL — ABNORMAL LOW (ref 3.87–5.11)
RDW: 14 % (ref 11.5–15.5)
WBC: 13.4 10*3/uL — ABNORMAL HIGH (ref 4.0–10.5)
nRBC: 0 % (ref 0.0–0.2)

## 2023-11-07 LAB — BASIC METABOLIC PANEL WITH GFR
Anion gap: 9 (ref 5–15)
BUN: 76 mg/dL — ABNORMAL HIGH (ref 8–23)
CO2: 25 mmol/L (ref 22–32)
Calcium: 9 mg/dL (ref 8.9–10.3)
Chloride: 102 mmol/L (ref 98–111)
Creatinine, Ser: 1.81 mg/dL — ABNORMAL HIGH (ref 0.44–1.00)
GFR, Estimated: 28 mL/min — ABNORMAL LOW (ref >60)
Glucose, Bld: 114 mg/dL — ABNORMAL HIGH (ref 70–99)
Potassium: 3.4 mmol/L — ABNORMAL LOW (ref 3.5–5.1)
Sodium: 136 mmol/L (ref 135–145)

## 2023-11-07 LAB — TSH: TSH: 0.848 u[IU]/mL (ref 0.350–4.500)

## 2023-11-07 LAB — HEPARIN LEVEL (UNFRACTIONATED)
Heparin Unfractionated: 0.28 [IU]/mL — ABNORMAL LOW (ref 0.30–0.70)
Heparin Unfractionated: 0.26 [IU]/mL — ABNORMAL LOW (ref 0.30–0.70)

## 2023-11-07 LAB — MAGNESIUM: Magnesium: 2.3 mg/dL (ref 1.7–2.4)

## 2023-11-07 MED ORDER — HEPARIN BOLUS VIA INFUSION
1300.0000 [IU] | Freq: Once | INTRAVENOUS | Status: AC
  Administered 2023-11-07: 1300 [IU] via INTRAVENOUS
  Filled 2023-11-07: qty 1300

## 2023-11-07 MED ORDER — ENSURE PLUS HIGH PROTEIN PO LIQD
237.0000 mL | Freq: Two times a day (BID) | ORAL | Status: DC
  Administered 2023-11-07 – 2023-11-13 (×10): 237 mL via ORAL

## 2023-11-07 MED ORDER — SODIUM CHLORIDE 0.9 % IV SOLN
500.0000 mg | INTRAVENOUS | Status: DC
  Administered 2023-11-07 – 2023-11-08 (×2): 500 mg via INTRAVENOUS
  Filled 2023-11-07 (×3): qty 5

## 2023-11-07 MED ORDER — DILTIAZEM HCL 30 MG PO TABS
60.0000 mg | ORAL_TABLET | Freq: Four times a day (QID) | ORAL | Status: DC
  Administered 2023-11-07 – 2023-11-08 (×4): 60 mg via ORAL
  Filled 2023-11-07 (×4): qty 2

## 2023-11-07 MED ORDER — POTASSIUM CHLORIDE CRYS ER 20 MEQ PO TBCR: 40.0000 meq | EXTENDED_RELEASE_TABLET | Freq: Once | ORAL | Status: DC

## 2023-11-07 MED ORDER — METHYLPREDNISOLONE SODIUM SUCC 40 MG IJ SOLR
40.0000 mg | Freq: Every day | INTRAMUSCULAR | Status: DC
  Administered 2023-11-07 – 2023-11-09 (×3): 40 mg via INTRAVENOUS
  Filled 2023-11-07 (×3): qty 1

## 2023-11-07 MED ORDER — POTASSIUM CHLORIDE CRYS ER 20 MEQ PO TBCR
40.0000 meq | EXTENDED_RELEASE_TABLET | Freq: Four times a day (QID) | ORAL | Status: AC
  Administered 2023-11-07 (×2): 40 meq via ORAL
  Filled 2023-11-07 (×2): qty 2

## 2023-11-07 MED ORDER — SODIUM CHLORIDE 0.9 % IV SOLN
2.0000 g | INTRAVENOUS | Status: AC
  Administered 2023-11-07 – 2023-11-09 (×3): 2 g via INTRAVENOUS
  Filled 2023-11-07 (×4): qty 20

## 2023-11-07 NOTE — Progress Notes (Addendum)
 PHARMACY CONSULT NOTE - FOLLOW UP  Pharmacy Consult for Electrolyte Monitoring and Replacement   Recent Labs: Potassium (mmol/L)  Date Value  11/07/2023 3.4 (L)  05/19/2011 3.4 (L)   Magnesium  (mg/dL)  Date Value  93/72/7974 2.3  05/20/2011 1.5 (L)   Calcium  (mg/dL)  Date Value  93/72/7974 9.0   Calcium , Total (mg/dL)  Date Value  98/93/7986 9.1   Albumin (g/dL)  Date Value  93/75/7974 3.3 (L)  05/17/2011 3.7   Phosphorus (mg/dL)  Date Value  98/92/7986 3.7   Sodium (mmol/L)  Date Value  11/07/2023 136  05/19/2011 143    Assessment: 78 y.o. female with medical history significant for COPD and chronic respiratory failure on home O2 at 3 to 4 L, HTN, morbid obesity, history of cochlear implants being admitted with multiple acute medical issues including worsening respiratory failure secondary to COPD and CHF, pneumonia and new onset A-fib with RVR. Pharmacy is asked to follow and replace electrolytes while in CCU  Diuretics: furosemide  40 mg IV BID  Goal of Therapy:  Potassium 4.0 - 5.1 mmol/L Magnesium  2.0 - 2.4 mg/dL All Other Electrolytes WNL  Plan:  ---K = 3.4, give Kcl 40 mEq po q6h x 2 ---recheck electrolytes in am  Rachel [name] ,PharmD Clinical Pharmacist 11/07/2023 6:39 AM

## 2023-11-07 NOTE — NC FL2 (Signed)
 [place]  MEDICAID [id number] LEVEL OF CARE FORM     IDENTIFICATION  Patient Name: Rachel [name] Birthdate: [birth year] Sex: female Admission Date (Current Location): 11/04/2023  [place] and [place] Number:  [place] [id number] Facility and Address:  [facility], [address], [place], [place] [address]      Provider Number: [id number]  Attending Physician Name and Address:  Jhonny Calvin NOVAK, MD  Relative Name and Phone Number:  Gaelyn Tukes [phone]    Current Level of Care: Hospital Recommended Level of Care: Skilled Nursing Facility Prior Approval Number:    Date Approved/Denied:   PASRR Number: [id number]  Discharge Plan: SNF    Current Diagnoses: Patient Active Problem List   Diagnosis Date Noted   Acute on chronic congestive heart failure (HCC) 11/05/2023   Hypoxia 11/05/2023   Atrial fibrillation with rapid ventricular response, new onset (HCC) 11/04/2023   CAP (community acquired pneumonia) 11/04/2023   Sepsis (HCC) 11/04/2023   Hyponatremia 11/04/2023   Obesity, Class III, BMI 40-49.9 (morbid obesity) 11/04/2023   Acute respiratory failure with hypoxia (HCC) 11/04/2023   Hyperkalemia 11/18/2021   Acute on chronic diastolic CHF (congestive heart failure) (HCC) 11/16/2021   Shingles 07/15/2019   Acute confusion 07/15/2019   AKI (acute kidney injury) (HCC) 07/15/2019   COPD exacerbation (HCC) 07/14/2019   Acute on chronic respiratory failure with hypoxemia (HCC) 09/10/2018   Shortness of breath 12/10/2017   Smoker 12/10/2017   Essential hypertension 12/10/2017   Morbid obesity (HCC) 12/10/2017   Bilateral hip pain 12/10/2017   Dyslipidemia 12/10/2017   Hard of hearing 12/10/2017   Facial abscess 12/24/2015   Open wound of left lower leg 12/29/2013   Leg laceration 12/23/2013   Open wound of knee, leg (except thigh), and ankle, complicated 02/01/2013   Wound of right leg 01/13/2013    Orientation RESPIRATION BLADDER  Height & Weight     Self, Time, Situation, Place  O2, Other (Comment) (Currently on 10L of 02 will wean as tolerated, BIPAP) Continent Weight: 275 lb 9.2 oz (125 kg) Height:  5' 6 (167.6 cm)  BEHAVIORAL SYMPTOMS/MOOD NEUROLOGICAL BOWEL NUTRITION STATUS      Continent Diet (See DC Summary)  AMBULATORY STATUS COMMUNICATION OF NEEDS Skin   Extensive Assist Verbally Normal                       Personal Care Assistance Level of Assistance  Feeding, Bathing, Dressing Bathing Assistance: Limited assistance Feeding assistance: Independent Dressing Assistance: Limited assistance     Functional Limitations Info  Sight, Hearing, Speech Sight Info: Adequate Hearing Info: Impaired (cochlear implant) Speech Info: Adequate    SPECIAL CARE FACTORS FREQUENCY  PT (By licensed PT), OT (By licensed OT)     PT Frequency: 5x/week OT Frequency: 5x/week            Contractures Contractures Info: Not present    Additional Factors Info  Code Status, Allergies Code Status Info: DNR Limited Allergies Info: No Known Allergies           Current Medications (11/07/2023):  This is the current hospital active medication list Current Facility-Administered Medications  Medication Dose Route Frequency Provider Last Rate Last Admin   acetaminophen  (TYLENOL ) tablet 650 mg  650 mg Oral Q6H PRN Duncan, [place] V, MD   650 mg at 11/06/23 1732   Or   acetaminophen  (TYLENOL ) suppository 650 mg  650 mg Rectal Q6H PRN Cleatus Delayne GAILS, MD  acidophilus (RISAQUAD) capsule 1 capsule  1 capsule Oral BID Sreenath, Sudheer B, MD   1 capsule at 11/07/23 0809   albuterol  (PROVENTIL ) (2.5 MG/3ML) 0.083% nebulizer solution 2.5 mg  2.5 mg Nebulization Q2H PRN Duncan, Hazel V, MD       alum & mag hydroxide-simeth (MAALOX/MYLANTA) 200-200-20 MG/5ML suspension 30 mL  30 mL Oral Q4H PRN Sreenath, Sudheer B, MD   30 mL at 11/06/23 1931   azithromycin  (ZITHROMAX ) 500 mg in sodium chloride  0.9 % 250 mL IVPB  500 mg  Intravenous Q24H Sreenath, Sudheer B, MD       cefTRIAXone  (ROCEPHIN ) 2 g in sodium chloride  0.9 % 100 mL IVPB  2 g Intravenous Q24H Sreenath, Sudheer B, MD       Chlorhexidine  Gluconate Cloth 2 % PADS 6 each  6 each Topical Q0600 Cleatus Delayne GAILS, MD   6 each at 11/07/23 9391   diltiazem  (CARDIZEM ) tablet 60 mg  60 mg Oral Q6H Dunn, Ryan M, PA-C   60 mg at 11/07/23 1131   feeding supplement (ENSURE PLUS HIGH PROTEIN) liquid 237 mL  237 mL Oral BID BM Jhonny Sahara B, MD   237 mL at 11/07/23 0810   FLUoxetine  (PROZAC ) capsule 10 mg  10 mg Oral Daily Sreenath, Sudheer B, MD   10 mg at 11/07/23 0809   gabapentin  (NEURONTIN ) capsule 100 mg  100 mg Oral BID Sreenath, Sudheer B, MD   100 mg at 11/07/23 0809   heparin  ADULT infusion 100 units/mL (25000 units/250mL)  1,350 Units/hr Intravenous Continuous Niels Kayla FALCON, RPH 13.5 mL/hr at 11/07/23 0959 1,350 Units/hr at 11/07/23 0959   ipratropium-albuterol  (DUONEB) 0.5-2.5 (3) MG/3ML nebulizer solution 3 mL  3 mL Nebulization Q6H Cleatus Delayne V, MD   3 mL at 11/07/23 0747   latanoprost  (XALATAN ) 0.005 % ophthalmic solution 1 drop  1 drop Both Eyes QHS Sreenath, Sudheer B, MD   1 drop at 11/06/23 2304   melatonin tablet 5 mg  5 mg Oral QHS Sreenath, Sudheer B, MD   5 mg at 11/06/23 2212   memantine  (NAMENDA ) tablet 10 mg  10 mg Oral BID Sreenath, Sudheer B, MD   10 mg at 11/07/23 0809   multivitamin with minerals tablet 1 tablet  1 tablet Oral Daily Sreenath, Sudheer B, MD   1 tablet at 11/07/23 0813   ondansetron  (ZOFRAN ) tablet 4 mg  4 mg Oral Q6H PRN Duncan, [place]el V, MD       Or   ondansetron  (ZOFRAN ) injection 4 mg  4 mg Intravenous Q6H PRN Duncan, Hazel V, MD   4 mg at 11/05/23 1504   Oral care mouth rinse  15 mL Mouth Rinse 4 times per day Cleatus Delayne GAILS, MD   15 mL at 11/07/23 1132   Oral care mouth rinse  15 mL Mouth Rinse PRN Cleatus Delayne GAILS, MD       pantoprazole  (PROTONIX ) EC tablet 40 mg  40 mg Oral Daily Sreenath, Sudheer B, MD   40  mg at 11/07/23 0809   umeclidinium-vilanterol (ANORO ELLIPTA ) 62.5-25 MCG/ACT 1 puff  1 puff Inhalation Daily Sreenath, Sudheer B, MD   1 puff at 11/07/23 0810   zolpidem  (AMBIEN ) tablet 5 mg  5 mg Oral QHS Sreenath, Sudheer B, MD   5 mg at 11/06/23 2212     Discharge Medications: Please see discharge summary for a list of discharge medications.  Relevant Imaging Results:  Relevant Lab Results:   Additional Information SSN:  [id number]  Alvaro Louder, LCSW

## 2023-11-07 NOTE — Progress Notes (Signed)
 Occupational Therapy Treatment Patient Details Name: Rachel [name] MRN: [id number] DOB: October 26, 1945 Today's Date: 11/07/2023   History of present illness Pt is a 78 y.o. female presented on 11/04/23 by EMS they received a call for acute respiratory distress and a fever. On arrival pt was febrile, tachypneic, and had rapid A-fib being admitted with multiple acute medical issues including worsening respiratory failure secondary to COPD and CHF, pneumonia and new onset A-fib with RVR. PMH: COPD and chronic respiratory failure on home O2 at 3 to 4 L, HTN, morbid obesity, history of cochlear implants   OT comments  Pt is supine in bed on arrival. Resting, but easily arousable and agreeable to OT session. She denies pain throughout session until the end when IV in her L hand began being utilized by [employer] d/t other one bleeding/needing to be cleaned-nurse addressing on OT/PT exit. Pt performed bed mobility with CGAx2 to reach EOB and Mod A x2 for BLE management to return to supine. She progressed seated tolerance at EOB to 11 mins this date with ability to wash her face with set up assist and CGA for seated balance. Pt engaged in reaching tasks and BLE exercises while seated EOB with sp02 87-93% on 12L HFNC. HR up to 142 max with quick improvement to low 100s. She remains very weak and far from her baseline. Pt returned to bed with all needs in place and will cont to require skilled acute OT services to maximize her safety and IND to return to PLOF.       If plan is discharge home, recommend the following:  A lot of help with bathing/dressing/bathroom;A lot of help with walking and/or transfers;Two people to help with walking and/or transfers   Equipment Recommendations  Other (comment) (defer to next venue)    Recommendations for Other Services      Precautions / Restrictions Precautions Precautions: Fall Recall of Precautions/Restrictions: Impaired Precaution/Restrictions Comments: HFNC,  HR Restrictions Weight Bearing Restrictions Per Provider Order: No       Mobility Bed Mobility Overal bed mobility: Needs Assistance Bed Mobility: Supine to Sit, Sit to Supine     Supine to sit: Contact guard, +2 for physical assistance, HOB elevated Sit to supine: HOB elevated, Mod assist, +2 for safety/equipment   General bed mobility comments: increased time/effort to reach EOB with CGA x2 for safety; did need Mod A x2 for safety with BLE management to return to supine and Max A X2 for repositioning to [facility]    Transfers                   General transfer comment: Deferred due to medical status/breathing condition     Balance Overall balance assessment: Needs assistance Sitting-balance support: Single extremity supported, Feet unsupported, Bilateral upper extremity supported Sitting balance-Leahy Scale: Fair                                     ADL either performed or assessed with clinical judgement   ADL Overall ADL's : Needs assistance/impaired     Grooming: Wash/dry face;Sitting;Set up Grooming Details (indicate cue type and reason): while seated EOB with CGA for balance                               General ADL Comments: reaching tasks with LUE at EOB with CGA for balance  Extremity/Trunk Assessment              Occupational psychologist Communication: Impaired Factors Affecting Communication: Hearing impaired (soft spoken)   Cognition Arousal: Alert Behavior During Therapy: WFL for tasks assessed/performed                                 Following commands: Intact        Cueing   Cueing Techniques: Verbal cues, Gestural cues, Tactile cues  Exercises      Shoulder Instructions       General Comments pt on 8L HFNC at rest on entry with sp02 at 92%, increased to 10L to prepare for activity, however dropped to 86% in bed therefore increased to 12L  for session and maintain 87% and above throughout; BP Stable and HR max of 142 very briefly    Pertinent Vitals/ Pain       Pain Assessment Pain Assessment: Faces Faces Pain Scale: Hurts a little bit Pain Location: left hand at IV site d/t new use-nurse addressing at end of session Pain Descriptors / Indicators: Aching Pain Intervention(s): Monitored during session, Limited activity within patient's tolerance, Repositioned  Home Living                                          Prior Functioning/Environment              Frequency  Min 2X/week        Progress Toward Goals  OT Goals(current goals can now be found in the care plan section)  Progress towards OT goals: Progressing toward goals  Acute Rehab OT Goals Patient Stated Goal: improve breathing OT Goal Formulation: With patient Time For Goal Achievement: 11/20/23 Potential to Achieve Goals: Fair  Plan      Co-evaluation    PT/OT/SLP Co-Evaluation/Treatment: Yes Reason for Co-Treatment: Complexity of the patient's impairments (multi-system involvement);For patient/therapist safety PT goals addressed during session: Mobility/safety with mobility;Balance OT goals addressed during session: ADL's and self-care      AM-PAC OT 6 Clicks Daily Activity     Outcome Measure   Help from another person eating meals?: None Help from another person taking care of personal grooming?: A Little Help from another person toileting, which includes using toliet, bedpan, or urinal?: A Lot Help from another person bathing (including washing, rinsing, drying)?: A Lot Help from another person to put on and taking off regular upper body clothing?: A Lot Help from another person to put on and taking off regular lower body clothing?: Total 6 Click Score: 14    End of Session Equipment Utilized During Treatment: Oxygen (HFNC)  OT Visit Diagnosis: Other abnormalities of gait and mobility (R26.89);Muscle weakness  (generalized) (M62.81)   Activity Tolerance Patient tolerated treatment well;Patient limited by fatigue   Patient Left in bed;with call bell/phone within reach;with bed alarm set   Nurse Communication Mobility status        Time: 8651-8585 OT Time Calculation (min): 26 min  Charges: OT General Charges $OT Visit: 1 Visit OT Treatments $Self Care/Home Management : 8-22 mins  Johnda Billiot, OTR/L  11/07/23, 4:11 PM   Revanth Neidig E Alder Murri 11/07/2023, 4:06 PM

## 2023-11-07 NOTE — TOC Progression Note (Signed)
 Transition of Care [facility]) - Progression Note    Patient Details  Name: Rachel [name] MRN: [id number] Date of Birth: [birth year]  Transition [facility]) CM/SW Contact  Alvaro Louder, [place] Phone Number: 11/07/2023, 11:57 AM  Clinical Narrative:   FL2 Completed, sent to [facility].   TOC to follow for discharge planning    Expected Discharge Plan: Skilled Nursing Facility Barriers to Discharge: Continued Medical Work up  Expected Discharge Plan and Services   Discharge Planning Services: CM Consult Post Acute Care Choice: Skilled Nursing Facility Living arrangements for the past 2 months: Skilled Nursing Facility                                       Social Determinants of Health (SDOH) Interventions SDOH Screenings   Food Insecurity: Patient Unable To Answer (11/05/2023)  Housing: Unknown (11/05/2023)  Transportation Needs: Patient Unable To Answer (11/05/2023)  Utilities: Patient Unable To Answer (11/05/2023)  Social Connections: Patient Unable To Answer (11/05/2023)  Tobacco Use: Medium Risk (11/04/2023)    Readmission Risk Interventions     No data to display

## 2023-11-07 NOTE — Progress Notes (Signed)
 PROGRESS NOTE    Rachel [name]  FMW:[id number] DOB: 10/09/1945 DOA: 11/04/2023 PCP: Corlis Honor [name], MD    Brief Narrative:   78 y.o. female with medical history significant for COPD and chronic respiratory failure on home O2 at 3 to 4 L, HTN, morbid obesity, history of cochlear implants being admitted with multiple acute medical issues including worsening respiratory failure secondary to COPD and CHF, pneumonia and new onset A-fib with RVR.  She is currently on BiPAP.  She presented by EMS with CPAP in place after they received a call for acute respiratory distress and a fever.  With EMS she received IV Tylenol , Solu-Medrol , DuoNebs and an IV diltiazem  push.    Assessment & Plan:   Principal Problem:   Acute respiratory failure with hypoxia (HCC) Active Problems:   Acute on chronic respiratory failure with hypoxemia (HCC)   CAP (community acquired pneumonia)   Sepsis (HCC)   COPD exacerbation (HCC)   Atrial fibrillation with rapid ventricular response, new onset (HCC)   Acute on chronic diastolic CHF (congestive heart failure) (HCC)   Hyponatremia   Essential hypertension   Obesity, Class III, BMI 40-49.9 (morbid obesity)   Acute on chronic congestive heart failure (HCC)   Hypoxia  Acute on chronic respiratory failure with hypoxemia (HCC) Secondary to CAP/sepsis, and CHF likely triggered by rapid A-fib PE among the differentials but lower suspicion.  Renal function precludes CTA. Required BiPAP at night.  Weaned off to 10 L high flow during the day Plan:  Continue high flow oxygen.  Wean as tolerated.  BiPAP as needed for naps and at night.  Diuresis on hold for now.  Continue stepdown status for now.  Can likely transfer to PCU within the next 24 hours   CAP (community acquired pneumonia) Sepsis COPD exacerbation Sepsis criteria include fever, tachycardia and tachypnea with leukocytosis and lactic acidosis, airspace disease on chest x-ray Received sepsis fluid bolus in  the ED Plan: Continue antibiotics Hold fluids Continue bronchodilators Can consider IV steroid if unable to wean off high flow Monitor vitals and fever curve  Atrial fibrillation with rapid ventricular response, new onset (HCC) Heart rate improved.  Remains on diltiazem  infusion. Plan: DC diltiazem  gtt.  Start p.o. diltiazem .  Continue IV heparin  for now.  Acute on chronic diastolic CHF (congestive heart failure) (HCC) BNP mildly elevated.  Chest x-ray with edema.  Received a dose of IV Lasix  in ED. Plan: Lasix  on hold.  Cardiology following   Hyponatremia Suspect hypervolemic hyponatremia in the setting of decompensated heart failure.  IV diuresis as above. Appears stable.  Continue daily renal function   Obesity, Class III, BMI 40-49.9 (morbid obesity) Complicating factor   Essential hypertension Diltiazem  as above   DVT prophylaxis: IV heparin  Code Status: DNR Family Communication: Sister via phone 6/25, 6/26 Disposition Plan: Status is: Inpatient Remains inpatient appropriate because: Multiple acute issues as above   Level of care: Stepdown  Consultants:  Cardiology-[facility]  Procedures:  None  Antimicrobials: Rocephin  Azithromycin    Subjective: Seen and examined.  Remains on high flow nasal cannula 10 L  Objective: Vitals:   11/07/23 0800 11/07/23 0900 11/07/23 1000 11/07/23 1100  BP: (!) 124/59 126/87 117/70 (!) 118/100  Pulse: 84 94 (!) 48 90  Resp: (!) 21 (!) 24 (!) 22 17  Temp:      TempSrc:      SpO2: 96% (!) 89% 91% 92%  Weight:      Height:  Intake/Output Summary (Last 24 hours) at 11/07/2023 1133 Last data filed at 11/07/2023 0959 Gross per 24 hour  Intake 1250.87 ml  Output 2225 ml  Net -974.13 ml   Filed Weights   11/05/23 0403 11/06/23 0500 11/07/23 0140  Weight: 123.9 kg 125.6 kg 125 kg    Examination:  General exam: No acute distress.  Appears fatigued Respiratory system: No basilar crackles.  Scattered wheeze.  Normal  work of breathing.  10 L Cardiovascular system: S1-S2, regular rate, irregular rhythm, no murmurs, no pedal edema Gastrointestinal system: Soft, NT/ND, normal bowel sounds Central nervous system: Alert and oriented. No focal neurological deficits. Extremities: Symmetric 5 x 5 power. Skin: No rashes, lesions or ulcers Psychiatry: Judgement and insight appear normal. Mood & affect appropriate.     Data Reviewed: I have personally reviewed following labs and imaging studies  CBC: Recent Labs  Lab 11/04/23 2114 11/05/23 0326 11/06/23 0423 11/07/23 0523  WBC 17.1* 14.7* 13.3* 13.4*  NEUTROABS 14.1*  --   --   --   HGB 12.0 10.9* 10.4* 10.1*  HCT 36.5 33.9* 32.1* 31.0*  MCV 94.6 94.4 93.9 93.7  PLT 217 174 194 209   Basic Metabolic Panel: Recent Labs  Lab 11/04/23 2114 11/05/23 0326 11/06/23 0423 11/07/23 0523  NA 129* 133* 135 136  K 3.8 3.8 3.4* 3.4*  CL 95* 101 100 102  CO2 21* 23 24 25   GLUCOSE 160* 201* 137* 114*  BUN 58* 61* 73* 76*  CREATININE 1.62* 1.65* 1.66* 1.81*  CALCIUM  9.1 8.6* 8.9 9.0  MG  --   --  2.3 2.3   GFR: Estimated Creatinine Clearance: 34.6 mL/min (A) (by C-G formula based on SCr of 1.81 mg/dL (H)). Liver Function Tests: Recent Labs  Lab 11/04/23 2114  AST 23  ALT 14  ALKPHOS 67  BILITOT 1.1  PROT 7.8  ALBUMIN 3.3*   No results for input(s): LIPASE, AMYLASE in the last 168 hours. No results for input(s): AMMONIA in the last 168 hours. Coagulation Profile: Recent Labs  Lab 11/04/23 2114  INR 1.3*   Cardiac Enzymes: No results for input(s): CKTOTAL, CKMB, CKMBINDEX, TROPONINI in the last 168 hours. BNP (last 3 results) No results for input(s): PROBNP in the last 8760 hours. HbA1C: No results for input(s): HGBA1C in the last 72 hours. CBG: Recent Labs  Lab 11/05/23 0027  GLUCAP 189*   Lipid Profile: No results for input(s): CHOL, HDL, LDLCALC, TRIG, CHOLHDL, LDLDIRECT in the last 72  hours. Thyroid Function Tests: Recent Labs    11/07/23 0623  TSH 0.848   Anemia Panel: No results for input(s): VITAMINB12, FOLATE, FERRITIN, TIBC, IRON, RETICCTPCT in the last 72 hours. Sepsis Labs: Recent Labs  Lab 11/04/23 2114 11/04/23 2300 11/05/23 0146 11/05/23 0326  PROCALCITON  --  3.76  --   --   LATICACIDVEN 2.6* 2.7* 2.7* 2.0*    Recent Results (from the past 240 hours)  Resp panel by RT-PCR (RSV, Flu A&B, Covid) Anterior Nasal Swab     Status: None   Collection Time: 11/04/23  9:14 PM   Specimen: Anterior Nasal Swab  Result Value Ref Range Status   SARS Coronavirus 2 by RT PCR NEGATIVE NEGATIVE Final    Comment: (NOTE) SARS-CoV-2 target nucleic acids are NOT DETECTED.  The SARS-CoV-2 RNA is generally detectable in upper respiratory specimens during the acute phase of infection. The lowest concentration of SARS-CoV-2 viral copies this assay can detect is 138 copies/mL. A negative result does not  preclude SARS-Cov-2 infection and should not be used as the sole basis for treatment or other patient management decisions. A negative result may occur with  improper specimen collection/handling, submission of specimen other than nasopharyngeal swab, presence of viral mutation(s) within the areas targeted by this assay, and inadequate number of viral copies(<138 copies/mL). A negative result must be combined with clinical observations, patient history, and epidemiological information. The expected result is Negative.  Fact Sheet for Patients:  BloggerCourse.com  Fact Sheet for Healthcare Providers:  SeriousBroker.it  This test is no t yet approved or cleared by the [place]  FDA and  has been authorized for detection and/or diagnosis of SARS-CoV-2 by FDA under an Emergency Use Authorization (EUA). This EUA will remain  in effect (meaning this test can be used) for the duration of the COVID-19  declaration under Section 564(b)(1) of the Act, 21 U.S.C.section 360bbb-3(b)(1), unless the authorization is terminated  or revoked sooner.       Influenza A by PCR NEGATIVE NEGATIVE Final   Influenza B by PCR NEGATIVE NEGATIVE Final    Comment: (NOTE) The Xpert Xpress SARS-CoV-2/FLU/RSV plus assay is intended as an aid in the diagnosis of influenza from Nasopharyngeal swab specimens and should not be used as a sole basis for treatment. Nasal washings and aspirates are unacceptable for Xpert Xpress SARS-CoV-2/FLU/RSV testing.  Fact Sheet for Patients: BloggerCourse.com  Fact Sheet for Healthcare Providers: SeriousBroker.it  This test is not yet approved or cleared by the [place]  FDA and has been authorized for detection and/or diagnosis of SARS-CoV-2 by FDA under an Emergency Use Authorization (EUA). This EUA will remain in effect (meaning this test can be used) for the duration of the COVID-19 declaration under Section 564(b)(1) of the Act, 21 U.S.C. section 360bbb-3(b)(1), unless the authorization is terminated or revoked.     Resp Syncytial Virus by PCR NEGATIVE NEGATIVE Final    Comment: (NOTE) Fact Sheet for Patients: BloggerCourse.com  Fact Sheet for Healthcare Providers: SeriousBroker.it  This test is not yet approved or cleared by the [place]  FDA and has been authorized for detection and/or diagnosis of SARS-CoV-2 by FDA under an Emergency Use Authorization (EUA). This EUA will remain in effect (meaning this test can be used) for the duration of the COVID-19 declaration under Section 564(b)(1) of the Act, 21 U.S.C. section 360bbb-3(b)(1), unless the authorization is terminated or revoked.  Performed at [facility], [address] [place], [place] [address]   Blood Culture (routine x 2)     Status: None (Preliminary result)   Collection  Time: 11/04/23  9:14 PM   Specimen: BLOOD  Result Value Ref Range Status   Specimen Description BLOOD BLOOD RIGHT ARM  Final   Special Requests   Final    BOTTLES DRAWN AEROBIC AND ANAEROBIC Blood Culture adequate volume   Culture   Final    NO GROWTH 3 DAYS Performed at [facility], [address], [place], [place] [address]    Report Status PENDING  Incomplete  Blood Culture (routine x 2)     Status: None (Preliminary result)   Collection Time: 11/04/23  9:15 PM   Specimen: BLOOD  Result Value Ref Range Status   Specimen Description BLOOD BLOOD LEFT ARM  Final   Special Requests   Final    BOTTLES DRAWN AEROBIC AND ANAEROBIC Blood Culture results may not be optimal due to an inadequate volume of blood received in culture bottles   Culture   Final  NO GROWTH 3 DAYS Performed at [facility], [address] [place], [place] [address]    Report Status PENDING  Incomplete  MRSA Next Gen by PCR, Nasal     Status: None   Collection Time: 11/05/23  1:04 AM   Specimen: Nasal Mucosa; Nasal Swab  Result Value Ref Range Status   MRSA by PCR Next Gen NOT DETECTED NOT DETECTED Final    Comment: (NOTE) The GeneXpert MRSA Assay (FDA approved for NASAL specimens only), is one component of a comprehensive MRSA colonization surveillance program. It is not intended to diagnose MRSA infection nor to guide or monitor treatment for MRSA infections. Test performance is not FDA approved in patients less than 39 years old. Performed at [facility], [address] [place], [place] [address]          Radiology Studies: [facility] Chest [place] 1 View Result Date: 11/07/2023 CLINICAL DATA:  [id number] Hypoxia [id number]. EXAM: PORTABLE CHEST 1 VIEW COMPARISON:  11/04/2023. FINDINGS: Redemonstration of coarse interstitial pattern with hyperlucency of the lung fields, asymmetrically involving the right mid lung zone, concerning for underlying COPD changes. There are stable  heterogeneous nonspecific opacities overlying bilateral mid lower lung zones without significant interval change. These may represent combination of atelectasis/scarring and pneumonitis. Correlate clinically. No pneumothorax on either side. There is subtle blunting of bilateral lateral costophrenic angles which may represent bilateral trace pleural effusions. Stable cardio-mediastinal silhouette. No acute osseous abnormalities. The soft tissues are within normal limits. IMPRESSION: No significant interval change since the prior study. Electronically Signed   By: Ree Molt M.D.   On: 11/07/2023 08:32   ECHOCARDIOGRAM COMPLETE Result Date: 11/05/2023    ECHOCARDIOGRAM REPORT   Patient Name:   Rachel [name] Date of Exam: 11/05/2023 Medical Rec #:  [id number]          Height:       66.0 in Accession #:    [id number]         Weight:       273.1 lb Date of Birth:  1945-06-03         BSA:          2.283 m Patient Age:    10 years           BP:           115/63 mmHg Patient Gender: F                  HR:           99 bpm. Exam Location:  [facility] Procedure: 2D Echo, Cardiac Doppler, Color Doppler and Intracardiac            Opacification Agent (Both Spectral and Color Flow Doppler were            utilized during procedure). Indications:     CHF-Acute Diastolic I50.31  History:         Patient has prior history of Echocardiogram examinations, most                  recent 11/16/2021. CHF.  Sonographer:     Rosina Dunk Referring Phys:  [id number] DELAYNE LULLA SOLIAN Diagnosing Phys: Evalene Lunger MD IMPRESSIONS  1. Left ventricular ejection fraction, by estimation, is 60 to 65%. The left ventricle has normal function. The left ventricle has no regional wall motion abnormalities. Left ventricular diastolic parameters are indeterminate.  2. Right ventricular systolic function is normal. The right ventricular size is normal. There  is normal pulmonary artery systolic pressure. The estimated right ventricular systolic  pressure is 27.8 mmHg.  3. The mitral valve is normal in structure. No evidence of mitral valve regurgitation. No evidence of mitral stenosis.  4. The aortic valve is normal in structure. Aortic valve regurgitation is not visualized. No aortic stenosis is present.  5. The inferior vena cava is normal in size with greater than 50% respiratory variability, suggesting right atrial pressure of 3 mmHg. FINDINGS  Left Ventricle: Left ventricular ejection fraction, by estimation, is 60 to 65%. The left ventricle has normal function. The left ventricle has no regional wall motion abnormalities. Definity  contrast agent was given IV to delineate the left ventricular  endocardial borders. Strain was performed and the global longitudinal strain is indeterminate. The left ventricular internal cavity size was normal in size. There is no left ventricular hypertrophy. Left ventricular diastolic parameters are indeterminate. Right Ventricle: The right ventricular size is normal. No increase in right ventricular wall thickness. Right ventricular systolic function is normal. There is normal pulmonary artery systolic pressure. The tricuspid regurgitant velocity is 2.39 m/s, and  with an assumed right atrial pressure of 5 mmHg, the estimated right ventricular systolic pressure is 27.8 mmHg. Left Atrium: Left atrial size was normal in size. Right Atrium: Right atrial size was normal in size. Pericardium: There is no evidence of pericardial effusion. Mitral Valve: The mitral valve is normal in structure. There is mild calcification of the mitral valve leaflet(s). No evidence of mitral valve regurgitation. No evidence of mitral valve stenosis. MV peak gradient, 9.4 mmHg. The mean mitral valve gradient  is 4.0 mmHg. Tricuspid Valve: The tricuspid valve is normal in structure. Tricuspid valve regurgitation is not demonstrated. No evidence of tricuspid stenosis. Aortic Valve: The aortic valve is normal in structure. Aortic valve regurgitation  is not visualized. No aortic stenosis is present. Aortic valve mean gradient measures 5.0 mmHg. Aortic valve peak gradient measures 8.5 mmHg. Aortic valve area, by VTI measures 2.78 cm. Pulmonic Valve: The pulmonic valve was normal in structure. Pulmonic valve regurgitation is not visualized. No evidence of pulmonic stenosis. Aorta: The aortic root is normal in size and structure. Venous: The inferior vena cava is normal in size with greater than 50% respiratory variability, suggesting right atrial pressure of 3 mmHg. IAS/Shunts: No atrial level shunt detected by color flow Doppler. Additional Comments: 3D was performed not requiring image post processing on an independent workstation and was indeterminate.  LEFT VENTRICLE PLAX 2D LVIDd:         4.95 cm     Diastology LVIDs:         3.90 cm     LV e' medial:    8.27 cm/s LV PW:         1.25 cm     LV E/e' medial:  19.1 LV IVS:        1.05 cm     LV e' lateral:   13.70 cm/s LVOT diam:     2.10 cm     LV E/e' lateral: 11.5 LV SV:         57 LV SV Index:   25 LVOT Area:     3.46 cm  LV Volumes (MOD) LV vol d, MOD A4C: 51.2 ml LV vol s, MOD A4C: 34.1 ml LV SV MOD A4C:     51.2 ml RIGHT VENTRICLE RV Basal diam:  4.30 cm RV Mid diam:    3.30 cm RV S prime:  17.10 cm/s TAPSE (M-mode): 1.8 cm LEFT ATRIUM             Index        RIGHT ATRIUM           Index LA Vol (A2C):   52.2 ml 22.86 ml/m  RA Area:     17.80 cm LA Vol (A4C):   60.6 ml 26.54 ml/m  RA Volume:   50.10 ml  21.94 ml/m LA Biplane Vol: 62.4 ml 27.33 ml/m  AORTIC VALVE AV Area (Vmax):    2.30 cm AV Area (Vmean):   2.31 cm AV Area (VTI):     2.78 cm AV Vmax:           146.00 cm/s AV Vmean:          98.500 cm/s AV VTI:            0.207 m AV Peak Grad:      8.5 mmHg AV Mean Grad:      5.0 mmHg LVOT Vmax:         96.90 cm/s LVOT Vmean:        65.700 cm/s LVOT VTI:          0.166 m LVOT/AV VTI ratio: 0.80  AORTA Ao Root diam: 3.30 cm MITRAL VALVE                TRICUSPID VALVE MV Area (PHT): 2.34 cm      TR Peak grad:   22.8 mmHg MV Area VTI:   1.80 cm     TR Mean grad:   17.0 mmHg MV Peak grad:  9.4 mmHg     TR Vmax:        239.00 cm/s MV Mean grad:  4.0 mmHg     TR Vmean:       202.0 cm/s MV Vmax:       1.53 m/s MV Vmean:      94.9 cm/s    SHUNTS MV Decel Time: 324 msec     Systemic VTI:  0.17 m MR Peak grad: 82.1 mmHg     Systemic Diam: 2.10 cm MR Vmax:      453.00 cm/s MV E velocity: 158.00 cm/s Evalene Lunger MD Electronically signed by Evalene Lunger MD Signature Date/Time: 11/05/2023/2:40:23 PM    Final         Scheduled Meds:  acidophilus  1 capsule Oral BID   Chlorhexidine  Gluconate Cloth  6 each Topical Q0600   diltiazem   60 mg Oral Q6H   feeding supplement  237 mL Oral BID BM   FLUoxetine   10 mg Oral Daily   gabapentin   100 mg Oral BID   ipratropium-albuterol   3 mL Nebulization Q6H   latanoprost   1 drop Both Eyes QHS   melatonin  5 mg Oral QHS   memantine   10 mg Oral BID   multivitamin with minerals  1 tablet Oral Daily   mouth rinse  15 mL Mouth Rinse 4 times per day   pantoprazole   40 mg Oral Daily   umeclidinium-vilanterol  1 puff Inhalation Daily   zolpidem   5 mg Oral QHS   Continuous Infusions:  ceFEPime  (MAXIPIME ) IV Stopped (11/07/23 0851)   heparin  1,350 Units/hr (11/07/23 0959)   vancomycin  Stopped (11/06/23 2101)     LOS: 3 days     Calvin KATHEE Robson, MD [facility]   If 7PM-7AM, please contact night-coverage  11/07/2023, 11:33 AM

## 2023-11-07 NOTE — Progress Notes (Signed)
 Physical Therapy Treatment Patient Details Name: Rachel [name] MRN: [id number] DOB: May 26, 1945 Today's Date: 11/07/2023   History of Present Illness Pt is a 78 y.o. female presented on 11/04/23 by EMS they received a call for acute respiratory distress and a fever. On arrival pt was febrile, tachypneic, and had rapid A-fib being admitted with multiple acute medical issues including worsening respiratory failure secondary to COPD and CHF, pneumonia and new onset A-fib with RVR. PMH: COPD and chronic respiratory failure on home O2 at 3 to 4 L, HTN, morbid obesity, history of cochlear implants    PT Comments  PT session performed with OT to promote pt/staff safety and manage line/lead/tubes. Pt is supine in bed with HOB elevated, and agreeable to therapy upon entry; pt was on 8 L O2 via HFNC at beginning of session. PT focused today's session on progressing exercise tolerance and increasing endurance. Due to concerns of desaturation with increased exercise activity, PT/OT staff increased pt's O2 to 12 L throughout session to optimize treatment (discussed with RN prior to start of session). During session pt was able to perform bed mobility (supine to sit) with 2+ CGA, performed BLE strengthening exercises and was able to maintain dynamic seated balance for a total of 11 minutes ( performed reaching within BOS with LUE and performed face hygiene with RUE).This is a significant improvement from yesterdays session, increasing exercise tolerance in sitting, despite increased O2 titration. Following end of session, pt requires 2+ mod A (for BLE/ truncal management) to get back into bed, and required 2+ Max A to reposition towards HOB.  PT continuously monitored vitals throughout session with highest recorded HR at 142 bpm with transition to seated position and SpO2 ranging from 87-94% on 12 L O2 via HFNC. Due to noted small bleeding from L hand iv site, PT contacted nurse. Pt was left on 12 L O2 via HFNC (Nurse  aware), in the presence of the nurse tending/repositioning IV. Pt would benefit from skilled PT to continue to work towards PT goals and return to PLOF.     If plan is discharge home, recommend the following: Two people to help with walking and/or transfers;A lot of help with bathing/dressing/bathroom;Assistance with cooking/housework;Assist for transportation;Help with stairs or ramp for entrance   Can travel by private vehicle     No  Equipment Recommendations       Recommendations for Other Services       Precautions / Restrictions Precautions Precautions: Fall Recall of Precautions/Restrictions: Impaired Restrictions Weight Bearing Restrictions Per Provider Order: No     Mobility  Bed Mobility Overal bed mobility: Needs Assistance Bed Mobility: Supine to Sit, Sit to Supine     Supine to sit: Contact guard, +2 for physical assistance, HOB elevated Sit to supine: HOB elevated, Mod assist, +2 for safety/equipment (for BLE management following session)   General bed mobility comments: Increased time to sit EOB, feet unsupported, required 2+ MAX A for repositioning once supine in bed.    Transfers                   General transfer comment: Deferred due to medical status    Ambulation/Gait               General Gait Details: Deferred due to medical status   Stairs             Wheelchair Mobility     Tilt Bed    Modified Rankin (Stroke Patients Only)  Balance Overall balance assessment: Needs assistance Sitting-balance support: Single extremity supported, Feet unsupported, Bilateral upper extremity supported Sitting balance-Leahy Scale: Fair Sitting balance - Comments: Steady static seated balance, reaching within BOS       Standing balance comment: Deferred                            Communication Communication Communication: Impaired Factors Affecting Communication: Hearing impaired (soft spoken)  Cognition  Arousal: Alert Behavior During Therapy: WFL for tasks assessed/performed   PT - Cognitive impairments: No apparent impairments                       PT - Cognition Comments: Very pleasant Following commands: Intact      Cueing Cueing Techniques: Verbal cues, Gestural cues, Tactile cues  Exercises General Exercises - Lower Extremity Ankle Circles/Pumps: AROM, Strengthening, Both, 10 reps (min resisted) Hip Flexion/Marching: AROM, Strengthening, Both, 5 reps, Seated    General Comments General comments (skin integrity, edema, etc.): Vitals monitored throughout session. Pre session HR: 95-106 bpm, Spo2: 92-93% on 8 L O2 via HFNC, RR:25, BP:(120/62)(83) supine, With seated activity highest recorded HR: 142 bpm, Spo2: 87-94% on 12 L O2 via HFNC, RR:22-28,BP:(122/66)(74) seated. Post session HR: 99-120 bpm, Spo2: 89-91+% on 12 L O2 via HFNC, RR: 22.      Pertinent Vitals/Pain Pain Assessment Pain Assessment: Faces Faces Pain Scale: Hurts a little bit (pain on L hand due to IV placement, nurse addressing at the end of session.) Pain Location: Left hand Pain Descriptors / Indicators: Aching Pain Intervention(s): Limited activity within patient's tolerance, Monitored during session, Other (comment) (Nurse attending patient at the end of session)    Home Living                          Prior Function            PT Goals (current goals can now be found in the care plan section) Acute Rehab PT Goals Patient Stated Goal: To get stronger and return home PT Goal Formulation: With patient Time For Goal Achievement: 11/20/23 Potential to Achieve Goals: Fair Progress towards PT goals: Progressing toward goals    Frequency    Min 2X/week      PT Plan      Co-evaluation PT/OT/SLP Co-Evaluation/Treatment: Yes Reason for Co-Treatment: Complexity of the patient's impairments (multi-system involvement);For patient/therapist safety PT goals addressed during session:  Mobility/safety with mobility;Balance OT goals addressed during session: ADL's and self-care      AM-PAC PT 6 Clicks Mobility   Outcome Measure  Help needed turning from your back to your side while in a flat bed without using bedrails?: A Little Help needed moving from lying on your back to sitting on the side of a flat bed without using bedrails?: A Little Help needed moving to and from a bed to a chair (including a wheelchair)?: A Little Help needed standing up from a chair using your arms (e.g., wheelchair or bedside chair)?: A Lot Help needed to walk in hospital room?: Total Help needed climbing 3-5 steps with a railing? : Total 6 Click Score: 13    End of Session Equipment Utilized During Treatment: Oxygen Activity Tolerance: Patient limited by fatigue;Treatment limited secondary to medical complications (Comment) Patient left: in bed;with call bell/phone within reach;with bed alarm set Nurse Communication: Mobility status;Precautions PT Visit Diagnosis: Muscle weakness (generalized) (M62.81);Other abnormalities of  gait and mobility (R26.89)     Time: 8651-8585 PT Time Calculation (min) (ACUTE ONLY): 26 min  Charges:                           Hykeem Ojeda, SPT 11/07/23, 3:06 PM
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
## 2023-11-07 NOTE — Progress Notes (Signed)
 RT place patient on a bipap, but patient is refusing to wear the bipap now. Explained and educated patient the importance of wearing the Bipap, but patient still adamant. RT place patient back to 10L HFNC. No other concern at the moment. Plan of care continued.
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
## 2023-11-07 NOTE — Progress Notes (Signed)
 Progress Note  Patient Name: JHADA RISK Date of Encounter: 11/07/2023  Primary Cardiologist: [facility]  Subjective   Weaned from BiPAP to supplemental oxygen via nasal cannula. Renal function slightly worse trending from 73/1.66 to 76/1.81 this morning. Potassium 3.4. Remains in Afib with ventricular rates in the 80s to 90s bpm. Diltiazem  gtt down from 15 mg to 10 mg.   Inpatient Medications    Scheduled Meds:  acidophilus  1 capsule Oral BID   Chlorhexidine  Gluconate Cloth  6 each Topical Q0600   feeding supplement  237 mL Oral BID BM   FLUoxetine   10 mg Oral Daily   furosemide   40 mg Intravenous Q12H   gabapentin   100 mg Oral BID   ipratropium-albuterol   3 mL Nebulization Q6H   latanoprost   1 drop Both Eyes QHS   melatonin  5 mg Oral QHS   memantine   10 mg Oral BID   multivitamin with minerals  1 tablet Oral Daily   mouth rinse  15 mL Mouth Rinse 4 times per day   pantoprazole   40 mg Oral Daily   potassium chloride   40 mEq Oral Q6H   umeclidinium-vilanterol  1 puff Inhalation Daily   zolpidem   5 mg Oral QHS   Continuous Infusions:  ceFEPime  (MAXIPIME ) IV 2 g (11/07/23 0820)   diltiazem  (CARDIZEM ) infusion 10 mg/hr (11/07/23 0809)   heparin  1,350 Units/hr (11/07/23 0700)   vancomycin  Stopped (11/06/23 2101)   PRN Meds: acetaminophen  **OR** acetaminophen , albuterol , alum & mag hydroxide-simeth, ondansetron  **OR** ondansetron  (ZOFRAN ) IV, mouth rinse   Vital Signs    Vitals:   11/07/23 0500 11/07/23 0600 11/07/23 0700 11/07/23 0749  BP: (!) 103/50 127/79 (!) 140/86   Pulse: 92 90 95   Resp: 16 (!) 22 17   Temp:      TempSrc:      SpO2: 92% 92% 93% 94%  Weight:      Height:        Intake/Output Summary (Last 24 hours) at 11/07/2023 0830 Last data filed at 11/07/2023 0700 Gross per 24 hour  Intake 1074.95 ml  Output 2075 ml  Net -1000.05 ml   Filed Weights   11/05/23 0403 11/06/23 0500 11/07/23 0140  Weight: 123.9 kg 125.6 kg 125 kg     Telemetry    Atrial fibrillation rate 80s to 90s bpm - Personally Reviewed  ECG    No new tracings - Personally Reviewed  Physical Exam   GEN: Obesity, alert, no acute distress.   Neck: Unable to estimate JVD. Cardiac: Irregularly irregular, no murmurs, rubs, or gallops.  Respiratory: Clear, scattered Rales GI: Soft, nontender, non-distended.   MS: No edema; No deformity. Neuro:  Alert and oriented x 3; Nonfocal.  Psych: Normal affect.  Labs    Chemistry Recent Labs  Lab 11/04/23 2114 11/05/23 0326 11/06/23 0423 11/07/23 0523  NA 129* 133* 135 136  K 3.8 3.8 3.4* 3.4*  CL 95* 101 100 102  CO2 21* 23 24 25   GLUCOSE 160* 201* 137* 114*  BUN 58* 61* 73* 76*  CREATININE 1.62* 1.65* 1.66* 1.81*  CALCIUM  9.1 8.6* 8.9 9.0  PROT 7.8  --   --   --   ALBUMIN 3.3*  --   --   --   AST 23  --   --   --   ALT 14  --   --   --   ALKPHOS 67  --   --   --   BILITOT 1.1  --   --   --  GFRNONAA 32* 32* 31* 28*  ANIONGAP 13 9 11 9      Hematology Recent Labs  Lab 11/05/23 0326 11/06/23 0423 11/07/23 0523  WBC 14.7* 13.3* 13.4*  RBC 3.59* 3.42* 3.31*  HGB 10.9* 10.4* 10.1*  HCT 33.9* 32.1* 31.0*  MCV 94.4 93.9 93.7  MCH 30.4 30.4 30.5  MCHC 32.2 32.4 32.6  RDW 13.5 13.6 14.0  PLT 174 194 209    Cardiac EnzymesNo results for input(s): TROPONINI in the last 168 hours. No results for input(s): TROPIPOC in the last 168 hours.   BNP Recent Labs  Lab 11/04/23 2114  BNP 347.2*     DDimer No results for input(s): DDIMER in the last 168 hours.   Radiology    DG Chest Port 1 View Result Date: 11/04/2023 IMPRESSION: Bilateral lower lobe airspace opacities could reflect edema or infection. Electronically Signed   By: Franky Crease M.D.   On: 11/04/2023 21:36    Cardiac Studies   2D echo 11/05/2023: 1. Left ventricular ejection fraction, by estimation, is 60 to 65%. The  left ventricle has normal function. The left ventricle has no regional  wall motion  abnormalities. Left ventricular diastolic parameters are  indeterminate.   2. Right ventricular systolic function is normal. The right ventricular  size is normal. There is normal pulmonary artery systolic pressure. The  estimated right ventricular systolic pressure is 27.8 mmHg.   3. The mitral valve is normal in structure. No evidence of mitral valve  regurgitation. No evidence of mitral stenosis.   4. The aortic valve is normal in structure. Aortic valve regurgitation is  not visualized. No aortic stenosis is present.   5. The inferior vena cava is normal in size with greater than 50%  respiratory variability, suggesting right atrial pressure of 3 mmHg.   Patient Profile     Ms. Ayen Viviano is a 78 year old woman with history of COPD, chronic respiratory failure on 3 to 4 L nasal cannula, hypertension, morbid obesity presenting from nursing facility November 04, 2023 with respiratory distress, tachycardia, atrial fibrillation with RVR, on BiPAP  Assessment & Plan    Atrial fibrillation with RVR -Timing of onset unclear, prior EKG July 2023 normal sinus rhythm -Now that she is off BiPAP, transition from diltiazem  gtt 10 mg/hr to short-acting oral diltiazem  60 mg every 6 hours with goal of 1 hour overlap of pharmacotherapy  -Continue heparin  gtt with recommendation to transition to DOAC prior to discharge  -Last resort would be amiodarone infusion though would prefer not to use amiodarone as she has not been fully anticoagulated and there would be risk of conversion/stroke -Anticipate DCCV in the outpatient setting once her acute illness is improved and following uninterrupted anticoagulation for 3-4 weeks unless she has difficult to control rates, at which time we would need to pursue TEE-guided DCCV prior to discharge  -Check TSH   Acute respiratory distress -Improved In the setting of atrial fibrillation with RVR rate 180 bpm on arrival with CAP and COPD exacerbation  -Difficult  wean off BiPAP -With up trending renal function, hold IV Lasix  -On broad-spectrum antibiotics -IV fluids on hold   AKI: -Baseline unclear -Hold IV Lasix  today (already received this morning)  Hypokalemia: -Replete to goal 4.0 -Magnesium  at goal  Normocytic anemia: -Hgb stable    For questions or updates, please contact [facility] Please consult [email or web address] for contact info under Cardiology/STEMI.   Signed, Bernardino Bring, PA-C [facility]

## 2023-11-07 NOTE — Progress Notes (Signed)
   11/07/23 0950  Spiritual Encounters  Type of Visit Initial  Care provided to: Patient (Pt uses hearing aids)  Referral source Physician  Reason for visit Advance directives  OnCall Visit No  Spiritual Framework  Presenting Themes Goals in life/care  Interventions  Spiritual Care Interventions Made Established relationship of care and support;Compassionate presence;Decision-making support/facilitation  Intervention Outcomes  Outcomes Connection to spiritual care;Connection to values and goals of care  Advance Directives (For Healthcare)  Does Patient Have a Medical Advance Directive? Yes (Pt wants to change HC POA to sister instead of son)  Does patient want to make changes to medical advance directive? Yes (Inpatient - patient defers changing a medical advance directive at this time - Information given)  Type of Advance Directive  (Pt wants to wait until her sister gets here; instructed her to let the nurse know to call the chaplain when she's ready to sign)

## 2023-11-08 ENCOUNTER — Inpatient Hospital Stay

## 2023-11-08 DIAGNOSIS — R1011 Right upper quadrant pain: Secondary | ICD-10-CM

## 2023-11-08 DIAGNOSIS — I4891 Unspecified atrial fibrillation: Secondary | ICD-10-CM | POA: Diagnosis not present

## 2023-11-08 DIAGNOSIS — J9601 Acute respiratory failure with hypoxia: Secondary | ICD-10-CM | POA: Diagnosis not present

## 2023-11-08 LAB — CBC
HCT: 30.4 % — ABNORMAL LOW (ref 36.0–46.0)
Hemoglobin: 9.8 g/dL — ABNORMAL LOW (ref 12.0–15.0)
MCH: 30.2 pg (ref 26.0–34.0)
MCHC: 32.2 g/dL (ref 30.0–36.0)
MCV: 93.8 fL (ref 80.0–100.0)
Platelets: 222 10*3/uL (ref 150–400)
RBC: 3.24 MIL/uL — ABNORMAL LOW (ref 3.87–5.11)
RDW: 14.1 % (ref 11.5–15.5)
WBC: 12.2 10*3/uL — ABNORMAL HIGH (ref 4.0–10.5)
nRBC: 0.2 % (ref 0.0–0.2)

## 2023-11-08 LAB — HEPARIN LEVEL (UNFRACTIONATED)
Heparin Unfractionated: 0.51 [IU]/mL (ref 0.30–0.70)
Heparin Unfractionated: 0.7 [IU]/mL (ref 0.30–0.70)

## 2023-11-08 LAB — BASIC METABOLIC PANEL WITH GFR
Anion gap: 9 (ref 5–15)
BUN: 77 mg/dL — ABNORMAL HIGH (ref 8–23)
CO2: 22 mmol/L (ref 22–32)
Calcium: 9.1 mg/dL (ref 8.9–10.3)
Chloride: 102 mmol/L (ref 98–111)
Creatinine, Ser: 1.78 mg/dL — ABNORMAL HIGH (ref 0.44–1.00)
GFR, Estimated: 29 mL/min — ABNORMAL LOW (ref >60)
Glucose, Bld: 148 mg/dL — ABNORMAL HIGH (ref 70–99)
Potassium: 5 mmol/L (ref 3.5–5.1)
Sodium: 133 mmol/L — ABNORMAL LOW (ref 135–145)

## 2023-11-08 LAB — MAGNESIUM: Magnesium: 2.4 mg/dL (ref 1.7–2.4)

## 2023-11-08 MED ORDER — DILTIAZEM HCL ER COATED BEADS 120 MG PO CP24
240.0000 mg | ORAL_CAPSULE | Freq: Every day | ORAL | Status: DC
  Administered 2023-11-08 – 2023-11-09 (×2): 240 mg via ORAL
  Filled 2023-11-08 (×2): qty 2

## 2023-11-08 MED ORDER — DILTIAZEM HCL ER COATED BEADS 120 MG PO CP24
120.0000 mg | ORAL_CAPSULE | Freq: Once | ORAL | Status: AC
  Administered 2023-11-08: 120 mg via ORAL
  Filled 2023-11-08: qty 1
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
## 2023-11-08 NOTE — Care Management Important Message (Signed)
 Important Message  Patient Details  Name: Rachel [name] MRN: [id number] Date of Birth: Oct 03, 1945   Important Message Given:  Yes - Medicare IM     Cortlynn Hollinsworth W, CMA 11/08/2023, 11:41 AM

## 2023-11-08 NOTE — Plan of Care (Signed)
  Problem: Education: Goal: Knowledge of General Education information will improve Description: Including pain rating scale, medication(s)/side effects and non-pharmacologic comfort measures Outcome: Progressing   Problem: Health Behavior/Discharge Planning: Goal: Ability to manage health-related needs will improve Outcome: Progressing   Problem: Clinical Measurements: Goal: Ability to maintain clinical measurements within normal limits will improve Outcome: Progressing Goal: Will remain free from infection Outcome: Progressing Goal: Diagnostic test results will improve Outcome: Progressing Goal: Respiratory complications will improve Outcome: Progressing Goal: Cardiovascular complication will be avoided Outcome: Progressing   Problem: Activity: Goal: Risk for activity intolerance will decrease Outcome: Progressing   Problem: Nutrition: Goal: Adequate nutrition will be maintained Outcome: Progressing   Problem: Coping: Goal: Level of anxiety will decrease Outcome: Progressing   Problem: Elimination: Goal: Will not experience complications related to bowel motility Outcome: Progressing Goal: Will not experience complications related to urinary retention Outcome: Progressing   Problem: Pain Managment: Goal: General experience of comfort will improve and/or be controlled Outcome: Progressing   Problem: Safety: Goal: Ability to remain free from injury will improve Outcome: Progressing   Problem: Skin Integrity: Goal: Risk for impaired skin integrity will decrease Outcome: Progressing   Problem: Education: Goal: Knowledge of disease or condition will improve Outcome: Progressing Goal: Knowledge of the prescribed therapeutic regimen will improve Outcome: Progressing Goal: Individualized Educational Video(s) Outcome: Progressing   Problem: Activity: Goal: Ability to tolerate increased activity will improve Outcome: Progressing Goal: Will verbalize the  importance of balancing activity with adequate rest periods Outcome: Progressing   Problem: Respiratory: Goal: Ability to maintain a clear airway will improve Outcome: Progressing Goal: Levels of oxygenation will improve Outcome: Progressing Goal: Ability to maintain adequate ventilation will improve Outcome: Progressing   Problem: Education: Goal: Knowledge of disease or condition will improve Outcome: Progressing Goal: Understanding of medication regimen will improve Outcome: Progressing Goal: Individualized Educational Video(s) Outcome: Progressing   Problem: Activity: Goal: Ability to tolerate increased activity will improve Outcome: Progressing   Problem: Cardiac: Goal: Ability to achieve and maintain adequate cardiopulmonary perfusion will improve Outcome: Progressing   Problem: Health Behavior/Discharge Planning: Goal: Ability to safely manage health-related needs after discharge will improve Outcome: Progressing   Problem: Fluid Volume: Goal: Hemodynamic stability will improve Outcome: Progressing   Problem: Clinical Measurements: Goal: Diagnostic test results will improve Outcome: Progressing Goal: Signs and symptoms of infection will decrease Outcome: Progressing   Problem: Respiratory: Goal: Ability to maintain adequate ventilation will improve Outcome: Progressing   Problem: Education: Goal: Ability to demonstrate management of disease process will improve Outcome: Progressing Goal: Ability to verbalize understanding of medication therapies will improve Outcome: Progressing Goal: Individualized Educational Video(s) Outcome: Progressing   Problem: Activity: Goal: Capacity to carry out activities will improve Outcome: Progressing   Problem: Cardiac: Goal: Ability to achieve and maintain adequate cardiopulmonary perfusion will improve Outcome: Progressing

## 2023-11-08 NOTE — Progress Notes (Signed)
 PROGRESS NOTE    Rachel [name]  FMW:[id number] DOB: February 02, 1946 DOA: 11/04/2023 PCP: Corlis Honor [name], MD    Brief Narrative:   78 y.o. female with medical history significant for COPD and chronic respiratory failure on home O2 at 3 to 4 L, HTN, morbid obesity, history of cochlear implants being admitted with multiple acute medical issues including worsening respiratory failure secondary to COPD and CHF, pneumonia and new onset A-fib with RVR.  She is currently on BiPAP.  She presented by EMS with CPAP in place after they received a call for acute respiratory distress and a fever.  With EMS she received IV Tylenol , Solu-Medrol , DuoNebs and an IV diltiazem  push.    Assessment & Plan:   Principal Problem:   Acute respiratory failure with hypoxia (HCC) Active Problems:   Acute on chronic respiratory failure with hypoxemia (HCC)   CAP (community acquired pneumonia)   Sepsis (HCC)   COPD exacerbation (HCC)   Atrial fibrillation with rapid ventricular response, new onset (HCC)   Acute on chronic diastolic CHF (congestive heart failure) (HCC)   Hyponatremia   Essential hypertension   Obesity, Class III, BMI 40-49.9 (morbid obesity)   Acute on chronic congestive heart failure (HCC)   Hypoxia  Acute on chronic respiratory failure with hypoxemia (HCC) Secondary to CAP/sepsis, and CHF likely triggered by rapid A-fib PE among the differentials but lower suspicion.  Renal function precludes CTA. Required BiPAP at night.   Weaned to 5 L as of 6/28 Plan:  Continue high flow oxygen.  Wean as tolerated.  BiPAP as needed for naps and at night.  Diuresis on hold for now.  Okay for progressive status.  Continue to wean oxygen as tolerated.  Goal saturation 88-92%   CAP (community acquired pneumonia) Sepsis COPD exacerbation Sepsis criteria include fever, tachycardia and tachypnea with leukocytosis and lactic acidosis, airspace disease on chest x-ray Received sepsis fluid bolus in the  ED Plan: Continue antibiotics.   Hold IV fluids.  Continue bronchodilators.   IV Solu-Medrol  40 mg daily for now.   Wean oxygen as tolerated.   Atrial fibrillation with rapid ventricular response, new onset (HCC) Heart rate improved.  Remains on diltiazem  infusion. Plan: On p.o. diltiazem  240 mg CD daily.  Consider up titration of dose to 360 mg to achieve goal heart rate.  Continue IV heparin  for now.  Anticipate transition to DOAC within the next 24 to 48 hours.  Acute on chronic diastolic CHF (congestive heart failure) (HCC) BNP mildly elevated.  Chest x-ray with edema.  Received a dose of IV Lasix  in ED. Plan: Lasix  on hold.  Cardiology following.  Patient appears euvolemic   Hyponatremia Suspect hypervolemic hyponatremia in the setting of decompensated heart failure.  Diuresis on hold.  Volume status stable   Obesity, Class III, BMI 40-49.9 (morbid obesity) Complicating factor in overall care and prognosis   Essential hypertension Cardizem  CD as above   DVT prophylaxis: IV heparin  Code Status: DNR Family Communication: Sister via phone 6/25, 6/26, 6/28 Disposition Plan: Status is: Inpatient Remains inpatient appropriate because: Multiple acute issues as above   Level of care: Progressive  Consultants:  Cardiology-[facility]  Procedures:  None  Antimicrobials: Rocephin  Azithromycin    Subjective: Seen and examined.  Some right upper quadrant tenderness this morning.  Remains on high flow rate oxygen.  5 L at time of this note.  Objective: Vitals:   11/08/23 0534 11/08/23 0723 11/08/23 0828 11/08/23 1120  BP: 138/81  (!) 146/64 (!) 116/92  Pulse: 76  79 76  Resp:   20 20  Temp:   98 F (36.7 C) 98.1 F (36.7 C)  TempSrc:      SpO2: 93% 94% 92% 92%  Weight:      Height:        Intake/Output Summary (Last 24 hours) at 11/08/2023 1151 Last data filed at 11/08/2023 0900 Gross per 24 hour  Intake 769.65 ml  Output 1450 ml  Net -680.35 ml   Filed Weights    11/06/23 0500 11/07/23 0140 11/08/23 0307  Weight: 125.6 kg 125 kg 124.4 kg    Examination:  General exam: NAD.  Appears fatigued Respiratory system: Basilar crackles.  Mild wheeze.  Normal work of breathing.  5 L Cardiovascular system: 1 S2, regular rate, irregular rhythm, no murmurs, trace pedal edema Gastrointestinal system: Soft, NT/ND, normal bowel sounds Central nervous system: Alert and oriented. No focal neurological deficits. Extremities: Symmetric 5 x 5 power. Skin: No rashes, lesions or ulcers Psychiatry: Judgement and insight appear normal. Mood & affect appropriate.     Data Reviewed: I have personally reviewed following labs and imaging studies  CBC: Recent Labs  Lab 11/04/23 2114 11/05/23 0326 11/06/23 0423 11/07/23 0523 11/08/23 0354  WBC 17.1* 14.7* 13.3* 13.4* 12.2*  NEUTROABS 14.1*  --   --   --   --   HGB 12.0 10.9* 10.4* 10.1* 9.8*  HCT 36.5 33.9* 32.1* 31.0* 30.4*  MCV 94.6 94.4 93.9 93.7 93.8  PLT 217 174 194 209 222   Basic Metabolic Panel: Recent Labs  Lab 11/04/23 2114 11/05/23 0326 11/06/23 0423 11/07/23 0523 11/08/23 0354  NA 129* 133* 135 136 133*  K 3.8 3.8 3.4* 3.4* 5.0  CL 95* 101 100 102 102  CO2 21* 23 24 25 22   GLUCOSE 160* 201* 137* 114* 148*  BUN 58* 61* 73* 76* 77*  CREATININE 1.62* 1.65* 1.66* 1.81* 1.78*  CALCIUM  9.1 8.6* 8.9 9.0 9.1  MG  --   --  2.3 2.3 2.4   GFR: Estimated Creatinine Clearance: 35.1 mL/min (A) (by C-G formula based on SCr of 1.78 mg/dL (H)). Liver Function Tests: Recent Labs  Lab 11/04/23 2114  AST 23  ALT 14  ALKPHOS 67  BILITOT 1.1  PROT 7.8  ALBUMIN 3.3*   No results for input(s): LIPASE, AMYLASE in the last 168 hours. No results for input(s): AMMONIA in the last 168 hours. Coagulation Profile: Recent Labs  Lab 11/04/23 2114  INR 1.3*   Cardiac Enzymes: No results for input(s): CKTOTAL, CKMB, CKMBINDEX, TROPONINI in the last 168 hours. BNP (last 3 results) No  results for input(s): PROBNP in the last 8760 hours. HbA1C: No results for input(s): HGBA1C in the last 72 hours. CBG: Recent Labs  Lab 11/05/23 0027  GLUCAP 189*   Lipid Profile: No results for input(s): CHOL, HDL, LDLCALC, TRIG, CHOLHDL, LDLDIRECT in the last 72 hours. Thyroid Function Tests: Recent Labs    11/07/23 0623  TSH 0.848   Anemia Panel: No results for input(s): VITAMINB12, FOLATE, FERRITIN, TIBC, IRON, RETICCTPCT in the last 72 hours. Sepsis Labs: Recent Labs  Lab 11/04/23 2114 11/04/23 2300 11/05/23 0146 11/05/23 0326  PROCALCITON  --  3.76  --   --   LATICACIDVEN 2.6* 2.7* 2.7* 2.0*    Recent Results (from the past 240 hours)  Resp panel by RT-PCR (RSV, Flu A&B, Covid) Anterior Nasal Swab     Status: None   Collection Time: 11/04/23  9:14 PM  Specimen: Anterior Nasal Swab  Result Value Ref Range Status   SARS Coronavirus 2 by RT PCR NEGATIVE NEGATIVE Final    Comment: (NOTE) SARS-CoV-2 target nucleic acids are NOT DETECTED.  The SARS-CoV-2 RNA is generally detectable in upper respiratory specimens during the acute phase of infection. The lowest concentration of SARS-CoV-2 viral copies this assay can detect is 138 copies/mL. A negative result does not preclude SARS-Cov-2 infection and should not be used as the sole basis for treatment or other patient management decisions. A negative result may occur with  improper specimen collection/handling, submission of specimen other than nasopharyngeal swab, presence of viral mutation(s) within the areas targeted by this assay, and inadequate number of viral copies(<138 copies/mL). A negative result must be combined with clinical observations, patient history, and epidemiological information. The expected result is Negative.  Fact Sheet for Patients:  BloggerCourse.com  Fact Sheet for Healthcare Providers:   SeriousBroker.it  This test is no t yet approved or cleared by the [place]  FDA and  has been authorized for detection and/or diagnosis of SARS-CoV-2 by FDA under an Emergency Use Authorization (EUA). This EUA will remain  in effect (meaning this test can be used) for the duration of the COVID-19 declaration under Section 564(b)(1) of the Act, 21 U.S.C.section 360bbb-3(b)(1), unless the authorization is terminated  or revoked sooner.       Influenza A by PCR NEGATIVE NEGATIVE Final   Influenza B by PCR NEGATIVE NEGATIVE Final    Comment: (NOTE) The Xpert Xpress SARS-CoV-2/FLU/RSV plus assay is intended as an aid in the diagnosis of influenza from Nasopharyngeal swab specimens and should not be used as a sole basis for treatment. Nasal washings and aspirates are unacceptable for Xpert Xpress SARS-CoV-2/FLU/RSV testing.  Fact Sheet for Patients: BloggerCourse.com  Fact Sheet for Healthcare Providers: SeriousBroker.it  This test is not yet approved or cleared by the [place]  FDA and has been authorized for detection and/or diagnosis of SARS-CoV-2 by FDA under an Emergency Use Authorization (EUA). This EUA will remain in effect (meaning this test can be used) for the duration of the COVID-19 declaration under Section 564(b)(1) of the Act, 21 U.S.C. section 360bbb-3(b)(1), unless the authorization is terminated or revoked.     Resp Syncytial Virus by PCR NEGATIVE NEGATIVE Final    Comment: (NOTE) Fact Sheet for Patients: BloggerCourse.com  Fact Sheet for Healthcare Providers: SeriousBroker.it  This test is not yet approved or cleared by the [place]  FDA and has been authorized for detection and/or diagnosis of SARS-CoV-2 by FDA under an Emergency Use Authorization (EUA). This EUA will remain in effect (meaning this test can be used) for  the duration of the COVID-19 declaration under Section 564(b)(1) of the Act, 21 U.S.C. section 360bbb-3(b)(1), unless the authorization is terminated or revoked.  Performed at [facility], [address] [place], [place] [address]   Blood Culture (routine x 2)     Status: None (Preliminary result)   Collection Time: 11/04/23  9:14 PM   Specimen: BLOOD  Result Value Ref Range Status   Specimen Description BLOOD BLOOD RIGHT ARM  Final   Special Requests   Final    BOTTLES DRAWN AEROBIC AND ANAEROBIC Blood Culture adequate volume   Culture   Final    NO GROWTH 4 DAYS Performed at [facility], [address] [place], [place] [address]    Report Status PENDING  Incomplete  Blood Culture (routine x 2)     Status: None (Preliminary result)  Collection Time: 11/04/23  9:15 PM   Specimen: BLOOD  Result Value Ref Range Status   Specimen Description BLOOD BLOOD LEFT ARM  Final   Special Requests   Final    BOTTLES DRAWN AEROBIC AND ANAEROBIC Blood Culture results may not be optimal due to an inadequate volume of blood received in culture bottles   Culture   Final    NO GROWTH 4 DAYS Performed at [facility], [address] [place], [place] [address]    Report Status PENDING  Incomplete  MRSA Next Gen by PCR, Nasal     Status: None   Collection Time: 11/05/23  1:04 AM   Specimen: Nasal Mucosa; Nasal Swab  Result Value Ref Range Status   MRSA by PCR Next Gen NOT DETECTED NOT DETECTED Final    Comment: (NOTE) The GeneXpert MRSA Assay (FDA approved for NASAL specimens only), is one component of a comprehensive MRSA colonization surveillance program. It is not intended to diagnose MRSA infection nor to guide or monitor treatment for MRSA infections. Test performance is not FDA approved in patients less than 76 years old. Performed at [facility], [address], [place], [place] [address]          Radiology Studies: [facility] Chest [place] 1  View Result Date: 11/07/2023 CLINICAL DATA:  [id number] Hypoxia [id number]. EXAM: PORTABLE CHEST 1 VIEW COMPARISON:  11/04/2023. FINDINGS: Redemonstration of coarse interstitial pattern with hyperlucency of the lung fields, asymmetrically involving the right mid lung zone, concerning for underlying COPD changes. There are stable heterogeneous nonspecific opacities overlying bilateral mid lower lung zones without significant interval change. These may represent combination of atelectasis/scarring and pneumonitis. Correlate clinically. No pneumothorax on either side. There is subtle blunting of bilateral lateral costophrenic angles which may represent bilateral trace pleural effusions. Stable cardio-mediastinal silhouette. No acute osseous abnormalities. The soft tissues are within normal limits. IMPRESSION: No significant interval change since the prior study. Electronically Signed   By: Ree Molt M.D.   On: 11/07/2023 08:32        Scheduled Meds:  acidophilus  1 capsule Oral BID   Chlorhexidine  Gluconate Cloth  6 each Topical Q0600   diltiazem   240 mg Oral Daily   feeding supplement  237 mL Oral BID BM   FLUoxetine   10 mg Oral Daily   gabapentin   100 mg Oral BID   ipratropium-albuterol   3 mL Nebulization Q6H   latanoprost   1 drop Both Eyes QHS   melatonin  5 mg Oral QHS   memantine   10 mg Oral BID   methylPREDNISolone  (SOLU-MEDROL ) injection  40 mg Intravenous Daily   multivitamin with minerals  1 tablet Oral Daily   mouth rinse  15 mL Mouth Rinse 4 times per day   pantoprazole   40 mg Oral Daily   umeclidinium-vilanterol  1 puff Inhalation Daily   zolpidem   5 mg Oral QHS   Continuous Infusions:  azithromycin  Stopped (11/07/23 1418)   cefTRIAXone  (ROCEPHIN )  IV 2 g (11/07/23 2205)   heparin  1,500 Units/hr (11/08/23 0926)     LOS: 4 days     Calvin KATHEE Robson, MD [facility]   If 7PM-7AM, please contact night-coverage  11/08/2023, 11:51 AM

## 2023-11-08 NOTE — Progress Notes (Signed)
 PHARMACY CONSULT NOTE - FOLLOW UP  Pharmacy Consult for Electrolyte Monitoring and Replacement   Recent Labs: Potassium (mmol/L)  Date Value  11/08/2023 5.0  05/19/2011 3.4 (L)   Magnesium  (mg/dL)  Date Value  93/71/7974 2.4  05/20/2011 1.5 (L)   Calcium  (mg/dL)  Date Value  93/71/7974 9.1   Calcium , Total (mg/dL)  Date Value  98/93/7986 9.1   Albumin (g/dL)  Date Value  93/75/7974 3.3 (L)  05/17/2011 3.7   Phosphorus (mg/dL)  Date Value  98/92/7986 3.7   Sodium (mmol/L)  Date Value  11/08/2023 133 (L)  05/19/2011 143    Assessment: 78 y.o. female with medical history significant for COPD and chronic respiratory failure on home O2 at 3 to 4 L, HTN, morbid obesity, history of cochlear implants being admitted with multiple acute medical issues including worsening respiratory failure secondary to COPD and CHF, pneumonia and new onset A-fib with RVR. Pharmacy is asked to follow and replace electrolytes while in CCU  Diuretics: furosemide  40 mg IV BID - stopped.   Goal of Therapy:  Potassium 4.0 - 5.1 mmol/L Magnesium  2.0 - 2.4 mg/dL All Other Electrolytes WNL  Plan:  No replacement needed. Pt transferred out of the ICU. Pharmacy will sign off. Please re-consult if needed.   Cathaleen GORMAN Blanch ,PharmD Clinical Pharmacist 11/08/2023 8:30 AM

## 2023-11-08 NOTE — Plan of Care (Signed)

## 2023-11-08 NOTE — Progress Notes (Signed)
 Progress Note  Patient Name: Rachel [name] Date of Encounter: 11/08/2023  Primary Cardiologist: [facility]  Subjective   Transferred out of the ICU on 6/27. Supplemental oxygen requirement has been weaned from BiPAP down to HFNC 5 L/min. CXR with possible pneumonitis, started on steroids. Documented UOP 1.2 L for the past 24 hours. Lasix  held on 6/27 with up trending renal function. Remains in Afib with ventricular rates in the 80s to 90s bpm. No chest pain or palpitations. Dyspnea and wheezing unchanged.    Inpatient Medications    Scheduled Meds:  acidophilus  1 capsule Oral BID   Chlorhexidine  Gluconate Cloth  6 each Topical Q0600   diltiazem   60 mg Oral Q6H   feeding supplement  237 mL Oral BID BM   FLUoxetine   10 mg Oral Daily   gabapentin   100 mg Oral BID   ipratropium-albuterol   3 mL Nebulization Q6H   latanoprost   1 drop Both Eyes QHS   melatonin  5 mg Oral QHS   memantine   10 mg Oral BID   methylPREDNISolone  (SOLU-MEDROL ) injection  40 mg Intravenous Daily   multivitamin with minerals  1 tablet Oral Daily   mouth rinse  15 mL Mouth Rinse 4 times per day   pantoprazole   40 mg Oral Daily   umeclidinium-vilanterol  1 puff Inhalation Daily   zolpidem   5 mg Oral QHS   Continuous Infusions:  azithromycin  Stopped (11/07/23 1418)   cefTRIAXone  (ROCEPHIN )  IV 2 g (11/07/23 2205)   heparin  1,550 Units/hr (11/07/23 2315)   PRN Meds: acetaminophen  **OR** acetaminophen , albuterol , alum & mag hydroxide-simeth, ondansetron  **OR** ondansetron  (ZOFRAN ) IV, mouth rinse   Vital Signs    Vitals:   11/08/23 0230 11/08/23 0307 11/08/23 0534 11/08/23 0723  BP: (!) 142/88  138/81   Pulse: 71  76   Resp: (!) 22     Temp: (!) 97.4 F (36.3 C)     TempSrc:      SpO2: 97%  93% 94%  Weight:  124.4 kg    Height:        Intake/Output Summary (Last 24 hours) at 11/08/2023 0732 Last data filed at 11/08/2023 0540 Gross per 24 hour  Intake 705.57 ml  Output 2000 ml  Net  -1294.43 ml   Filed Weights   11/06/23 0500 11/07/23 0140 11/08/23 0307  Weight: 125.6 kg 125 kg 124.4 kg    Telemetry    Atrial fibrillation rate 80s to 90s bpm - Personally Reviewed  ECG    No new tracings - Personally Reviewed  Physical Exam   GEN: Obesity, alert, no acute distress.   Neck: Unable to estimate JVD. Cardiac: Irregularly irregular, no murmurs, rubs, or gallops.  Respiratory: Scattered wheezing bilaterally  GI: Soft, nontender, non-distended.   MS: No edema; No deformity. Neuro:  Alert and oriented x 3; Nonfocal.  Psych: Normal affect.  Labs    Chemistry Recent Labs  Lab 11/04/23 2114 11/05/23 0326 11/06/23 0423 11/07/23 0523 11/08/23 0354  NA 129*   < > 135 136 133*  K 3.8   < > 3.4* 3.4* 5.0  CL 95*   < > 100 102 102  CO2 21*   < > 24 25 22   GLUCOSE 160*   < > 137* 114* 148*  BUN 58*   < > 73* 76* 77*  CREATININE 1.62*   < > 1.66* 1.81* 1.78*  CALCIUM  9.1   < > 8.9 9.0 9.1  PROT 7.8  --   --   --   --  ALBUMIN 3.3*  --   --   --   --   AST 23  --   --   --   --   ALT 14  --   --   --   --   ALKPHOS 67  --   --   --   --   BILITOT 1.1  --   --   --   --   GFRNONAA 32*   < > 31* 28* 29*  ANIONGAP 13   < > 11 9 9    < > = values in this interval not displayed.     Hematology Recent Labs  Lab 11/06/23 0423 11/07/23 0523 11/08/23 0354  WBC 13.3* 13.4* 12.2*  RBC 3.42* 3.31* 3.24*  HGB 10.4* 10.1* 9.8*  HCT 32.1* 31.0* 30.4*  MCV 93.9 93.7 93.8  MCH 30.4 30.5 30.2  MCHC 32.4 32.6 32.2  RDW 13.6 14.0 14.1  PLT 194 209 222    Cardiac EnzymesNo results for input(s): TROPONINI in the last 168 hours. No results for input(s): TROPIPOC in the last 168 hours.   BNP Recent Labs  Lab 11/04/23 2114  BNP 347.2*     DDimer No results for input(s): DDIMER in the last 168 hours.   Radiology    DG Chest Port 1 View Result Date: 11/04/2023 IMPRESSION: Bilateral lower lobe airspace opacities could reflect edema or infection.  Electronically Signed   By: Franky Crease M.D.   On: 11/04/2023 21:36    Cardiac Studies   2D echo 11/05/2023: 1. Left ventricular ejection fraction, by estimation, is 60 to 65%. The  left ventricle has normal function. The left ventricle has no regional  wall motion abnormalities. Left ventricular diastolic parameters are  indeterminate.   2. Right ventricular systolic function is normal. The right ventricular  size is normal. There is normal pulmonary artery systolic pressure. The  estimated right ventricular systolic pressure is 27.8 mmHg.   3. The mitral valve is normal in structure. No evidence of mitral valve  regurgitation. No evidence of mitral stenosis.   4. The aortic valve is normal in structure. Aortic valve regurgitation is  not visualized. No aortic stenosis is present.   5. The inferior vena cava is normal in size with greater than 50%  respiratory variability, suggesting right atrial pressure of 3 mmHg.   Patient Profile     [name] is a 78 year old woman with history of COPD, chronic respiratory failure on 3 to 4 L nasal cannula, hypertension, morbid obesity presenting from nursing facility November 04, 2023 with respiratory distress, tachycardia, atrial fibrillation with RVR, on BiPAP  Assessment & Plan    Atrial fibrillation with RVR -Timing of onset unclear, prior EKG July 2023 normal sinus rhythm -Remains in Afib with controlled ventricular response  -Consolidate short-acting diltiazem  to Cardizem  CD 240 mg daily -Continue heparin  gtt with recommendation to transition to DOAC prior to discharge  -Last resort would be amiodarone infusion though would prefer not to use amiodarone as she has not been fully anticoagulated and there would be risk of conversion/stroke -Anticipate DCCV in the outpatient setting once her acute illness is improved and following uninterrupted anticoagulation for 3-4 weeks unless she has difficult to control rates, at which time we  would need to pursue TEE-guided DCCV prior to discharge  -TSH normal   Acute respiratory distress -Improving In the setting of atrial fibrillation with RVR rate 180 bpm on arrival with CAP, COPD exacerbation, and possible pneumonitis -  Has been weaned from BiPAP to HFNC at 5 L -Does not appear to be significantly volume up -Started on Solu-Medrol  on 6/27  -On Rocephin  and azithromycin   -IV fluids on hold   AKI: -Baseline unclear -Continue to hold IV Lasix    Hypokalemia: -Repleted  -Magnesium  at goal  Normocytic anemia: -Hgb low, though stable    For questions or updates, please contact [facility] Please consult [email or web address] for contact info under Cardiology/STEMI.   Signed, Bernardino Bring, PA-C [facility]

## 2023-11-09 ENCOUNTER — Encounter: Payer: Self-pay | Admitting: Internal Medicine

## 2023-11-09 DIAGNOSIS — J441 Chronic obstructive pulmonary disease with (acute) exacerbation: Secondary | ICD-10-CM | POA: Diagnosis not present

## 2023-11-09 DIAGNOSIS — J9601 Acute respiratory failure with hypoxia: Secondary | ICD-10-CM | POA: Diagnosis not present

## 2023-11-09 DIAGNOSIS — J189 Pneumonia, unspecified organism: Secondary | ICD-10-CM

## 2023-11-09 DIAGNOSIS — I4891 Unspecified atrial fibrillation: Secondary | ICD-10-CM

## 2023-11-09 DIAGNOSIS — I509 Heart failure, unspecified: Secondary | ICD-10-CM | POA: Diagnosis not present

## 2023-11-09 DIAGNOSIS — Z66 Do not resuscitate: Secondary | ICD-10-CM

## 2023-11-09 DIAGNOSIS — Z515 Encounter for palliative care: Secondary | ICD-10-CM

## 2023-11-09 LAB — CBC
HCT: 30.3 % — ABNORMAL LOW (ref 36.0–46.0)
Hemoglobin: 9.8 g/dL — ABNORMAL LOW (ref 12.0–15.0)
MCH: 30.4 pg (ref 26.0–34.0)
MCHC: 32.3 g/dL (ref 30.0–36.0)
MCV: 94.1 fL (ref 80.0–100.0)
Platelets: 238 10*3/uL (ref 150–400)
RBC: 3.22 MIL/uL — ABNORMAL LOW (ref 3.87–5.11)
RDW: 14.3 % (ref 11.5–15.5)
WBC: 15.6 10*3/uL — ABNORMAL HIGH (ref 4.0–10.5)
nRBC: 0.1 % (ref 0.0–0.2)

## 2023-11-09 LAB — BASIC METABOLIC PANEL WITH GFR
Anion gap: 9 (ref 5–15)
BUN: 76 mg/dL — ABNORMAL HIGH (ref 8–23)
CO2: 24 mmol/L (ref 22–32)
Calcium: 9.3 mg/dL (ref 8.9–10.3)
Chloride: 103 mmol/L (ref 98–111)
Creatinine, Ser: 1.67 mg/dL — ABNORMAL HIGH (ref 0.44–1.00)
GFR, Estimated: 31 mL/min — ABNORMAL LOW (ref >60)
Glucose, Bld: 150 mg/dL — ABNORMAL HIGH (ref 70–99)
Potassium: 4.8 mmol/L (ref 3.5–5.1)
Sodium: 136 mmol/L (ref 135–145)

## 2023-11-09 LAB — CULTURE, BLOOD (ROUTINE X 2)
Culture: NO GROWTH
Culture: NO GROWTH
Special Requests: ADEQUATE

## 2023-11-09 LAB — MAGNESIUM: Magnesium: 2.4 mg/dL (ref 1.7–2.4)

## 2023-11-09 LAB — HEPARIN LEVEL (UNFRACTIONATED): Heparin Unfractionated: 0.6 [IU]/mL (ref 0.30–0.70)

## 2023-11-09 MED ORDER — LIDOCAINE 5 % EX PTCH
1.0000 | MEDICATED_PATCH | CUTANEOUS | Status: DC
  Administered 2023-11-09 – 2023-11-13 (×3): 1 via TRANSDERMAL
  Filled 2023-11-09 (×5): qty 1

## 2023-11-09 MED ORDER — DILTIAZEM HCL 25 MG/5ML IV SOLN
10.0000 mg | Freq: Once | INTRAVENOUS | Status: AC
  Administered 2023-11-09: 10 mg via INTRAVENOUS

## 2023-11-09 MED ORDER — METHYLPREDNISOLONE SODIUM SUCC 125 MG IJ SOLR: 125.0000 mg | Freq: Once | INTRAMUSCULAR | Status: DC

## 2023-11-09 MED ORDER — DILTIAZEM HCL ER COATED BEADS 180 MG PO CP24
360.0000 mg | ORAL_CAPSULE | Freq: Every day | ORAL | Status: DC
  Administered 2023-11-10 – 2023-11-13 (×4): 360 mg via ORAL
  Filled 2023-11-09 (×4): qty 2

## 2023-11-09 MED ORDER — METHYLPREDNISOLONE SODIUM SUCC 40 MG IJ SOLR
40.0000 mg | Freq: Once | INTRAMUSCULAR | Status: AC
  Administered 2023-11-09: 40 mg via INTRAVENOUS

## 2023-11-09 MED ORDER — AZITHROMYCIN 250 MG PO TABS
500.0000 mg | ORAL_TABLET | Freq: Every day | ORAL | Status: AC
  Administered 2023-11-09: 500 mg via ORAL
  Filled 2023-11-09: qty 2

## 2023-11-09 NOTE — Progress Notes (Signed)
 PROGRESS NOTE    Rachel [name]  FMW:[id number] DOB: 1945-11-04 DOA: 11/04/2023 PCP: Corlis Honor [name], MD    Brief Narrative:   78 y.o. female with medical history significant for COPD and chronic respiratory failure on home O2 at 3 to 4 L, HTN, morbid obesity, history of cochlear implants being admitted with multiple acute medical issues including worsening respiratory failure secondary to COPD and CHF, pneumonia and new onset A-fib with RVR.  She is currently on BiPAP.  She presented by EMS with CPAP in place after they received a call for acute respiratory distress and a fever.  With EMS she received IV Tylenol , Solu-Medrol , DuoNebs and an IV diltiazem  push.    Assessment & Plan:   Principal Problem:   Acute respiratory failure with hypoxia (HCC) Active Problems:   Acute on chronic respiratory failure with hypoxemia (HCC)   CAP (community acquired pneumonia)   Sepsis (HCC)   COPD exacerbation (HCC)   Atrial fibrillation with rapid ventricular response, new onset (HCC)   Acute on chronic diastolic CHF (congestive heart failure) (HCC)   Hyponatremia   Essential hypertension   Obesity, Class III, BMI 40-49.9 (morbid obesity)   Acute on chronic congestive heart failure (HCC)   Hypoxia   Right upper quadrant abdominal pain  Acute on chronic respiratory failure with hypoxemia (HCC) Secondary to CAP/sepsis, and CHF likely triggered by rapid A-fib PE among the differentials but lower suspicion.  Renal function precludes CTA. Required BiPAP at night.   Weaned to 5 L as of 6/28 Plan:  Continue high flow oxygen.  Wean as tolerated.  BiPAP as needed for naps and at night.  Diuresis on hold for now.  Okay for progressive status.  Continue to wean oxygen as tolerated.  Goal saturation 88-92%  Aspiration event Rapid response called on 6/20 9 AM after patient choked on eggs IM and was found to be coughing with rapid atrial fibrillation heart rate up to 170s on monitor.  Responded to  bedside.  Patient was sitting up in bed.  Coughing substantially.  Able to speak and answer questions.  No signs of airway compromise. Plan: Seen by SLP.  No change in dietary consistency at this time.  Isolated aspiration event.  Ensure patient is sitting as upright as possible and remains upright for at least 30 minutes after eating.   CAP (community acquired pneumonia) Sepsis COPD exacerbation Sepsis criteria include fever, tachycardia and tachypnea with leukocytosis and lactic acidosis, airspace disease on chest x-ray Received sepsis fluid bolus in the ED Plan: Last dose of antibiotic today.  Continue holding IV fluids.  Continue bronchodilators.  Solu-Medrol  40 mg IV daily for now.  Wean oxygen as tolerated.  Atrial fibrillation with rapid ventricular response, new onset (HCC) Heart rate improved.  Remains on diltiazem  infusion. Plan: P.o. diltiazem  360 mg daily.  Continue heparin  GTT for now.  Transition to DOAC at time of discharge.  Acute on chronic diastolic CHF (congestive heart failure) (HCC) BNP mildly elevated.  Chest x-ray with edema.  Received a dose of IV Lasix  in ED. Plan: Lasix  on hold.  Cardiology following.  Patient appears euvolemic   Hyponatremia Suspect hypervolemic hyponatremia in the setting of decompensated heart failure.  Diuresis on hold.  Volume status stable   Obesity, Class III, BMI 40-49.9 (morbid obesity) Complicating factor in overall care and prognosis   Essential hypertension Cardizem  CD as above   DVT prophylaxis: IV heparin  Code Status: DNR Family Communication: Sister via phone 6/25, 6/26, 6/28,  6/29 Disposition Plan: Status is: Inpatient Remains inpatient appropriate because: Multiple acute issues as above   Level of care: Progressive  Consultants:  [facility]  Procedures:  None  Antimicrobials:   Subjective: Seen and examined.  Continues to endorse right upper quadrant tenderness.  Unclear etiology.  Workup negative.   RRT called for aspiration event this morning.  Objective: Vitals:   11/09/23 1010 11/09/23 1114 11/09/23 1300 11/09/23 1359  BP:  (!) 150/87    Pulse:  (!) 118    Resp:  18    Temp:  98.6 F (37 C)    TempSrc:      SpO2: 92% 92% 94% 94%  Weight:      Height:        Intake/Output Summary (Last 24 hours) at 11/09/2023 1405 Last data filed at 11/09/2023 0400 Gross per 24 hour  Intake 300 ml  Output 1000 ml  Net -700 ml   Filed Weights   11/07/23 0140 11/08/23 0307 11/09/23 0500  Weight: 125 kg 124.4 kg 125.1 kg    Examination:  General exam: No acute distress.  Appears fatigued and chronically ill Respiratory system: Breath sounds bilaterally.  Mild wheeze.  Normal work of breathing.  8 L Cardiovascular system: 1 S2, regular rate, irregular rhythm, no murmurs, trace pedal edema Gastrointestinal system: Soft, NT/ND, normal bowel sounds Central nervous system: Alert and oriented. No focal neurological deficits. Extremities: Symmetric 5 x 5 power. Skin: No rashes, lesions or ulcers Psychiatry: Judgement and insight appear normal. Mood & affect appropriate.     Data Reviewed: I have personally reviewed following labs and imaging studies  CBC: Recent Labs  Lab 11/04/23 2114 11/05/23 0326 11/06/23 0423 11/07/23 0523 11/08/23 0354 11/09/23 0424  WBC 17.1* 14.7* 13.3* 13.4* 12.2* 15.6*  NEUTROABS 14.1*  --   --   --   --   --   HGB 12.0 10.9* 10.4* 10.1* 9.8* 9.8*  HCT 36.5 33.9* 32.1* 31.0* 30.4* 30.3*  MCV 94.6 94.4 93.9 93.7 93.8 94.1  PLT 217 174 194 209 222 238   Basic Metabolic Panel: Recent Labs  Lab 11/05/23 0326 11/06/23 0423 11/07/23 0523 11/08/23 0354 11/09/23 0419  NA 133* 135 136 133* 136  K 3.8 3.4* 3.4* 5.0 4.8  CL 101 100 102 102 103  CO2 23 24 25 22 24   GLUCOSE 201* 137* 114* 148* 150*  BUN 61* 73* 76* 77* 76*  CREATININE 1.65* 1.66* 1.81* 1.78* 1.67*  CALCIUM  8.6* 8.9 9.0 9.1 9.3  MG  --  2.3 2.3 2.4 2.4   GFR: Estimated Creatinine  Clearance: 37.5 mL/min (A) (by C-G formula based on SCr of 1.67 mg/dL (H)). Liver Function Tests: Recent Labs  Lab 11/04/23 2114  AST 23  ALT 14  ALKPHOS 67  BILITOT 1.1  PROT 7.8  ALBUMIN 3.3*   No results for input(s): LIPASE, AMYLASE in the last 168 hours. No results for input(s): AMMONIA in the last 168 hours. Coagulation Profile: Recent Labs  Lab 11/04/23 2114  INR 1.3*   Cardiac Enzymes: No results for input(s): CKTOTAL, CKMB, CKMBINDEX, TROPONINI in the last 168 hours. BNP (last 3 results) No results for input(s): PROBNP in the last 8760 hours. HbA1C: No results for input(s): HGBA1C in the last 72 hours. CBG: Recent Labs  Lab 11/05/23 0027  GLUCAP 189*   Lipid Profile: No results for input(s): CHOL, HDL, LDLCALC, TRIG, CHOLHDL, LDLDIRECT in the last 72 hours. Thyroid Function Tests: Recent Labs    11/07/23 [phone]  TSH 0.848   Anemia Panel: No results for input(s): VITAMINB12, FOLATE, FERRITIN, TIBC, IRON, RETICCTPCT in the last 72 hours. Sepsis Labs: Recent Labs  Lab 11/04/23 2114 11/04/23 2300 11/05/23 0146 11/05/23 0326  PROCALCITON  --  3.76  --   --   LATICACIDVEN 2.6* 2.7* 2.7* 2.0*    Recent Results (from the past 240 hours)  Resp panel by RT-PCR (RSV, Flu A&B, Covid) Anterior Nasal Swab     Status: None   Collection Time: 11/04/23  9:14 PM   Specimen: Anterior Nasal Swab  Result Value Ref Range Status   SARS Coronavirus 2 by RT PCR NEGATIVE NEGATIVE Final    Comment: (NOTE) SARS-CoV-2 target nucleic acids are NOT DETECTED.  The SARS-CoV-2 RNA is generally detectable in upper respiratory specimens during the acute phase of infection. The lowest concentration of SARS-CoV-2 viral copies this assay can detect is 138 copies/mL. A negative result does not preclude SARS-Cov-2 infection and should not be used as the sole basis for treatment or other patient management decisions. A negative result may  occur with  improper specimen collection/handling, submission of specimen other than nasopharyngeal swab, presence of viral mutation(s) within the areas targeted by this assay, and inadequate number of viral copies(<138 copies/mL). A negative result must be combined with clinical observations, patient history, and epidemiological information. The expected result is Negative.  Fact Sheet for Patients:  BloggerCourse.com  Fact Sheet for Healthcare Providers:  SeriousBroker.it  This test is no t yet approved or cleared by the [place]  FDA and  has been authorized for detection and/or diagnosis of SARS-CoV-2 by FDA under an Emergency Use Authorization (EUA). This EUA will remain  in effect (meaning this test can be used) for the duration of the COVID-19 declaration under Section 564(b)(1) of the Act, 21 U.S.C.section 360bbb-3(b)(1), unless the authorization is terminated  or revoked sooner.       Influenza A by PCR NEGATIVE NEGATIVE Final   Influenza B by PCR NEGATIVE NEGATIVE Final    Comment: (NOTE) The Xpert Xpress SARS-CoV-2/FLU/RSV plus assay is intended as an aid in the diagnosis of influenza from Nasopharyngeal swab specimens and should not be used as a sole basis for treatment. Nasal washings and aspirates are unacceptable for Xpert Xpress SARS-CoV-2/FLU/RSV testing.  Fact Sheet for Patients: BloggerCourse.com  Fact Sheet for Healthcare Providers: SeriousBroker.it  This test is not yet approved or cleared by the [place]  FDA and has been authorized for detection and/or diagnosis of SARS-CoV-2 by FDA under an Emergency Use Authorization (EUA). This EUA will remain in effect (meaning this test can be used) for the duration of the COVID-19 declaration under Section 564(b)(1) of the Act, 21 U.S.C. section 360bbb-3(b)(1), unless the authorization is terminated  or revoked.     Resp Syncytial Virus by PCR NEGATIVE NEGATIVE Final    Comment: (NOTE) Fact Sheet for Patients: BloggerCourse.com  Fact Sheet for Healthcare Providers: SeriousBroker.it  This test is not yet approved or cleared by the [place]  FDA and has been authorized for detection and/or diagnosis of SARS-CoV-2 by FDA under an Emergency Use Authorization (EUA). This EUA will remain in effect (meaning this test can be used) for the duration of the COVID-19 declaration under Section 564(b)(1) of the Act, 21 U.S.C. section 360bbb-3(b)(1), unless the authorization is terminated or revoked.  Performed at [facility], [address], [place], [place] [address]   Blood Culture (routine x 2)     Status: None   Collection Time: 11/04/23  9:14 PM   Specimen: BLOOD  Result Value Ref Range Status   Specimen Description BLOOD BLOOD RIGHT ARM  Final   Special Requests   Final    BOTTLES DRAWN AEROBIC AND ANAEROBIC Blood Culture adequate volume   Culture   Final    NO GROWTH 5 DAYS Performed at [facility], [address] [place], [place] [address]    Report Status 11/09/2023 FINAL  Final  Blood Culture (routine x 2)     Status: None   Collection Time: 11/04/23  9:15 PM   Specimen: BLOOD  Result Value Ref Range Status   Specimen Description BLOOD BLOOD LEFT ARM  Final   Special Requests   Final    BOTTLES DRAWN AEROBIC AND ANAEROBIC Blood Culture results may not be optimal due to an inadequate volume of blood received in culture bottles   Culture   Final    NO GROWTH 5 DAYS Performed at [facility], [address] [place], [place] [address]    Report Status 11/09/2023 FINAL  Final  MRSA Next Gen by PCR, Nasal     Status: None   Collection Time: 11/05/23  1:04 AM   Specimen: Nasal Mucosa; Nasal Swab  Result Value Ref Range Status   MRSA by PCR Next Gen NOT DETECTED NOT DETECTED Final     Comment: (NOTE) The GeneXpert MRSA Assay (FDA approved for NASAL specimens only), is one component of a comprehensive MRSA colonization surveillance program. It is not intended to diagnose MRSA infection nor to guide or monitor treatment for MRSA infections. Test performance is not FDA approved in patients less than 85 years old. Performed at [facility], [address] [place], [place] [address]          Radiology Studies: US  Abdomen Limited RUQ (LIVER/GB) Result Date: 11/08/2023 CLINICAL DATA:  Right upper quadrant pain EXAM: ULTRASOUND ABDOMEN LIMITED RIGHT UPPER QUADRANT COMPARISON:  None Available. FINDINGS: Gallbladder: No gallstones or wall thickening visualized. No sonographic Murphy sign noted by sonographer. Common bile duct: Diameter: 3 mm. Liver: No focal lesion identified. Increased parenchymal echogenicity. Portal vein is patent on color Doppler imaging with normal direction of blood flow towards the liver. Other: None. IMPRESSION: 1. Hepatic steatosis. 2. No cholelithiasis or evidence of acute cholecystitis. Electronically Signed   By: Norman Gatlin M.D.   On: 11/08/2023 19:21        Scheduled Meds:  acidophilus  1 capsule Oral BID   Chlorhexidine  Gluconate Cloth  6 each Topical Q0600   [START ON 11/10/2023] diltiazem   360 mg Oral Daily   feeding supplement  237 mL Oral BID BM   FLUoxetine   10 mg Oral Daily   gabapentin   100 mg Oral BID   ipratropium-albuterol   3 mL Nebulization Q6H   latanoprost   1 drop Both Eyes QHS   lidocaine  1 patch Transdermal Q24H   melatonin  5 mg Oral QHS   memantine   10 mg Oral BID   methylPREDNISolone  (SOLU-MEDROL ) injection  125 mg Intravenous Once   multivitamin with minerals  1 tablet Oral Daily   mouth rinse  15 mL Mouth Rinse 4 times per day   pantoprazole   40 mg Oral Daily   umeclidinium-vilanterol  1 puff Inhalation Daily   zolpidem   5 mg Oral QHS   Continuous Infusions:  cefTRIAXone  (ROCEPHIN )  IV 2 g  (11/08/23 2056)   heparin  1,500 Units/hr (11/09/23 1106)     LOS: 5 days  Calvin KATHEE Robson, MD [facility]   If 7PM-7AM, please contact night-coverage  11/09/2023, 2:05 PM

## 2023-11-09 NOTE — Significant Event (Signed)
 Rapid Response Event Note   Reason for Call : called RRT, pt choked on eggs, coughing, HR up to 160's on monitor.   Initial Focused Assessment: Sitting up in bed, coughing, able to speak and answer questions. Afib on monitor, between 120'-160's.      Interventions: Dr Jhonny to bedside, Cardizem  and Solumedrol IV ordered.   Plan of Care: as above, will continue to follow. Harlene, RN to call for further assistance.    Event Summary:   MD Notified: Jhonny [phone] Call [phone] Arrival Upfz:9065 End Upfz:9054  Runette Scifres A, RN

## 2023-11-09 NOTE — Progress Notes (Signed)
 Palliative Care Progress Note, Assessment & Plan   Patient Name: Rachel [name]       Date: 11/09/2023 DOB: [birth year]  Age: 78 y.o. MRN#: [id number] Attending Physician: Jhonny Calvin NOVAK, MD Primary Care Physician: Corlis Honor [name], MD Admit Date: 11/04/2023  Subjective: Feels ok today. C/o continuing RUQ pain with nausea. Denies emesis. Denies CP. SOB at baseline and not worse than normal. No pain or symptoms from previous choking episode earlier today. Good appetite. Last BM yesterday.   HPI: Per previous HPI: 78 y.o. female with medical history significant for COPD and chronic respiratory failure on home O2 at 3 to 4 L, HTN, morbid obesity, history of cochlear implants being admitted with multiple acute medical issues including worsening respiratory failure secondary to COPD and CHF, pneumonia and new onset A-fib with RVR.  EMS gave IV Tylenol , solumedrol, Duoneb and and IV diltiazem .   Upon arrival to ED, pt was found to be febrile 100.8, in rapid A-fib with HR 177, RR 30's, BP 127/97, SpO2 92% on CPAP and was transitioned to BiPAP on arrival.   ED workup found WBC 17.1, Lactic 2.6->2.7->2.7->2.0, Na+ 129, BUN 58, creatinine 1.62, albumin 3.3, BNP 347.2, troponin 44-->39. Flu/RSV/Covid negative.  CXR showed bilateral lower lobe airspace opacities that could reflect edema or infection.   She was admitted to [facility] for management of acute on chronic respiratory failure with hypoxemia, CAP, sepsis, COPD exacerbation, new onset A-fib with RVR, acute on chronic diastolic CHF and hyponatremia.   Palliative care was consulted for assistance with goals of care conversation.   Summary of counseling/coordination of care: Extensive chart review completed prior to meeting patient including labs, vital signs,  imaging, progress notes, orders, and available advanced directive documents from current and previous encounters.   After reviewing the patient's chart and assessing the patient at bedside, I spoke with patient in regards to symptom management and goals of care.   Ill-appearing, obese female resting in bed. She is A&O, calm and pleasant. She is able to engage in meaningful conversation. Noted to have increased WOB. She is in no distress.   Therapeutic silence and active listening provided for patient to share her thoughts and emotions regarding current medical situation.  Emotional support provided.  Physical Exam Constitutional:      General: She is not in acute distress.    Appearance: She is obese. She is ill-appearing. She is not toxic-appearing.  HENT:     Head: Normocephalic and atraumatic.     Mouth/Throat:     Mouth: Mucous membranes are moist.  Pulmonary:     Effort: No respiratory distress.     Comments: Increased WOB- O2 via [place] Abdominal:     General: There is distension.     Tenderness: There is abdominal tenderness. There is no guarding.     Comments: RUQ tenderness   Musculoskeletal:     Right lower leg: No edema.     Left lower leg: No edema.   Skin:    General: Skin is warm and dry.     Coloration: Skin is pale.   Neurological:     Mental Status: She is alert and oriented to person, place, and time.  Psychiatric:        Mood and Affect: Mood normal.        Behavior: Behavior normal.        Thought Content: Thought content normal.        Judgment: Judgment normal.   Recommendations/Plan: Continue DNR/DNI status as previously documented    Continue current supportive interventions Per TOC, to SNF at d/c    Total Time 35 minutes   Time spent includes: Detailed review of medical records (labs, imaging, vital signs), medically appropriate exam (mental status, respiratory, cardiac, skin), discussed with treatment team, counseling and educating patient,  family and staff, documenting clinical information, medication management and coordination of care.     Devere Sacks, ELNITA- [facility] Palliative Medicine Team  11/09/2023 10:53 AM  Office [phone]  Pager [phone]
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
## 2023-11-09 NOTE — Plan of Care (Signed)

## 2023-11-09 NOTE — Plan of Care (Signed)
  Problem: Education: Goal: Knowledge of General Education information will improve Description: Including pain rating scale, medication(s)/side effects and non-pharmacologic comfort measures Outcome: Progressing   Problem: Health Behavior/Discharge Planning: Goal: Ability to manage health-related needs will improve Outcome: Progressing   Problem: Clinical Measurements: Goal: Ability to maintain clinical measurements within normal limits will improve Outcome: Progressing Goal: Will remain free from infection Outcome: Progressing Goal: Diagnostic test results will improve Outcome: Progressing Goal: Respiratory complications will improve Outcome: Progressing Goal: Cardiovascular complication will be avoided Outcome: Progressing   Problem: Activity: Goal: Risk for activity intolerance will decrease Outcome: Progressing   Problem: Nutrition: Goal: Adequate nutrition will be maintained Outcome: Progressing   Problem: Coping: Goal: Level of anxiety will decrease Outcome: Progressing   Problem: Elimination: Goal: Will not experience complications related to bowel motility Outcome: Progressing Goal: Will not experience complications related to urinary retention Outcome: Progressing   Problem: Pain Managment: Goal: General experience of comfort will improve and/or be controlled Outcome: Progressing   Problem: Safety: Goal: Ability to remain free from injury will improve Outcome: Progressing   Problem: Skin Integrity: Goal: Risk for impaired skin integrity will decrease Outcome: Progressing   Problem: Education: Goal: Knowledge of disease or condition will improve Outcome: Progressing Goal: Knowledge of the prescribed therapeutic regimen will improve Outcome: Progressing Goal: Individualized Educational Video(s) Outcome: Progressing   Problem: Activity: Goal: Ability to tolerate increased activity will improve Outcome: Progressing Goal: Will verbalize the  importance of balancing activity with adequate rest periods Outcome: Progressing   Problem: Respiratory: Goal: Ability to maintain a clear airway will improve Outcome: Progressing Goal: Levels of oxygenation will improve Outcome: Progressing Goal: Ability to maintain adequate ventilation will improve Outcome: Progressing   Problem: Education: Goal: Knowledge of disease or condition will improve Outcome: Progressing Goal: Understanding of medication regimen will improve Outcome: Progressing Goal: Individualized Educational Video(s) Outcome: Progressing   Problem: Activity: Goal: Ability to tolerate increased activity will improve Outcome: Progressing   Problem: Cardiac: Goal: Ability to achieve and maintain adequate cardiopulmonary perfusion will improve Outcome: Progressing   Problem: Health Behavior/Discharge Planning: Goal: Ability to safely manage health-related needs after discharge will improve Outcome: Progressing   Problem: Fluid Volume: Goal: Hemodynamic stability will improve Outcome: Progressing   Problem: Clinical Measurements: Goal: Diagnostic test results will improve Outcome: Progressing Goal: Signs and symptoms of infection will decrease Outcome: Progressing   Problem: Respiratory: Goal: Ability to maintain adequate ventilation will improve Outcome: Progressing   Problem: Education: Goal: Ability to demonstrate management of disease process will improve Outcome: Progressing Goal: Ability to verbalize understanding of medication therapies will improve Outcome: Progressing Goal: Individualized Educational Video(s) Outcome: Progressing   Problem: Activity: Goal: Capacity to carry out activities will improve Outcome: Progressing   Problem: Cardiac: Goal: Ability to achieve and maintain adequate cardiopulmonary perfusion will improve Outcome: Progressing

## 2023-11-09 NOTE — Plan of Care (Signed)
  Problem: Education: Goal: Knowledge of General Education information will improve Description: Including pain rating scale, medication(s)/side effects and non-pharmacologic comfort measures Outcome: Progressing   Problem: Health Behavior/Discharge Planning: Goal: Ability to manage health-related needs will improve Outcome: Progressing   Problem: Clinical Measurements: Goal: Ability to maintain clinical measurements within normal limits will improve Outcome: Progressing Goal: Will remain free from infection Outcome: Progressing Goal: Diagnostic test results will improve Outcome: Progressing Goal: Respiratory complications will improve Outcome: Progressing Goal: Cardiovascular complication will be avoided Outcome: Progressing   Problem: Activity: Goal: Risk for activity intolerance will decrease Outcome: Progressing   Problem: Nutrition: Goal: Adequate nutrition will be maintained Outcome: Progressing   Problem: Coping: Goal: Level of anxiety will decrease Outcome: Progressing   Problem: Elimination: Goal: Will not experience complications related to bowel motility Outcome: Progressing Goal: Will not experience complications related to urinary retention Outcome: Progressing   Problem: Pain Managment: Goal: General experience of comfort will improve and/or be controlled Outcome: Progressing   Problem: Safety: Goal: Ability to remain free from injury will improve Outcome: Progressing   Problem: Skin Integrity: Goal: Risk for impaired skin integrity will decrease Outcome: Progressing   Problem: Education: Goal: Knowledge of disease or condition will improve Outcome: Progressing Goal: Knowledge of the prescribed therapeutic regimen will improve Outcome: Progressing Goal: Individualized Educational Video(s) Outcome: Progressing   Problem: Activity: Goal: Ability to tolerate increased activity will improve Outcome: Progressing Goal: Will verbalize the  importance of balancing activity with adequate rest periods Outcome: Progressing   Problem: Respiratory: Goal: Ability to maintain a clear airway will improve Outcome: Progressing Goal: Levels of oxygenation will improve Outcome: Progressing Goal: Ability to maintain adequate ventilation will improve Outcome: Progressing   Problem: Education: Goal: Knowledge of disease or condition will improve Outcome: Progressing Goal: Understanding of medication regimen will improve Outcome: Progressing   Problem: Activity: Goal: Ability to tolerate increased activity will improve Outcome: Progressing   Problem: Cardiac: Goal: Ability to achieve and maintain adequate cardiopulmonary perfusion will improve Outcome: Progressing   Problem: Health Behavior/Discharge Planning: Goal: Ability to safely manage health-related needs after discharge will improve Outcome: Progressing   Problem: Education: Goal: Ability to demonstrate management of disease process will improve Outcome: Progressing Goal: Ability to verbalize understanding of medication therapies will improve Outcome: Progressing   Problem: Activity: Goal: Capacity to carry out activities will improve Outcome: Progressing
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
## 2023-11-09 NOTE — Progress Notes (Addendum)
 Progress Note  Patient Name: Rachel [name] Date of Encounter: 11/09/2023  Primary Cardiologist: [facility]  Subjective   Continues to require significant supplemental oxygen with HFNC. Feels like her dyspnea is improving. Given an additional Cardizem  CD 120 mg (260 mg total) yesterday. No chest pain, palpitations, dizziness, presyncope, or syncope. Continues to note RUQ pain with ultrasound without acute findings yesterday. Hemodynamically stable.    Inpatient Medications    Scheduled Meds:  acidophilus  1 capsule Oral BID   Chlorhexidine  Gluconate Cloth  6 each Topical Q0600   diltiazem   240 mg Oral Daily   feeding supplement  237 mL Oral BID BM   FLUoxetine   10 mg Oral Daily   gabapentin   100 mg Oral BID   ipratropium-albuterol   3 mL Nebulization Q6H   latanoprost   1 drop Both Eyes QHS   melatonin  5 mg Oral QHS   memantine   10 mg Oral BID   methylPREDNISolone  (SOLU-MEDROL ) injection  40 mg Intravenous Daily   multivitamin with minerals  1 tablet Oral Daily   mouth rinse  15 mL Mouth Rinse 4 times per day   pantoprazole   40 mg Oral Daily   umeclidinium-vilanterol  1 puff Inhalation Daily   zolpidem   5 mg Oral QHS   Continuous Infusions:  azithromycin  500 mg (11/08/23 1430)   cefTRIAXone  (ROCEPHIN )  IV 2 g (11/08/23 2056)   heparin  1,500 Units/hr (11/08/23 1627)   PRN Meds: acetaminophen  **OR** acetaminophen , albuterol , alum & mag hydroxide-simeth, ondansetron  **OR** ondansetron  (ZOFRAN ) IV, mouth rinse   Vital Signs    Vitals:   11/09/23 0500 11/09/23 0720 11/09/23 0731 11/09/23 0733  BP:  122/89    Pulse:  83    Resp:  17  15  Temp:  98.7 F (37.1 C)    TempSrc:      SpO2:  96% 94% 94%  Weight: 125.1 kg     Height:        Intake/Output Summary (Last 24 hours) at 11/09/2023 0820 Last data filed at 11/09/2023 0400 Gross per 24 hour  Intake 540 ml  Output 1000 ml  Net -460 ml   Filed Weights   11/07/23 0140 11/08/23 0307 11/09/23 0500  Weight: 125  kg 124.4 kg 125.1 kg    Telemetry    Atrial fibrillation rate 80s to low 100s bpm - Personally Reviewed  ECG    No new tracings - Personally Reviewed  Physical Exam   GEN: Obesity, alert, no acute distress.   Neck: Unable to estimate JVD. Cardiac: Irregularly irregular, no murmurs, rubs, or gallops.  Respiratory: Scattered wheezing and mildly diminished breath sounds bilaterally  GI: Soft, mild tenderness to palpitation of the RUQ, non-distended.   MS: No edema; No deformity. Neuro:  Alert and oriented x 3; Nonfocal.  Psych: Normal affect.  Labs    Chemistry Recent Labs  Lab 11/04/23 2114 11/05/23 0326 11/06/23 0423 11/07/23 0523 11/08/23 0354  NA 129*   < > 135 136 133*  K 3.8   < > 3.4* 3.4* 5.0  CL 95*   < > 100 102 102  CO2 21*   < > 24 25 22   GLUCOSE 160*   < > 137* 114* 148*  BUN 58*   < > 73* 76* 77*  CREATININE 1.62*   < > 1.66* 1.81* 1.78*  CALCIUM  9.1   < > 8.9 9.0 9.1  PROT 7.8  --   --   --   --   ALBUMIN  3.3*  --   --   --   --   AST 23  --   --   --   --   ALT 14  --   --   --   --   ALKPHOS 67  --   --   --   --   BILITOT 1.1  --   --   --   --   GFRNONAA 32*   < > 31* 28* 29*  ANIONGAP 13   < > 11 9 9    < > = values in this interval not displayed.     Hematology Recent Labs  Lab 11/07/23 0523 11/08/23 0354 11/09/23 0424  WBC 13.4* 12.2* 15.6*  RBC 3.31* 3.24* 3.22*  HGB 10.1* 9.8* 9.8*  HCT 31.0* 30.4* 30.3*  MCV 93.7 93.8 94.1  MCH 30.5 30.2 30.4  MCHC 32.6 32.2 32.3  RDW 14.0 14.1 14.3  PLT 209 222 238    Cardiac EnzymesNo results for input(s): TROPONINI in the last 168 hours. No results for input(s): TROPIPOC in the last 168 hours.   BNP Recent Labs  Lab 11/04/23 2114  BNP 347.2*     DDimer No results for input(s): DDIMER in the last 168 hours.   Radiology    DG Chest Port 1 View Result Date: 11/04/2023 IMPRESSION: Bilateral lower lobe airspace opacities could reflect edema or infection. Electronically Signed    By: Franky Crease M.D.   On: 11/04/2023 21:36    Cardiac Studies   2D echo 11/05/2023: 1. Left ventricular ejection fraction, by estimation, is 60 to 65%. The  left ventricle has normal function. The left ventricle has no regional  wall motion abnormalities. Left ventricular diastolic parameters are  indeterminate.   2. Right ventricular systolic function is normal. The right ventricular  size is normal. There is normal pulmonary artery systolic pressure. The  estimated right ventricular systolic pressure is 27.8 mmHg.   3. The mitral valve is normal in structure. No evidence of mitral valve  regurgitation. No evidence of mitral stenosis.   4. The aortic valve is normal in structure. Aortic valve regurgitation is  not visualized. No aortic stenosis is present.   5. The inferior vena cava is normal in size with greater than 50%  respiratory variability, suggesting right atrial pressure of 3 mmHg.   Patient Profile     [name] is a 78 year old woman with history of COPD, chronic respiratory failure on 3 to 4 L nasal cannula, hypertension, morbid obesity presenting from nursing facility November 04, 2023 with respiratory distress, tachycardia, atrial fibrillation with RVR, on BiPAP  Assessment & Plan    Atrial fibrillation with RVR -Timing of onset unclear, prior EKG July 2023 normal sinus rhythm -Remains in Afib with controlled ventricular response  -Increase Cardizem  CD to 360 mg daily -Continue heparin  gtt with recommendation to transition to DOAC prior to discharge  -Last resort would be amiodarone infusion though would prefer not to use amiodarone as she has not been fully anticoagulated and there would be risk of conversion/stroke -Anticipate DCCV in the outpatient setting once her acute illness is improved and following uninterrupted anticoagulation for 3-4 weeks unless she has difficult to control rates, at which time we would need to pursue TEE-guided DCCV prior to  discharge  -TSH normal   Acute respiratory distress -Improving -In the setting of atrial fibrillation with RVR rate 180 bpm on arrival with CAP, COPD exacerbation, and possible pneumonitis -Continues to require  significant supplemental oxygen with BiPAP/HFNC -Does not appear to be significantly volume up -Started on Solu-Medrol  on 6/27  -On Rocephin  and azithromycin   -IV fluids on hold   AKI: -Baseline unclear -Continue to hold IV Lasix   -Pending  Hypokalemia: -Repleted  -Magnesium  at goal on last check  Normocytic anemia: -Hgb low, though stable  RUQ pain: -Ultrasound without acute findings -Management per IM    For questions or updates, please contact [facility] Please consult [email or web address] for contact info under Cardiology/STEMI.   Signed, Bernardino Bring, PA-C [facility]

## 2023-11-10 ENCOUNTER — Encounter: Payer: Self-pay | Admitting: Internal Medicine

## 2023-11-10 DIAGNOSIS — I4891 Unspecified atrial fibrillation: Secondary | ICD-10-CM | POA: Diagnosis not present

## 2023-11-10 DIAGNOSIS — J9601 Acute respiratory failure with hypoxia: Secondary | ICD-10-CM | POA: Diagnosis not present

## 2023-11-10 LAB — BASIC METABOLIC PANEL WITH GFR
Anion gap: 12 (ref 5–15)
BUN: 81 mg/dL — ABNORMAL HIGH (ref 8–23)
CO2: 20 mmol/L — ABNORMAL LOW (ref 22–32)
Calcium: 9.6 mg/dL (ref 8.9–10.3)
Chloride: 104 mmol/L (ref 98–111)
Creatinine, Ser: 1.5 mg/dL — ABNORMAL HIGH (ref 0.44–1.00)
GFR, Estimated: 35 mL/min — ABNORMAL LOW (ref >60)
Glucose, Bld: 143 mg/dL — ABNORMAL HIGH (ref 70–99)
Potassium: 5.1 mmol/L (ref 3.5–5.1)
Sodium: 136 mmol/L (ref 135–145)

## 2023-11-10 LAB — CBC
HCT: 31.2 % — ABNORMAL LOW (ref 36.0–46.0)
Hemoglobin: 10 g/dL — ABNORMAL LOW (ref 12.0–15.0)
MCH: 31.1 pg (ref 26.0–34.0)
MCHC: 32.1 g/dL (ref 30.0–36.0)
MCV: 96.9 fL (ref 80.0–100.0)
Platelets: 257 10*3/uL (ref 150–400)
RBC: 3.22 MIL/uL — ABNORMAL LOW (ref 3.87–5.11)
RDW: 14.6 % (ref 11.5–15.5)
WBC: 19.5 10*3/uL — ABNORMAL HIGH (ref 4.0–10.5)
nRBC: 0.1 % (ref 0.0–0.2)

## 2023-11-10 LAB — HEPARIN LEVEL (UNFRACTIONATED): Heparin Unfractionated: 0.78 [IU]/mL — ABNORMAL HIGH (ref 0.30–0.70)

## 2023-11-10 MED ORDER — LEVALBUTEROL HCL 1.25 MG/0.5ML IN NEBU
1.2500 mg | INHALATION_SOLUTION | Freq: Three times a day (TID) | RESPIRATORY_TRACT | Status: DC
  Administered 2023-11-11 – 2023-11-13 (×7): 1.25 mg via RESPIRATORY_TRACT
  Filled 2023-11-10 (×8): qty 0.5

## 2023-11-10 MED ORDER — CHLORHEXIDINE GLUCONATE CLOTH 2 % EX PADS: 6.0000 | MEDICATED_PAD | Freq: Every day | CUTANEOUS | Status: DC

## 2023-11-10 MED ORDER — DILTIAZEM HCL 25 MG/5ML IV SOLN
10.0000 mg | Freq: Once | INTRAVENOUS | Status: AC
  Administered 2023-11-10: 10 mg via INTRAVENOUS
  Filled 2023-11-10: qty 5

## 2023-11-10 MED ORDER — APIXABAN 5 MG PO TABS
5.0000 mg | ORAL_TABLET | Freq: Two times a day (BID) | ORAL | Status: DC
  Administered 2023-11-10 – 2023-11-13 (×7): 5 mg via ORAL
  Filled 2023-11-10 (×7): qty 1

## 2023-11-10 MED ORDER — METOPROLOL TARTRATE 25 MG PO TABS
12.5000 mg | ORAL_TABLET | Freq: Two times a day (BID) | ORAL | Status: DC
  Administered 2023-11-10 – 2023-11-12 (×4): 12.5 mg via ORAL
  Filled 2023-11-10 (×4): qty 1

## 2023-11-10 MED ORDER — FUROSEMIDE 10 MG/ML IJ SOLN
40.0000 mg | Freq: Every day | INTRAMUSCULAR | Status: DC
  Administered 2023-11-10 – 2023-11-12 (×3): 40 mg via INTRAVENOUS
  Filled 2023-11-10 (×3): qty 4
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
## 2023-11-10 NOTE — Plan of Care (Signed)
 Telemetry and Resp notified of HR sustaining higher than baseline for the day.  This patient baseline has been uncontrolled afib all day (80-150), but rarely sustaining above 140s.  This RN noted that patient was now sustaining 130-170s.    Dr. Informed and confirmed BP as 158/121, so 10mg  Cardizem  IV push was order and given.  Though still uncontrolled afib, rate is now dropping below 100 and not sustaining above 130s.

## 2023-11-10 NOTE — Progress Notes (Signed)
 PROGRESS NOTE    Rachel [name]  FMW:[id number] DOB: 04/06/1946 DOA: 11/04/2023 PCP: Corlis Honor [name], MD    Brief Narrative:   78 y.o. female with medical history significant for COPD and chronic respiratory failure on home O2 at 3 to 4 L, HTN, morbid obesity, history of cochlear implants being admitted with multiple acute medical issues including worsening respiratory failure secondary to COPD and CHF, pneumonia and new onset A-fib with RVR.  She is currently on BiPAP.  She presented by EMS with CPAP in place after they received a call for acute respiratory distress and a fever.  With EMS she received IV Tylenol , Solu-Medrol , DuoNebs and an IV diltiazem  push.    Assessment & Plan:   Principal Problem:   Acute respiratory failure with hypoxia (HCC) Active Problems:   Acute on chronic respiratory failure with hypoxemia (HCC)   CAP (community acquired pneumonia)   Sepsis (HCC)   COPD exacerbation (HCC)   Atrial fibrillation with rapid ventricular response, new onset (HCC)   Acute on chronic diastolic CHF (congestive heart failure) (HCC)   Hyponatremia   Essential hypertension   Obesity, Class III, BMI 40-49.9 (morbid obesity)   Acute on chronic congestive heart failure (HCC)   Hypoxia   Right upper quadrant abdominal pain  Acute on chronic respiratory failure with hypoxemia (HCC) Secondary to CAP/sepsis, and CHF likely triggered by rapid A-fib PE among the differentials but lower suspicion.  Renal function precludes CTA. Required BiPAP at night.   Weaned to 5 L as of 6/28 Plan:  Continue high flow oxygen.  Wean as tolerated.  BiPAP as needed for naps and at night.  Consider restarting diuresis if kidney function allows.  Continue to wean oxygen as tolerated.  Goal saturation 88-92.  Aspiration event Rapid response called on 6/20 9 AM after patient choked on eggs IM and was found to be coughing with rapid atrial fibrillation heart rate up to 170s on monitor.  Responded to  bedside.  Patient was sitting up in bed.  Coughing substantially.  Able to speak and answer questions.  No signs of airway compromise. Plan: Seen by SLP.  No change in dietary consistency at this time.  Isolated aspiration event.  Ensure patient is sitting as upright as possible and remains upright for at least 30 minutes after eating.  Aspiration precautions   CAP (community acquired pneumonia) Sepsis COPD exacerbation Sepsis criteria include fever, tachycardia and tachypnea with leukocytosis and lactic acidosis, airspace disease on chest x-ray Received sepsis fluid bolus in the ED Plan: Complete course of antibiotics.  No IV fluids.  Continue bronchodilators.  DC Solu-Medrol  for now.  Consider restarting diuretics.  Atrial fibrillation with rapid ventricular response, new onset (HCC) Heart rate improved.  Remains on diltiazem  infusion. Plan: P.o. diltiazem  360 mg daily.  Transition to p.o. Eliquis today  Acute on chronic diastolic CHF (congestive heart failure) (HCC) BNP mildly elevated.  Chest x-ray with edema.  Received a dose of IV Lasix  in ED. Plan: Consider restarting Lasix .  Checking kidney function.   Hyponatremia Suspect hypervolemic hyponatremia in the setting of decompensated heart failure.   Careful on diuresis   Obesity, Class III, BMI 40-49.9 (morbid obesity) Complicating factor in overall care and prognosis   Essential hypertension Continue Cardizem  CD as above   DVT prophylaxis: Eliquis Code Status: DNR Family Communication: Sister via phone 6/25, 6/26, 6/28, 6/29 Disposition Plan: Status is: Inpatient Remains inpatient appropriate because: Multiple acute issues as above   Level of care:  Progressive  Consultants:  Cardiology-[facility]  Procedures:  None  Antimicrobials:   Subjective: Seen and examined.  Asks when she is leaving the hospital.  Objective: Vitals:   11/10/23 0600 11/10/23 0743 11/10/23 0745 11/10/23 1154  BP:   (!) 132/91 (!) 142/97   Pulse:   62 99  Resp: 20     Temp:   97.8 F (36.6 C) 97.9 F (36.6 C)  TempSrc:      SpO2:  94% 94% 96%  Weight:      Height:        Intake/Output Summary (Last 24 hours) at 11/10/2023 1312 Last data filed at 11/10/2023 0700 Gross per 24 hour  Intake 1113.23 ml  Output 1500 ml  Net -386.77 ml   Filed Weights   11/08/23 0307 11/09/23 0500 11/10/23 0500  Weight: 124.4 kg 125.1 kg 125.1 kg    Examination:  General exam: NAD.  Appears fatigued Respiratory system: Coarse breath sounds bilaterally.  Mild wheeze.  Normal work of breathing.  5 L Cardiovascular system: 1 S2, regular rate, irregular rhythm, no murmurs, trace pedal edema Gastrointestinal system: Soft, NT/ND, normal bowel sounds Central nervous system: Alert and oriented. No focal neurological deficits. Extremities: Symmetric 5 x 5 power. Skin: No rashes, lesions or ulcers Psychiatry: Judgement and insight appear normal. Mood & affect appropriate.     Data Reviewed: I have personally reviewed following labs and imaging studies  CBC: Recent Labs  Lab 11/04/23 2114 11/05/23 0326 11/06/23 0423 11/07/23 0523 11/08/23 0354 11/09/23 0424 11/10/23 0418  WBC 17.1*   < > 13.3* 13.4* 12.2* 15.6* 19.5*  NEUTROABS 14.1*  --   --   --   --   --   --   HGB 12.0   < > 10.4* 10.1* 9.8* 9.8* 10.0*  HCT 36.5   < > 32.1* 31.0* 30.4* 30.3* 31.2*  MCV 94.6   < > 93.9 93.7 93.8 94.1 96.9  PLT 217   < > 194 209 222 238 257   < > = values in this interval not displayed.   Basic Metabolic Panel: Recent Labs  Lab 11/05/23 0326 11/06/23 0423 11/07/23 0523 11/08/23 0354 11/09/23 0419  NA 133* 135 136 133* 136  K 3.8 3.4* 3.4* 5.0 4.8  CL 101 100 102 102 103  CO2 23 24 25 22 24   GLUCOSE 201* 137* 114* 148* 150*  BUN 61* 73* 76* 77* 76*  CREATININE 1.65* 1.66* 1.81* 1.78* 1.67*  CALCIUM  8.6* 8.9 9.0 9.1 9.3  MG  --  2.3 2.3 2.4 2.4   GFR: Estimated Creatinine Clearance: 37.5 mL/min (A) (by C-G formula based on SCr  of 1.67 mg/dL (H)). Liver Function Tests: Recent Labs  Lab 11/04/23 2114  AST 23  ALT 14  ALKPHOS 67  BILITOT 1.1  PROT 7.8  ALBUMIN 3.3*   No results for input(s): LIPASE, AMYLASE in the last 168 hours. No results for input(s): AMMONIA in the last 168 hours. Coagulation Profile: Recent Labs  Lab 11/04/23 2114  INR 1.3*   Cardiac Enzymes: No results for input(s): CKTOTAL, CKMB, CKMBINDEX, TROPONINI in the last 168 hours. BNP (last 3 results) No results for input(s): PROBNP in the last 8760 hours. HbA1C: No results for input(s): HGBA1C in the last 72 hours. CBG: Recent Labs  Lab 11/05/23 0027  GLUCAP 189*   Lipid Profile: No results for input(s): CHOL, HDL, LDLCALC, TRIG, CHOLHDL, LDLDIRECT in the last 72 hours. Thyroid Function Tests: No results for input(s): TSH, T4TOTAL,  FREET4, T3FREE, THYROIDAB in the last 72 hours.  Anemia Panel: No results for input(s): VITAMINB12, FOLATE, FERRITIN, TIBC, IRON, RETICCTPCT in the last 72 hours. Sepsis Labs: Recent Labs  Lab 11/04/23 2114 11/04/23 2300 11/05/23 0146 11/05/23 0326  PROCALCITON  --  3.76  --   --   LATICACIDVEN 2.6* 2.7* 2.7* 2.0*    Recent Results (from the past 240 hours)  Resp panel by RT-PCR (RSV, Flu A&B, Covid) Anterior Nasal Swab     Status: None   Collection Time: 11/04/23  9:14 PM   Specimen: Anterior Nasal Swab  Result Value Ref Range Status   SARS Coronavirus 2 by RT PCR NEGATIVE NEGATIVE Final    Comment: (NOTE) SARS-CoV-2 target nucleic acids are NOT DETECTED.  The SARS-CoV-2 RNA is generally detectable in upper respiratory specimens during the acute phase of infection. The lowest concentration of SARS-CoV-2 viral copies this assay can detect is 138 copies/mL. A negative result does not preclude SARS-Cov-2 infection and should not be used as the sole basis for treatment or other patient management decisions. A negative result may occur  with  improper specimen collection/handling, submission of specimen other than nasopharyngeal swab, presence of viral mutation(s) within the areas targeted by this assay, and inadequate number of viral copies(<138 copies/mL). A negative result must be combined with clinical observations, patient history, and epidemiological information. The expected result is Negative.  Fact Sheet for Patients:  BloggerCourse.com  Fact Sheet for Healthcare Providers:  SeriousBroker.it  This test is no t yet approved or cleared by the [place]  FDA and  has been authorized for detection and/or diagnosis of SARS-CoV-2 by FDA under an Emergency Use Authorization (EUA). This EUA will remain  in effect (meaning this test can be used) for the duration of the COVID-19 declaration under Section 564(b)(1) of the Act, 21 U.S.C.section 360bbb-3(b)(1), unless the authorization is terminated  or revoked sooner.       Influenza A by PCR NEGATIVE NEGATIVE Final   Influenza B by PCR NEGATIVE NEGATIVE Final    Comment: (NOTE) The Xpert Xpress SARS-CoV-2/FLU/RSV plus assay is intended as an aid in the diagnosis of influenza from Nasopharyngeal swab specimens and should not be used as a sole basis for treatment. Nasal washings and aspirates are unacceptable for Xpert Xpress SARS-CoV-2/FLU/RSV testing.  Fact Sheet for Patients: BloggerCourse.com  Fact Sheet for Healthcare Providers: SeriousBroker.it  This test is not yet approved or cleared by the [place]  FDA and has been authorized for detection and/or diagnosis of SARS-CoV-2 by FDA under an Emergency Use Authorization (EUA). This EUA will remain in effect (meaning this test can be used) for the duration of the COVID-19 declaration under Section 564(b)(1) of the Act, 21 U.S.C. section 360bbb-3(b)(1), unless the authorization is terminated  or revoked.     Resp Syncytial Virus by PCR NEGATIVE NEGATIVE Final    Comment: (NOTE) Fact Sheet for Patients: BloggerCourse.com  Fact Sheet for Healthcare Providers: SeriousBroker.it  This test is not yet approved or cleared by the [place]  FDA and has been authorized for detection and/or diagnosis of SARS-CoV-2 by FDA under an Emergency Use Authorization (EUA). This EUA will remain in effect (meaning this test can be used) for the duration of the COVID-19 declaration under Section 564(b)(1) of the Act, 21 U.S.C. section 360bbb-3(b)(1), unless the authorization is terminated or revoked.  Performed at [facility], [address], [place], [place] [address]   Blood Culture (routine x 2)     Status: None  Collection Time: 11/04/23  9:14 PM   Specimen: BLOOD  Result Value Ref Range Status   Specimen Description BLOOD BLOOD RIGHT ARM  Final   Special Requests   Final    BOTTLES DRAWN AEROBIC AND ANAEROBIC Blood Culture adequate volume   Culture   Final    NO GROWTH 5 DAYS Performed at [facility], [address] [place], [place] [address]    Report Status 11/09/2023 FINAL  Final  Blood Culture (routine x 2)     Status: None   Collection Time: 11/04/23  9:15 PM   Specimen: BLOOD  Result Value Ref Range Status   Specimen Description BLOOD BLOOD LEFT ARM  Final   Special Requests   Final    BOTTLES DRAWN AEROBIC AND ANAEROBIC Blood Culture results may not be optimal due to an inadequate volume of blood received in culture bottles   Culture   Final    NO GROWTH 5 DAYS Performed at [facility], [address], [place], [place] [address]    Report Status 11/09/2023 FINAL  Final  MRSA Next Gen by PCR, Nasal     Status: None   Collection Time: 11/05/23  1:04 AM   Specimen: Nasal Mucosa; Nasal Swab  Result Value Ref Range Status   MRSA by PCR Next Gen NOT DETECTED NOT DETECTED Final     Comment: (NOTE) The GeneXpert MRSA Assay (FDA approved for NASAL specimens only), is one component of a comprehensive MRSA colonization surveillance program. It is not intended to diagnose MRSA infection nor to guide or monitor treatment for MRSA infections. Test performance is not FDA approved in patients less than 68 years old. Performed at [facility], [address], [place], [place] [address]          Radiology Studies: US  Abdomen Limited RUQ (LIVER/GB) Result Date: 11/08/2023 CLINICAL DATA:  Right upper quadrant pain EXAM: ULTRASOUND ABDOMEN LIMITED RIGHT UPPER QUADRANT COMPARISON:  None Available. FINDINGS: Gallbladder: No gallstones or wall thickening visualized. No sonographic Murphy sign noted by sonographer. Common bile duct: Diameter: 3 mm. Liver: No focal lesion identified. Increased parenchymal echogenicity. Portal vein is patent on color Doppler imaging with normal direction of blood flow towards the liver. Other: None. IMPRESSION: 1. Hepatic steatosis. 2. No cholelithiasis or evidence of acute cholecystitis. Electronically Signed   By: Norman Gatlin M.D.   On: 11/08/2023 19:21        Scheduled Meds:  acidophilus  1 capsule Oral BID   apixaban  5 mg Oral BID   diltiazem   360 mg Oral Daily   feeding supplement  237 mL Oral BID BM   FLUoxetine   10 mg Oral Daily   gabapentin   100 mg Oral BID   ipratropium-albuterol   3 mL Nebulization Q6H   latanoprost   1 drop Both Eyes QHS   lidocaine  1 patch Transdermal Q24H   melatonin  5 mg Oral QHS   memantine   10 mg Oral BID   multivitamin with minerals  1 tablet Oral Daily   mouth rinse  15 mL Mouth Rinse 4 times per day   pantoprazole   40 mg Oral Daily   umeclidinium-vilanterol  1 puff Inhalation Daily   zolpidem   5 mg Oral QHS   Continuous Infusions:     LOS: 6 days     Calvin KATHEE Robson, MD [facility]   If 7PM-7AM, please contact night-coverage  11/10/2023, 1:12 PM

## 2023-11-10 NOTE — Progress Notes (Addendum)
 Occupational Therapy Treatment Patient Details Name: Rachel [name] MRN: [id number] DOB: November 15, 1945 Today's Date: 11/10/2023   History of present illness Pt is a 78 y.o. female presented on 11/04/23 by EMS they received a call for acute respiratory distress and a fever. On arrival pt was febrile, tachypneic, and had rapid A-fib being admitted with multiple acute medical issues including worsening respiratory failure secondary to COPD and CHF, pneumonia and new onset A-fib with RVR. PMH: COPD and chronic respiratory failure on home O2 at 3 to 4 L, HTN, morbid obesity, history of cochlear implants   OT comments  Pt is supine in bed on arrival with PT present, OT joining in for co-tx session to progress to OOB mobility and maximize endurance. Pt denies pain. She is on 3-4L at baseline, now requirining 6L at rest with sp02 at 89%. Increased to 7-8L HFNC for session. Pt performed supine to sit at EOB with Min A x2 this date with sp02 drop to 83%. Pt needs continued education on PLB throughout session and ultimately required 10L HFNC to maintain sp02 between 86-94%. Pt is hard to get an accurate sp02 reading on. HR ranging from low 100's to 140s with highest HR of 161 during transfer. Pt able to be weaned back to 6L at end of session. She remains with limited endurance, needs increased time between tasks to recover and frequent reminders for proper PLB techniques. Max A x2 to scoot to the back of the recliner. Pt was left seated in recliner with all needs in place and will cont to require skilled acute OT services to maximize her safety and IND to return to PLOF.       If plan is discharge home, recommend the following:  A lot of help with bathing/dressing/bathroom;A lot of help with walking and/or transfers   Equipment Recommendations  Other (comment) (defer to next venue)    Recommendations for Other Services      Precautions / Restrictions Precautions Precautions: Fall Recall of  Precautions/Restrictions: Impaired Precaution/Restrictions Comments: HFNC, HR Restrictions Weight Bearing Restrictions Per Provider Order: No       Mobility Bed Mobility Overal bed mobility: Needs Assistance Bed Mobility: Supine to Sit     Supine to sit: Min assist, +2 for physical assistance     General bed mobility comments: Min A x2 needed for safety with increased effort to reach EOB; able to forward scoot with CGA    Transfers Overall transfer level: Needs assistance Equipment used: Rolling walker (2 wheels) Transfers: Sit to/from Stand, Bed to chair/wheelchair/BSC Sit to Stand: Contact guard assist, +2 physical assistance     Step pivot transfers: Contact guard assist, +2 physical assistance     General transfer comment: CGA x2 for STS from EOB to RW and for step pivot to recliner; pt was increased from 6L HFNC to 10L for activity d/t drop to low 80's with bed mobility only. HT up to 161 with activity but varied a lot throughout session from low 100's to 140's     Balance Overall balance assessment: Needs assistance Sitting-balance support: Bilateral upper extremity supported, Feet supported Sitting balance-Leahy Scale: Fair Sitting balance - Comments: CGA for seated balance at EOB   Standing balance support: Bilateral upper extremity supported, Reliant on assistive device for balance Standing balance-Leahy Scale: Fair Standing balance comment: RW and CGA x2 for SPT  ADL either performed or assessed with clinical judgement   ADL                                              Extremity/Trunk Assessment              Vision       Perception     Praxis     Communication Communication Communication: Impaired Factors Affecting Communication: Hearing impaired   Cognition Arousal: Alert Behavior During Therapy: WFL for tasks assessed/performed                                  Following commands: Intact        Cueing   Cueing Techniques: Verbal cues, Gestural cues, Tactile cues  Exercises      Shoulder Instructions       General Comments 6L HFNC at rest with sp02 at 89%, increased to 8L and dropped to low 80s with bed mobility, increased to 10L for session to maintain upper 80's to 90's able to wean back to 6L with sp02 at rest at 94%; full linen change d/t soiled sheets    Pertinent Vitals/ Pain       Pain Assessment Pain Assessment: No/denies pain  Home Living                                          Prior Functioning/Environment              Frequency  Min 2X/week        Progress Toward Goals  OT Goals(current goals can now be found in the care plan section)  Progress towards OT goals: Progressing toward goals  Acute Rehab OT Goals Patient Stated Goal: improve breathing OT Goal Formulation: With patient Time For Goal Achievement: 11/20/23 Potential to Achieve Goals: Fair  Plan      Co-evaluation    PT/OT/SLP Co-Evaluation/Treatment: Yes Reason for Co-Treatment: To address functional/ADL transfers PT goals addressed during session: Mobility/safety with mobility;Proper use of DME OT goals addressed during session: ADL's and self-care      AM-PAC OT 6 Clicks Daily Activity     Outcome Measure   Help from another person eating meals?: None Help from another person taking care of personal grooming?: A Little Help from another person toileting, which includes using toliet, bedpan, or urinal?: A Lot Help from another person bathing (including washing, rinsing, drying)?: A Lot Help from another person to put on and taking off regular upper body clothing?: A Lot Help from another person to put on and taking off regular lower body clothing?: Total 6 Click Score: 14    End of Session Equipment Utilized During Treatment: Oxygen;Rolling walker (2 wheels) (HFNC)  OT Visit Diagnosis: Other abnormalities of  gait and mobility (R26.89);Muscle weakness (generalized) (M62.81)   Activity Tolerance Patient tolerated treatment well;Patient limited by fatigue   Patient Left in chair;with chair alarm set;with call bell/phone within reach   Nurse Communication Mobility status        Time: 9073-9045 OT Time Calculation (min): 28 min  Charges: OT General Charges $OT Visit: 1 Visit OT Treatments $Therapeutic Activity: 8-22 mins  Garris Melhorn, OTR/L  11/10/23, 12:56 PM  Takela Varden E Quetzal Meany 11/10/2023, 12:48 PM

## 2023-11-10 NOTE — Progress Notes (Signed)
 Rounding Note   Patient Name: Rachel [name] Date of Encounter: 11/10/2023  [facility] Cardiologist: None   Subjective Patient reports feeling well without chest pain, shortness of breath, and palpitations. Required BiPAP overnight, now on 5 L supplemental O2. She remains in atrial fibrillation with rates trending up today to 140s bpm.   Scheduled Meds:  acidophilus  1 capsule Oral BID   apixaban  5 mg Oral BID   diltiazem   360 mg Oral Daily   feeding supplement  237 mL Oral BID BM   FLUoxetine   10 mg Oral Daily   furosemide   40 mg Intravenous Daily   gabapentin   100 mg Oral BID   ipratropium-albuterol   3 mL Nebulization Q6H   latanoprost   1 drop Both Eyes QHS   lidocaine  1 patch Transdermal Q24H   melatonin  5 mg Oral QHS   memantine   10 mg Oral BID   multivitamin with minerals  1 tablet Oral Daily   mouth rinse  15 mL Mouth Rinse 4 times per day   pantoprazole   40 mg Oral Daily   umeclidinium-vilanterol  1 puff Inhalation Daily   zolpidem   5 mg Oral QHS   Continuous Infusions:  PRN Meds: acetaminophen  **OR** acetaminophen , albuterol , alum & mag hydroxide-simeth, ondansetron  **OR** ondansetron  (ZOFRAN ) IV, mouth rinse   Vital Signs  Vitals:   11/10/23 0600 11/10/23 0743 11/10/23 0745 11/10/23 1154  BP:   (!) 132/91 (!) 142/97  Pulse:   62 99  Resp: 20     Temp:   97.8 F (36.6 C) 97.9 F (36.6 C)  TempSrc:      SpO2:  94% 94% 96%  Weight:      Height:        Intake/Output Summary (Last 24 hours) at 11/10/2023 1425 Last data filed at 11/10/2023 0700 Gross per 24 hour  Intake 1113.23 ml  Output 1500 ml  Net -386.77 ml      11/10/2023    5:00 AM 11/09/2023    5:00 AM 11/08/2023    3:07 AM  Last 3 Weights  Weight (lbs) 275 lb 12.7 oz 275 lb 12.7 oz 274 lb 4 oz  Weight (kg) 125.1 kg 125.1 kg 124.4 kg      Telemetry Atrial fibrillation, rate 90s-140s bpm - Personally Reviewed  Physical Exam  GEN: No acute distress.   Neck: No  JVD Cardiac: IRIR, no murmurs, rubs, or gallops.  Respiratory: Clear to auscultation bilaterally. GI: Soft, nontender, non-distended  MS: No edema; No deformity. Neuro:  Nonfocal  Psych: Normal affect   Labs High Sensitivity Troponin:   Recent Labs  Lab 11/04/23 2114 11/04/23 2300  TROPONINIHS 44* 39*     Chemistry Recent Labs  Lab 11/04/23 2114 11/05/23 0326 11/07/23 0523 11/08/23 0354 11/09/23 0419 11/10/23 1332  NA 129*   < > 136 133* 136 136  K 3.8   < > 3.4* 5.0 4.8 5.1  CL 95*   < > 102 102 103 104  CO2 21*   < > 25 22 24  20*  GLUCOSE 160*   < > 114* 148* 150* 143*  BUN 58*   < > 76* 77* 76* 81*  CREATININE 1.62*   < > 1.81* 1.78* 1.67* 1.50*  CALCIUM  9.1   < > 9.0 9.1 9.3 9.6  MG  --    < > 2.3 2.4 2.4  --   PROT 7.8  --   --   --   --   --  ALBUMIN 3.3*  --   --   --   --   --   AST 23  --   --   --   --   --   ALT 14  --   --   --   --   --   ALKPHOS 67  --   --   --   --   --   BILITOT 1.1  --   --   --   --   --   GFRNONAA 32*   < > 28* 29* 31* 35*  ANIONGAP 13   < > 9 9 9 12    < > = values in this interval not displayed.    Lipids No results for input(s): CHOL, TRIG, HDL, LABVLDL, LDLCALC, CHOLHDL in the last 168 hours.  Hematology Recent Labs  Lab 11/08/23 0354 11/09/23 0424 11/10/23 0418  WBC 12.2* 15.6* 19.5*  RBC 3.24* 3.22* 3.22*  HGB 9.8* 9.8* 10.0*  HCT 30.4* 30.3* 31.2*  MCV 93.8 94.1 96.9  MCH 30.2 30.4 31.1  MCHC 32.2 32.3 32.1  RDW 14.1 14.3 14.6  PLT 222 238 257   Thyroid  Recent Labs  Lab 11/07/23 0623  TSH 0.848    BNP Recent Labs  Lab 11/04/23 2114  BNP 347.2*    DDimer No results for input(s): DDIMER in the last 168 hours.   Radiology  US  Abdomen Limited RUQ (LIVER/GB) Result Date: 11/08/2023 CLINICAL DATA:  Right upper quadrant pain EXAM: ULTRASOUND ABDOMEN LIMITED RIGHT UPPER QUADRANT COMPARISON:  None Available. FINDINGS: Gallbladder: No gallstones or wall thickening visualized. No sonographic  Murphy sign noted by sonographer. Common bile duct: Diameter: 3 mm. Liver: No focal lesion identified. Increased parenchymal echogenicity. Portal vein is patent on color Doppler imaging with normal direction of blood flow towards the liver. Other: None. IMPRESSION: 1. Hepatic steatosis. 2. No cholelithiasis or evidence of acute cholecystitis. Electronically Signed   By: Norman Gatlin M.D.   On: 11/08/2023 19:21    Cardiac Studies  2D echo 11/05/2023: 1. Left ventricular ejection fraction, by estimation, is 60 to 65%. The  left ventricle has normal function. The left ventricle has no regional  wall motion abnormalities. Left ventricular diastolic parameters are  indeterminate.   2. Right ventricular systolic function is normal. The right ventricular  size is normal. There is normal pulmonary artery systolic pressure. The  estimated right ventricular systolic pressure is 27.8 mmHg.   3. The mitral valve is normal in structure. No evidence of mitral valve  regurgitation. No evidence of mitral stenosis.   4. The aortic valve is normal in structure. Aortic valve regurgitation is  not visualized. No aortic stenosis is present.   5. The inferior vena cava is normal in size with greater than 50%  respiratory variability, suggesting right atrial pressure of 3 mmHg.   Patient Profile   [name] is a 78 year old woman with history of COPD, chronic respiratory failure on 3 to 4 L nasal cannula, hypertension, morbid obesity presenting from nursing facility November 04, 2023 with respiratory distress, tachycardia, atrial fibrillation with RVR, on BiPAP   Assessment & Plan   Atrial fibrillation with RVR - Initially presented 6/24 with respiratory distress - Found to be in atrial fibrillation with RVR, unknown chronicity - Transitioned from IV heparin  to Eliquis 5 mg twice daily - Remains in atrial fibrillation with rates trending up to 140s bpm today on Cardizem  CD 360 mg daily - Consider  transition back  to IV diltiazem  vs amiodarone with difficult to control rates, may require TEE/DCCV  Acute respiratory distress - In the setting of atrial fibrillation with RVR, CAP, COPD exacerbation, and possible pneumonitis - Appears euvolemic on exam although difficult to assess due to body habitus - Remains on 5 L supplemental O2, did require BiPAP overnight - Antibiotics and bronchodilators per IM  AKI - Unclear baseline kidney function - Continue to hold IV Lasix    For questions or updates, please contact [facility] Please consult [email or web address] for contact info under     Signed, Lesley LITTIE Maffucci, PA-C  11/10/2023, 2:25 PM

## 2023-11-10 NOTE — Progress Notes (Signed)
 Held treatment due to patients HR which was  138

## 2023-11-10 NOTE — Progress Notes (Signed)
 Physical Therapy Treatment Patient Details Name: Rachel [name] MRN: [id number] DOB: [birth year] Today's Date: 11/10/2023   History of Present Illness Pt is a 78 y.o. female presented on 11/04/23 by EMS they received a call for acute respiratory distress and a fever. On arrival pt was febrile, tachypneic, and had rapid A-fib being admitted with multiple acute medical issues including worsening respiratory failure secondary to COPD and CHF, pneumonia and new onset A-fib with RVR. PMH: COPD and chronic respiratory failure on home O2 at 3 to 4 L, HTN, morbid obesity, history of cochlear implants    PT Comments  Today's tx session involved supine therex and transfer to recliner to increase activity tolerance and improve overall strength. Pt vitals monitored throughout session, as HR extremely variable and O2 started at 6L.Pt successful with supine LE therex, minimal cueing needed to initiate movement. Bed mobility proved more difficult this date requiring min a x2 for facilitation of trunk with verbal cueing on limb manipulation. With bed mobility, pt SpO2 needed to be increased to 10L as desat to 86%, max HR 144 bpm. STS transfer successful with CGA x2 and use of RW, minimal verbal cueing on use of DME. With step pivot transfer, pt HR reaching 161 bpm with 92% SpO2. Pt left in recliner on 6L of O2 with 94% SpO2, HR varying in the 130s. Pt will continue to benefit from skilled PT interventions to meet goals and improve QoL. PT to follow acutely.   If plan is discharge home, recommend the following: Two people to help with walking and/or transfers;A lot of help with bathing/dressing/bathroom;Assistance with cooking/housework;Assist for transportation;Help with stairs or ramp for entrance   Can travel by private vehicle     No  Equipment Recommendations  None recommended by PT    Recommendations for Other Services       Precautions / Restrictions Precautions Precautions: Fall Recall of  Precautions/Restrictions: Impaired Precaution/Restrictions Comments: HFNC, HR Restrictions Weight Bearing Restrictions Per Provider Order: No     Mobility  Bed Mobility Overal bed mobility: Needs Assistance Bed Mobility: Supine to Sit     Supine to sit: Min assist, +2 for physical assistance     General bed mobility comments: increased time/effort to reach EOB with min a x2 for safety; monitoring vitals throughout, had to bump O2 from 6L to 10L with mobility, lowest saturation at 86%, max HR 144    Transfers Overall transfer level: Needs assistance Equipment used: Rolling walker (2 wheels) Transfers: Sit to/from Stand, Bed to chair/wheelchair/BSC Sit to Stand: Contact guard assist, +2 physical assistance   Step pivot transfers: Contact guard assist, +2 physical assistance       General transfer comment: verbal cueing throughout transitions, close vital monitoring, O2 sat dropping to 83% again, cued for pursed lip breathing, max HR 161 bpm once standing, no LOB, used RW    Ambulation/Gait               General Gait Details: Deferred due to medical status   Stairs             Wheelchair Mobility     Tilt Bed    Modified Rankin (Stroke Patients Only)       Balance Overall balance assessment: Needs assistance Sitting-balance support: Bilateral upper extremity supported, Feet supported Sitting balance-Leahy Scale: Fair Sitting balance - Comments: Steady static seated balance, reaching within BOS Postural control: Right lateral lean Standing balance support: Bilateral upper extremity supported Standing balance-Leahy Scale: Fair Standing balance comment:  use of RW, x2 CGA, no LOB                            Communication Communication Communication: Impaired Factors Affecting Communication: Hearing impaired  Cognition Arousal: Alert Behavior During Therapy: WFL for tasks assessed/performed   PT - Cognitive impairments: No apparent  impairments                         Following commands: Intact      Cueing Cueing Techniques: Verbal cues, Gestural cues, Tactile cues  Exercises General Exercises - Lower Extremity Ankle Circles/Pumps: AROM, Both, 15 reps, Supine Hip ABduction/ADduction: AROM, Both, 10 reps, Supine    General Comments        Pertinent Vitals/Pain Pain Assessment Pain Assessment: No/denies pain    Home Living                          Prior Function            PT Goals (current goals can now be found in the care plan section) Acute Rehab PT Goals Patient Stated Goal: To get stronger and return home PT Goal Formulation: With patient Time For Goal Achievement: 11/20/23 Potential to Achieve Goals: Fair Progress towards PT goals: Progressing toward goals    Frequency    Min 2X/week      PT Plan      Co-evaluation PT/OT/SLP Co-Evaluation/Treatment: Yes Reason for Co-Treatment: To address functional/ADL transfers PT goals addressed during session: Mobility/safety with mobility;Proper use of DME        AM-PAC PT 6 Clicks Mobility   Outcome Measure  Help needed turning from your back to your side while in a flat bed without using bedrails?: A Little Help needed moving from lying on your back to sitting on the side of a flat bed without using bedrails?: A Little Help needed moving to and from a bed to a chair (including a wheelchair)?: A Lot Help needed standing up from a chair using your arms (e.g., wheelchair or bedside chair)?: A Lot Help needed to walk in hospital room?: Total Help needed climbing 3-5 steps with a railing? : Total 6 Click Score: 12    End of Session Equipment Utilized During Treatment: Oxygen;Gait belt Activity Tolerance: Patient limited by fatigue;Treatment limited secondary to medical complications (Comment) Patient left: in chair;with call bell/phone within reach;with chair alarm set Nurse Communication: Mobility status PT Visit  Diagnosis: Muscle weakness (generalized) (M62.81);Other abnormalities of gait and mobility (R26.89)     Time: 9086-9046 PT Time Calculation (min) (ACUTE ONLY): 40 min  Charges:                               Kenleigh Toback Romero-Perozo, SPT  11/10/2023, 10:37 AM
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
## 2023-11-11 DIAGNOSIS — I4891 Unspecified atrial fibrillation: Secondary | ICD-10-CM | POA: Diagnosis not present

## 2023-11-11 DIAGNOSIS — J9601 Acute respiratory failure with hypoxia: Secondary | ICD-10-CM | POA: Diagnosis not present

## 2023-11-11 DIAGNOSIS — Z7189 Other specified counseling: Secondary | ICD-10-CM | POA: Diagnosis not present

## 2023-11-11 LAB — CBC
HCT: 31.3 % — ABNORMAL LOW (ref 36.0–46.0)
Hemoglobin: 9.9 g/dL — ABNORMAL LOW (ref 12.0–15.0)
MCH: 30.2 pg (ref 26.0–34.0)
MCHC: 31.6 g/dL (ref 30.0–36.0)
MCV: 95.4 fL (ref 80.0–100.0)
Platelets: 247 10*3/uL (ref 150–400)
RBC: 3.28 MIL/uL — ABNORMAL LOW (ref 3.87–5.11)
RDW: 14.8 % (ref 11.5–15.5)
WBC: 18.2 10*3/uL — ABNORMAL HIGH (ref 4.0–10.5)
nRBC: 0.1 % (ref 0.0–0.2)

## 2023-11-11 LAB — BASIC METABOLIC PANEL WITH GFR
Anion gap: 13 (ref 5–15)
BUN: 83 mg/dL — ABNORMAL HIGH (ref 8–23)
CO2: 23 mmol/L (ref 22–32)
Calcium: 9.4 mg/dL (ref 8.9–10.3)
Chloride: 104 mmol/L (ref 98–111)
Creatinine, Ser: 1.54 mg/dL — ABNORMAL HIGH (ref 0.44–1.00)
GFR, Estimated: 34 mL/min — ABNORMAL LOW (ref >60)
Glucose, Bld: 124 mg/dL — ABNORMAL HIGH (ref 70–99)
Potassium: 4.3 mmol/L (ref 3.5–5.1)
Sodium: 140 mmol/L (ref 135–145)

## 2023-11-11 LAB — MAGNESIUM: Magnesium: 2.3 mg/dL (ref 1.7–2.4)

## 2023-11-11 MED ORDER — METHYLPREDNISOLONE SODIUM SUCC 40 MG IJ SOLR
40.0000 mg | Freq: Every day | INTRAMUSCULAR | Status: DC
  Administered 2023-11-11 – 2023-11-13 (×3): 40 mg via INTRAVENOUS
  Filled 2023-11-11 (×3): qty 1

## 2023-11-11 NOTE — Progress Notes (Signed)
 PT Cancellation Note  Patient Details Name: WILDA WETHERELL MRN: [id number] DOB: 1945/12/19   Cancelled Treatment:     Pt resting in bed, declines PT session due to not feeling well. Slightly SOB on 6L HFNC, SpO2 91%. Will re-attempt next available date/time per POC.    Darice [name] Bohr 11/11/2023, 1:08 PM

## 2023-11-11 NOTE — Progress Notes (Signed)
 PROGRESS NOTE    Rachel [name]  FMW:[id number] DOB: 1945-08-25 DOA: 11/04/2023 PCP: Corlis Honor [name], MD    Brief Narrative:   78 y.o. female with medical history significant for COPD and chronic respiratory failure on home O2 at 3 to 4 L, HTN, morbid obesity, history of cochlear implants being admitted with multiple acute medical issues including worsening respiratory failure secondary to COPD and CHF, pneumonia and new onset A-fib with RVR.  She is currently on BiPAP.  She presented by EMS with CPAP in place after they received a call for acute respiratory distress and a fever.  With EMS she received IV Tylenol , Solu-Medrol , DuoNebs and an IV diltiazem  push.    Assessment & Plan:   Principal Problem:   Acute respiratory failure with hypoxia (HCC) Active Problems:   Acute on chronic respiratory failure with hypoxemia (HCC)   CAP (community acquired pneumonia)   Sepsis (HCC)   COPD exacerbation (HCC)   Atrial fibrillation with rapid ventricular response, new onset (HCC)   Acute on chronic diastolic CHF (congestive heart failure) (HCC)   Hyponatremia   Essential hypertension   Obesity, Class III, BMI 40-49.9 (morbid obesity)   Acute on chronic congestive heart failure (HCC)   Hypoxia   Right upper quadrant abdominal pain  Acute on chronic respiratory failure with hypoxemia (HCC) Secondary to CAP/sepsis, and CHF likely triggered by rapid A-fib PE among the differentials but lower suspicion.  Renal function precludes CTA. Required BiPAP at night.   Weaned to 5 L as of 6/28 Plan:  Continue high flow oxygen.  Wean as tolerated.  Continue diuresis for now however if kidney function continues to worsen will need to hold.  Goal saturation 88-90%  Aspiration event Rapid response called on 6/20 9 AM after patient choked on eggs IM and was found to be coughing with rapid atrial fibrillation heart rate up to 170s on monitor.  Responded to bedside.  Patient was sitting up in bed.   Coughing substantially.  Able to speak and answer questions.  No signs of airway compromise. Plan: Seen by SLP.  No change in dietary consistency at this time.  Isolated aspiration event.  Ensure patient is sitting as upright as possible and remains upright for at least 30 minutes after eating.  Aspiration precautions   CAP (community acquired pneumonia) Sepsis COPD exacerbation Sepsis criteria include fever, tachycardia and tachypnea with leukocytosis and lactic acidosis, airspace disease on chest x-ray Received sepsis fluid bolus in the ED Plan: Complete course of antibiotics.  No IV fluids.  Continue bronchodilators.  Steroids on hold for now.  Diuretics as above.  Atrial fibrillation with rapid ventricular response, new onset (HCC) Heart rate improved.  Remains on diltiazem  infusion. Plan: P.o. diltiazem  360 mg daily.  On low-dose metoprolol per cardiology.  Continue twice daily Eliquis  Acute on chronic diastolic CHF (congestive heart failure) (HCC) BNP mildly elevated.  Chest x-ray with edema.  Received a dose of IV Lasix  in ED. Plan: Lasix  resumed 6/30.  Monitor kidney function carefully   Hyponatremia Resolved.  Careful when on diuresis   Obesity, Class III, BMI 40-49.9 (morbid obesity) Complicating factor in overall care and prognosis   Essential hypertension Continue Cardizem  CD and metoprolol as above   DVT prophylaxis: Eliquis Code Status: DNR Family Communication: Darin Sora [phone] via phone 6/25, 6/26, 6/28, 6/29, 7/1 Disposition Plan: Status is: Inpatient Remains inpatient appropriate because: Multiple acute issues as above   Level of care: Progressive  Consultants:  Cardiology-[facility]  Procedures:  None  Antimicrobials:   Subjective: Seen and examined.  Much more fatigued this morning.  Objective: Vitals:   11/10/23 2318 11/11/23 0439 11/11/23 0500 11/11/23 0754  BP: 120/79 113/73  125/78  Pulse: 76 (!) 51  (!) 102  Resp: 20 16    Temp:  (!) 97.5 F (36.4 C) (!) 97.5 F (36.4 C)  (!) 97.3 F (36.3 C)  TempSrc:      SpO2: 98% 96%  94%  Weight:   120 kg   Height:       No intake or output data in the 24 hours ending 11/11/23 1148  Filed Weights   11/09/23 0500 11/10/23 0500 11/11/23 0500  Weight: 125.1 kg 125.1 kg 120 kg    Examination:  General exam: No acute distress.  Appears fatigued Respiratory system: Coarse breath sounds bilaterally.  Mild wheeze.  Normal work of breathing.  5 L Cardiovascular system: S1-S2, regular rate, irregular rhythm, no murmurs, trace pedal edema BLE Gastrointestinal system: Soft, NT/ND, normal bowel sounds Central nervous system: Alert and oriented. No focal neurological deficits. Extremities: Symmetric 5 x 5 power. Skin: No rashes, lesions or ulcers Psychiatry: Judgement and insight appear normal. Mood & affect appropriate.     Data Reviewed: I have personally reviewed following labs and imaging studies  CBC: Recent Labs  Lab 11/04/23 2114 11/05/23 0326 11/07/23 0523 11/08/23 0354 11/09/23 0424 11/10/23 0418 11/11/23 0414  WBC 17.1*   < > 13.4* 12.2* 15.6* 19.5* 18.2*  NEUTROABS 14.1*  --   --   --   --   --   --   HGB 12.0   < > 10.1* 9.8* 9.8* 10.0* 9.9*  HCT 36.5   < > 31.0* 30.4* 30.3* 31.2* 31.3*  MCV 94.6   < > 93.7 93.8 94.1 96.9 95.4  PLT 217   < > 209 222 238 257 247   < > = values in this interval not displayed.   Basic Metabolic Panel: Recent Labs  Lab 11/06/23 0423 11/07/23 0523 11/08/23 0354 11/09/23 0419 11/10/23 1332 11/11/23 1027  NA 135 136 133* 136 136 140  K 3.4* 3.4* 5.0 4.8 5.1 4.3  CL 100 102 102 103 104 104  CO2 24 25 22 24  20* 23  GLUCOSE 137* 114* 148* 150* 143* 124*  BUN 73* 76* 77* 76* 81* 83*  CREATININE 1.66* 1.81* 1.78* 1.67* 1.50* 1.54*  CALCIUM  8.9 9.0 9.1 9.3 9.6 9.4  MG 2.3 2.3 2.4 2.4  --  2.3   GFR: Estimated Creatinine Clearance: 39.7 mL/min (A) (by C-G formula based on SCr of 1.54 mg/dL (H)). Liver Function  Tests: Recent Labs  Lab 11/04/23 2114  AST 23  ALT 14  ALKPHOS 67  BILITOT 1.1  PROT 7.8  ALBUMIN 3.3*   No results for input(s): LIPASE, AMYLASE in the last 168 hours. No results for input(s): AMMONIA in the last 168 hours. Coagulation Profile: Recent Labs  Lab 11/04/23 2114  INR 1.3*   Cardiac Enzymes: No results for input(s): CKTOTAL, CKMB, CKMBINDEX, TROPONINI in the last 168 hours. BNP (last 3 results) No results for input(s): PROBNP in the last 8760 hours. HbA1C: No results for input(s): HGBA1C in the last 72 hours. CBG: Recent Labs  Lab 11/05/23 0027  GLUCAP 189*   Lipid Profile: No results for input(s): CHOL, HDL, LDLCALC, TRIG, CHOLHDL, LDLDIRECT in the last 72 hours. Thyroid Function Tests: No results for input(s): TSH, T4TOTAL, FREET4, T3FREE, THYROIDAB in the last 72 hours.  Anemia Panel: No results for input(s): VITAMINB12, FOLATE, FERRITIN, TIBC, IRON, RETICCTPCT in the last 72 hours. Sepsis Labs: Recent Labs  Lab 11/04/23 2114 11/04/23 2300 11/05/23 0146 11/05/23 0326  PROCALCITON  --  3.76  --   --   LATICACIDVEN 2.6* 2.7* 2.7* 2.0*    Recent Results (from the past 240 hours)  Resp panel by RT-PCR (RSV, Flu A&B, Covid) Anterior Nasal Swab     Status: None   Collection Time: 11/04/23  9:14 PM   Specimen: Anterior Nasal Swab  Result Value Ref Range Status   SARS Coronavirus 2 by RT PCR NEGATIVE NEGATIVE Final    Comment: (NOTE) SARS-CoV-2 target nucleic acids are NOT DETECTED.  The SARS-CoV-2 RNA is generally detectable in upper respiratory specimens during the acute phase of infection. The lowest concentration of SARS-CoV-2 viral copies this assay can detect is 138 copies/mL. A negative result does not preclude SARS-Cov-2 infection and should not be used as the sole basis for treatment or other patient management decisions. A negative result may occur with  improper specimen  collection/handling, submission of specimen other than nasopharyngeal swab, presence of viral mutation(s) within the areas targeted by this assay, and inadequate number of viral copies(<138 copies/mL). A negative result must be combined with clinical observations, patient history, and epidemiological information. The expected result is Negative.  Fact Sheet for Patients:  BloggerCourse.com  Fact Sheet for Healthcare Providers:  SeriousBroker.it  This test is no t yet approved or cleared by the [place]  FDA and  has been authorized for detection and/or diagnosis of SARS-CoV-2 by FDA under an Emergency Use Authorization (EUA). This EUA will remain  in effect (meaning this test can be used) for the duration of the COVID-19 declaration under Section 564(b)(1) of the Act, 21 U.S.C.section 360bbb-3(b)(1), unless the authorization is terminated  or revoked sooner.       Influenza A by PCR NEGATIVE NEGATIVE Final   Influenza B by PCR NEGATIVE NEGATIVE Final    Comment: (NOTE) The Xpert Xpress SARS-CoV-2/FLU/RSV plus assay is intended as an aid in the diagnosis of influenza from Nasopharyngeal swab specimens and should not be used as a sole basis for treatment. Nasal washings and aspirates are unacceptable for Xpert Xpress SARS-CoV-2/FLU/RSV testing.  Fact Sheet for Patients: BloggerCourse.com  Fact Sheet for Healthcare Providers: SeriousBroker.it  This test is not yet approved or cleared by the [place]  FDA and has been authorized for detection and/or diagnosis of SARS-CoV-2 by FDA under an Emergency Use Authorization (EUA). This EUA will remain in effect (meaning this test can be used) for the duration of the COVID-19 declaration under Section 564(b)(1) of the Act, 21 U.S.C. section 360bbb-3(b)(1), unless the authorization is terminated or revoked.     Resp Syncytial  Virus by PCR NEGATIVE NEGATIVE Final    Comment: (NOTE) Fact Sheet for Patients: BloggerCourse.com  Fact Sheet for Healthcare Providers: SeriousBroker.it  This test is not yet approved or cleared by the [place]  FDA and has been authorized for detection and/or diagnosis of SARS-CoV-2 by FDA under an Emergency Use Authorization (EUA). This EUA will remain in effect (meaning this test can be used) for the duration of the COVID-19 declaration under Section 564(b)(1) of the Act, 21 U.S.C. section 360bbb-3(b)(1), unless the authorization is terminated or revoked.  Performed at [facility], [address] [place], [place] [address]   Blood Culture (routine x 2)     Status: None   Collection Time: 11/04/23  9:14 PM  Specimen: BLOOD  Result Value Ref Range Status   Specimen Description BLOOD BLOOD RIGHT ARM  Final   Special Requests   Final    BOTTLES DRAWN AEROBIC AND ANAEROBIC Blood Culture adequate volume   Culture   Final    NO GROWTH 5 DAYS Performed at [facility], [address] [place], [place] [address]    Report Status 11/09/2023 FINAL  Final  Blood Culture (routine x 2)     Status: None   Collection Time: 11/04/23  9:15 PM   Specimen: BLOOD  Result Value Ref Range Status   Specimen Description BLOOD BLOOD LEFT ARM  Final   Special Requests   Final    BOTTLES DRAWN AEROBIC AND ANAEROBIC Blood Culture results may not be optimal due to an inadequate volume of blood received in culture bottles   Culture   Final    NO GROWTH 5 DAYS Performed at [facility], [address] [place], [place] [address]    Report Status 11/09/2023 FINAL  Final  MRSA Next Gen by PCR, Nasal     Status: None   Collection Time: 11/05/23  1:04 AM   Specimen: Nasal Mucosa; Nasal Swab  Result Value Ref Range Status   MRSA by PCR Next Gen NOT DETECTED NOT DETECTED Final    Comment: (NOTE) The GeneXpert MRSA  Assay (FDA approved for NASAL specimens only), is one component of a comprehensive MRSA colonization surveillance program. It is not intended to diagnose MRSA infection nor to guide or monitor treatment for MRSA infections. Test performance is not FDA approved in patients less than 46 years old. Performed at [facility], [address], [place], [place] [address]          Radiology Studies: No results found.       Scheduled Meds:  acidophilus  1 capsule Oral BID   apixaban  5 mg Oral BID   diltiazem   360 mg Oral Daily   feeding supplement  237 mL Oral BID BM   FLUoxetine   10 mg Oral Daily   furosemide   40 mg Intravenous Daily   gabapentin   100 mg Oral BID   latanoprost   1 drop Both Eyes QHS   levalbuterol  1.25 mg Nebulization TID   lidocaine  1 patch Transdermal Q24H   melatonin  5 mg Oral QHS   memantine   10 mg Oral BID   metoprolol tartrate  12.5 mg Oral BID   multivitamin with minerals  1 tablet Oral Daily   mouth rinse  15 mL Mouth Rinse 4 times per day   pantoprazole   40 mg Oral Daily   umeclidinium-vilanterol  1 puff Inhalation Daily   zolpidem   5 mg Oral QHS   Continuous Infusions:     LOS: 7 days     Calvin KATHEE Robson, MD [facility]   If 7PM-7AM, please contact night-coverage  11/11/2023, 11:48 AM

## 2023-11-11 NOTE — Progress Notes (Signed)
 Rounding Note   Patient Name: Rachel [name] Date of Encounter: 11/11/2023  [facility] Cardiologist: None   Subjective Patient denies chest pain, palpitations, and shortness of breath. She remains in atrial fibrillation with rates improving today at 70-100 bpm.   Scheduled Meds:  acidophilus  1 capsule Oral BID   apixaban  5 mg Oral BID   diltiazem   360 mg Oral Daily   feeding supplement  237 mL Oral BID BM   FLUoxetine   10 mg Oral Daily   furosemide   40 mg Intravenous Daily   gabapentin   100 mg Oral BID   latanoprost   1 drop Both Eyes QHS   levalbuterol  1.25 mg Nebulization TID   lidocaine  1 patch Transdermal Q24H   melatonin  5 mg Oral QHS   memantine   10 mg Oral BID   metoprolol tartrate  12.5 mg Oral BID   multivitamin with minerals  1 tablet Oral Daily   mouth rinse  15 mL Mouth Rinse 4 times per day   pantoprazole   40 mg Oral Daily   umeclidinium-vilanterol  1 puff Inhalation Daily   zolpidem   5 mg Oral QHS   Continuous Infusions:  PRN Meds: acetaminophen  **OR** acetaminophen , albuterol , alum & mag hydroxide-simeth, ondansetron  **OR** ondansetron  (ZOFRAN ) IV, mouth rinse   Vital Signs  Vitals:   11/10/23 2318 11/11/23 0439 11/11/23 0500 11/11/23 0754  BP: 120/79 113/73  125/78  Pulse: 76 (!) 51  (!) 102  Resp: 20 16    Temp: (!) 97.5 F (36.4 C) (!) 97.5 F (36.4 C)  (!) 97.3 F (36.3 C)  TempSrc:      SpO2: 98% 96%  94%  Weight:   120 kg   Height:       No intake or output data in the 24 hours ending 11/11/23 1022     11/11/2023    5:00 AM 11/10/2023    5:00 AM 11/09/2023    5:00 AM  Last 3 Weights  Weight (lbs) 264 lb 8.8 oz 275 lb 12.7 oz 275 lb 12.7 oz  Weight (kg) 120 kg 125.1 kg 125.1 kg      Telemetry Atrial fibrillation, rate 70-100 bpm - Personally Reviewed  Physical Exam  GEN: No acute distress.   Neck: No JVD Cardiac: IRIR, no murmurs, rubs, or gallops.  Respiratory: Faint expiratory wheezes heard throughout GI:  Soft, nontender, non-distended  MS: No edema; No deformity. Neuro:  Nonfocal  Psych: Normal affect   Labs High Sensitivity Troponin:   Recent Labs  Lab 11/04/23 2114 11/04/23 2300  TROPONINIHS 44* 39*     Chemistry Recent Labs  Lab 11/04/23 2114 11/05/23 0326 11/07/23 0523 11/08/23 0354 11/09/23 0419 11/10/23 1332  NA 129*   < > 136 133* 136 136  K 3.8   < > 3.4* 5.0 4.8 5.1  CL 95*   < > 102 102 103 104  CO2 21*   < > 25 22 24  20*  GLUCOSE 160*   < > 114* 148* 150* 143*  BUN 58*   < > 76* 77* 76* 81*  CREATININE 1.62*   < > 1.81* 1.78* 1.67* 1.50*  CALCIUM  9.1   < > 9.0 9.1 9.3 9.6  MG  --    < > 2.3 2.4 2.4  --   PROT 7.8  --   --   --   --   --   ALBUMIN 3.3*  --   --   --   --   --  AST 23  --   --   --   --   --   ALT 14  --   --   --   --   --   ALKPHOS 67  --   --   --   --   --   BILITOT 1.1  --   --   --   --   --   GFRNONAA 32*   < > 28* 29* 31* 35*  ANIONGAP 13   < > 9 9 9 12    < > = values in this interval not displayed.    Lipids No results for input(s): CHOL, TRIG, HDL, LABVLDL, LDLCALC, CHOLHDL in the last 168 hours.  Hematology Recent Labs  Lab 11/09/23 0424 11/10/23 0418 11/11/23 0414  WBC 15.6* 19.5* 18.2*  RBC 3.22* 3.22* 3.28*  HGB 9.8* 10.0* 9.9*  HCT 30.3* 31.2* 31.3*  MCV 94.1 96.9 95.4  MCH 30.4 31.1 30.2  MCHC 32.3 32.1 31.6  RDW 14.3 14.6 14.8  PLT 238 257 247   Thyroid  Recent Labs  Lab 11/07/23 0623  TSH 0.848    BNP Recent Labs  Lab 11/04/23 2114  BNP 347.2*    DDimer No results for input(s): DDIMER in the last 168 hours.   Radiology  No results found.   Cardiac Studies  2D echo 11/05/2023: 1. Left ventricular ejection fraction, by estimation, is 60 to 65%. The  left ventricle has normal function. The left ventricle has no regional  wall motion abnormalities. Left ventricular diastolic parameters are  indeterminate.   2. Right ventricular systolic function is normal. The right ventricular   size is normal. There is normal pulmonary artery systolic pressure. The  estimated right ventricular systolic pressure is 27.8 mmHg.   3. The mitral valve is normal in structure. No evidence of mitral valve  regurgitation. No evidence of mitral stenosis.   4. The aortic valve is normal in structure. Aortic valve regurgitation is  not visualized. No aortic stenosis is present.   5. The inferior vena cava is normal in size with greater than 50%  respiratory variability, suggesting right atrial pressure of 3 mmHg.   Patient Profile   [name] is a 78 year old woman with history of COPD, chronic respiratory failure on 3 to 4 L nasal cannula, hypertension, morbid obesity presenting from nursing facility November 04, 2023 with respiratory distress, tachycardia, atrial fibrillation with RVR, on BiPAP   Assessment & Plan   Atrial fibrillation with RVR - Initially presented 6/24 with respiratory distress - Found to be in atrial fibrillation with RVR, unknown chronicity - Remains in atrial fibrillation with rates improved today 70-100 bpm with the addition of metoprolol 12.5 twice daily; will continue this - Continue Cardizem  CD 360 mg daily - Continue Eliquis 5 mg twice daily  Acute respiratory distress - In the setting of atrial fibrillation with RVR, CAP, COPD exacerbation, and possible pneumonitis - Appears relatively euvolemic on exam although difficult to assess due to body habitus - Remains on 5 L supplemental O2, did require BiPAP overnight; lungs sound tight - Antibiotics, IV Lasix , and bronchodilators per IM  AKI - Unclear baseline kidney function - Continue to avoid nephrotoxic agents  For questions or updates, please contact [facility] Please consult [email or web address] for contact info under     Signed, Lesley LITTIE Maffucci, PA-C  11/11/2023, 10:22 AM

## 2023-11-11 NOTE — Progress Notes (Addendum)
 Daily Progress Note   Patient Name: Rachel [name]       Date: 11/11/2023 DOB: 07-16-1945  Age: 78 y.o. MRN#: [id number] Attending Physician: Jhonny Calvin NOVAK, MD Primary Care Physician: Corlis Honor [name], MD Admit Date: 11/04/2023  Reason for Consultation/Follow-up: Establishing goals of care  Subjective: Contacted by attending team to discuss patient's status.  Notes and labs reviewed.  Into see patient.  She states she has not been with physical therapy or occupational therapy and has been short of breath.   We discussed her diagnosis, prognosis, GOC, EOL wishes disposition and options.  Created space and opportunity for patient  to explore thoughts and feelings regarding current medical information.   A detailed discussion was had today regarding advanced directives.  Concepts specific to code status, artifical feeding and hydration, IV antibiotics and rehospitalization were discussed.  The difference between an aggressive medical intervention path and a comfort care path was discussed.  Values and goals of care important to patient and family were attempted to be elicited.  Discussed limitations of medical interventions to prolong quality of life in some situations and discussed the concept of human mortality.  She states she does not want to linger and is not afraid of death.  She states she is ready to die.  Patient states she would like to return to [facility] to spend what time she has left until she dies.  We discussed limited prognosis with shifting to full comfort care, and discussed continuing current therapies at this time while working to discharge back to [facility] with hospice.  She states she would like to speak with hospice liaison to arrange plans.  She states she is  now ready for healthcare power of attorney forms to be completed so that her sister is her surrogate Management consultant.  Spiritual care reconsulted.  Attending updated upon leaving bedside.  Length of Stay: 7  Current Medications: Scheduled Meds:   acidophilus  1 capsule Oral BID   apixaban  5 mg Oral BID   diltiazem   360 mg Oral Daily   feeding supplement  237 mL Oral BID BM   FLUoxetine   10 mg Oral Daily   furosemide   40 mg Intravenous Daily   gabapentin   100 mg Oral BID   latanoprost   1 drop Both Eyes QHS  levalbuterol  1.25 mg Nebulization TID   lidocaine  1 patch Transdermal Q24H   melatonin  5 mg Oral QHS   memantine   10 mg Oral BID   methylPREDNISolone  (SOLU-MEDROL ) injection  40 mg Intravenous Daily   metoprolol tartrate  12.5 mg Oral BID   multivitamin with minerals  1 tablet Oral Daily   mouth rinse  15 mL Mouth Rinse 4 times per day   pantoprazole   40 mg Oral Daily   umeclidinium-vilanterol  1 puff Inhalation Daily   zolpidem   5 mg Oral QHS    Continuous Infusions:   PRN Meds: acetaminophen  **OR** acetaminophen , albuterol , alum & mag hydroxide-simeth, ondansetron  **OR** ondansetron  (ZOFRAN ) IV, mouth rinse  Physical Exam Pulmonary:     Comments: Some work of breathing noted. Patient states she feels the same as she has been.  Neurological:     Mental Status: She is alert.             Vital Signs: BP (!) 140/75 (BP Location: Left Wrist)   Pulse 67   Temp 97.7 F (36.5 C)   Resp 16   Ht 5' 6 (1.676 m)   Wt 120 kg   SpO2 92%   BMI 42.70 kg/m  SpO2: SpO2: 92 % O2 Device: O2 Device: Nasal Cannula O2 Flow Rate: O2 Flow Rate (L/min): 5 L/min  Intake/output summary: No intake or output data in the 24 hours ending 11/11/23 1505 LBM: Last BM Date : 11/09/23 Baseline Weight: Weight: 121 kg Most recent weight: Weight: 120 kg        Patient Active Problem List   Diagnosis Date Noted   Right upper quadrant abdominal pain 11/08/2023   Acute on chronic  congestive heart failure (HCC) 11/05/2023   Hypoxia 11/05/2023   Atrial fibrillation with rapid ventricular response, new onset (HCC) 11/04/2023   CAP (community acquired pneumonia) 11/04/2023   Sepsis (HCC) 11/04/2023   Hyponatremia 11/04/2023   Obesity, Class III, BMI 40-49.9 (morbid obesity) 11/04/2023   Acute respiratory failure with hypoxia (HCC) 11/04/2023   Hyperkalemia 11/18/2021   Acute on chronic diastolic CHF (congestive heart failure) (HCC) 11/16/2021   Shingles 07/15/2019   Acute confusion 07/15/2019   AKI (acute kidney injury) (HCC) 07/15/2019   COPD exacerbation (HCC) 07/14/2019   Acute on chronic respiratory failure with hypoxemia (HCC) 09/10/2018   Shortness of breath 12/10/2017   Smoker 12/10/2017   Essential hypertension 12/10/2017   Morbid obesity (HCC) 12/10/2017   Bilateral hip pain 12/10/2017   Dyslipidemia 12/10/2017   Hard of hearing 12/10/2017   Facial abscess 12/24/2015   Open wound of left lower leg 12/29/2013   Leg laceration 12/23/2013   Open wound of knee, leg (except thigh), and ankle, complicated 02/01/2013   Wound of right leg 01/13/2013    Palliative Care Assessment & Plan     Recommendations/Plan: Patient would like to return to [facility] facility with hospice to follow  Code Status:    Code Status Orders  (From admission, onward)           Start     Ordered   11/04/23 2358  Do not attempt resuscitation (DNR)- Limited -Do Not Intubate (DNI)  Continuous       Question Answer Comment  If pulseless and not breathing No CPR or chest compressions.   In Pre-Arrest Conditions (Patient Is Breathing and Has A Pulse) Do not intubate. Provide all appropriate non-invasive medical interventions. Avoid ICU transfer unless indicated or required.   Consent: Discussion documented  in EHR or advanced directives reviewed      11/04/23 2359           Code Status History     Date Active Date Inactive Code Status Order ID Comments User Context    11/04/2023 2106 11/04/2023 2359 Partial Code [id number]  Suzanne Kirsch, MD ED   11/16/2021 1420 11/21/2021 0209 DNR [id number]  Tobie Calix, MD ED   11/16/2021 0436 11/16/2021 1420 Full Code [id number]  Cleatus Delayne GAILS, MD ED   07/15/2019 0010 07/20/2019 1906 Full Code [id number]  Cleatus Delayne GAILS, MD ED   09/10/2018 0346 09/11/2018 2043 Full Code [id number]  Stephania Ozell RAMAN Inpatient   12/24/2015 1734 12/27/2015 1502 Full Code [id number]  Sherial Bail, MD Inpatient      Advance Directive Documentation    Flowsheet Row Most Recent Value  Type of Advance Directive --  [Pt wants to wait until her sister gets here,  instructed her to let the nurse know to call the chaplain when she's ready to sign]  Pre-existing out of facility DNR order (yellow form or pink MOST form) --  MOST Form in Place? --    Thank you for allowing the Palliative Medicine Team to assist in the care of this patient.   Camelia Lewis, NP  Please contact Palliative Medicine Team phone at [phone] for questions and concerns.

## 2023-11-11 NOTE — Plan of Care (Signed)
  Problem: Education: Goal: Knowledge of General Education information will improve Description: Including pain rating scale, medication(s)/side effects and non-pharmacologic comfort measures 11/11/2023 1427 by Lynnette Cena CROME, RN Outcome: Progressing 11/11/2023 1426 by Lynnette Cena CROME, RN Outcome: Progressing   Problem: Health Behavior/Discharge Planning: Goal: Ability to manage health-related needs will improve Outcome: Progressing

## 2023-11-11 NOTE — Plan of Care (Signed)
  Problem: Education: Goal: Knowledge of General Education information will improve Description: Including pain rating scale, medication(s)/side effects and non-pharmacologic comfort measures Outcome: Progressing   Problem: Clinical Measurements: Goal: Will remain free from infection Outcome: Progressing Goal: Respiratory complications will improve Outcome: Progressing   Problem: Nutrition: Goal: Adequate nutrition will be maintained Outcome: Progressing   Problem: Pain Managment: Goal: General experience of comfort will improve and/or be controlled Outcome: Progressing   Problem: Safety: Goal: Ability to remain free from injury will improve Outcome: Progressing

## 2023-11-11 NOTE — Progress Notes (Signed)
 Visited with pt-educated pt on paperwork-pt asked to wait for sister to be in tomorrow to complete it and go over it again. Pt is wanting her sister to be listed on paper work. Chaplain services is available-informed pt to reach out to us  if she would like to complete in the morning tomorrow.

## 2023-11-11 NOTE — Plan of Care (Signed)
   Problem: Education: Goal: Knowledge of General Education information will improve Description Including pain rating scale, medication(s)/side effects and non-pharmacologic comfort measures Outcome: Progressing

## 2023-11-12 DIAGNOSIS — J9601 Acute respiratory failure with hypoxia: Secondary | ICD-10-CM | POA: Diagnosis not present

## 2023-11-12 DIAGNOSIS — I4891 Unspecified atrial fibrillation: Secondary | ICD-10-CM | POA: Diagnosis not present

## 2023-11-12 DIAGNOSIS — J9621 Acute and chronic respiratory failure with hypoxia: Secondary | ICD-10-CM

## 2023-11-12 DIAGNOSIS — J189 Pneumonia, unspecified organism: Secondary | ICD-10-CM | POA: Diagnosis not present

## 2023-11-12 DIAGNOSIS — Z7189 Other specified counseling: Secondary | ICD-10-CM | POA: Diagnosis not present

## 2023-11-12 LAB — BASIC METABOLIC PANEL WITH GFR
Anion gap: 9 (ref 5–15)
BUN: 94 mg/dL — ABNORMAL HIGH (ref 8–23)
CO2: 24 mmol/L (ref 22–32)
Calcium: 9.1 mg/dL (ref 8.9–10.3)
Chloride: 103 mmol/L (ref 98–111)
Creatinine, Ser: 1.71 mg/dL — ABNORMAL HIGH (ref 0.44–1.00)
GFR, Estimated: 30 mL/min — ABNORMAL LOW (ref >60)
Glucose, Bld: 162 mg/dL — ABNORMAL HIGH (ref 70–99)
Potassium: 5.4 mmol/L — ABNORMAL HIGH (ref 3.5–5.1)
Sodium: 136 mmol/L (ref 135–145)

## 2023-11-12 LAB — MAGNESIUM: Magnesium: 2.3 mg/dL (ref 1.7–2.4)

## 2023-11-12 MED ORDER — METOPROLOL TARTRATE 25 MG PO TABS
25.0000 mg | ORAL_TABLET | Freq: Two times a day (BID) | ORAL | Status: DC
  Administered 2023-11-12 – 2023-11-13 (×2): 25 mg via ORAL
  Filled 2023-11-12 (×2): qty 1

## 2023-11-12 NOTE — Progress Notes (Signed)
 OT Cancellation Note  Patient Details Name: Rachel [name] MRN: [id number] DOB: July 15, 1945   Cancelled Treatment:    Reason Eval/Treat Not Completed: Other (comment). Chart reviewed. Per palliative, pt hoping to return to [facility] with hospice following. Pt declines participation in therapy this date.  Josephine Wooldridge L. Mayline Dragon, OTR/L  11/12/23, 2:56 PM

## 2023-11-12 NOTE — TOC Progression Note (Addendum)
 Transition of Care [facility]) - Progression Note    Patient Details  Name: Rachel [name] MRN: [id number] Date of Birth: 11-05-1945  Transition of [facility]) CM/SW Contact  Lauraine [name] Carpen, LCSW Phone Number: 11/12/2023, 12:18 PM  Clinical Narrative:  Per palliative note, patient would like hospice services when she returns to [facility]. CSW met with patient and confirmed. [facility] is contracted with [facility] and [facility]. Patient does not have a preference. Made referral to [facility] with [facility]. SNF will still have to order bipap machine once discharge date determined.  1:05 pm: Left message for [facility] admissions coordinator asking him to start order for bipap.  Expected Discharge Plan: Skilled Nursing Facility Barriers to Discharge: Continued Medical Work up  Expected Discharge Plan and Services   Discharge Planning Services: CM Consult Post Acute Care Choice: Skilled Nursing Facility Living arrangements for the past 2 months: Skilled Nursing Facility                                       Social Determinants of Health (SDOH) Interventions SDOH Screenings   Food Insecurity: No Food Insecurity (11/10/2023)  Housing: Unknown (11/10/2023)  Transportation Needs: No Transportation Needs (11/10/2023)  Utilities: Patient Unable To Answer (11/05/2023)  Social Connections: Patient Unable To Answer (11/05/2023)  Tobacco Use: Medium Risk (11/10/2023)    Readmission Risk Interventions     No data to display

## 2023-11-12 NOTE — Plan of Care (Signed)
   Problem: Education: Goal: Knowledge of General Education information will improve Description Including pain rating scale, medication(s)/side effects and non-pharmacologic comfort measures Outcome: Progressing   Problem: Health Behavior/Discharge Planning: Goal: Ability to manage health-related needs will improve Outcome: Progressing

## 2023-11-12 NOTE — Progress Notes (Addendum)
 [facility] Liaison Note  Received request from Transitions of Care Manager,Sarah Boswell, LCSW, for hospice services at [facility] after discharge.  Voice mail message left with son, Azzie Thiem, asking that he call [facility] to go over hospice services in a LTC facility. Met with patient today and informed her about hospice services in a LTC facility.  Patient agreeable to having hospice follow up at [facility].   Facility to provide needed DME.    Please send signed and completed DNR LTC with patient/family. Please provide prescriptions at discharge as needed to ensure ongoing symptom management.    [facility] information and contact numbers given to family & above information shared with TOC.   Please call with any questions/concerns.    Thank you for the opportunity to participate in this patient's care.  [facility] Liaison [phone]

## 2023-11-12 NOTE — NC FL2 (Signed)
 [place]  MEDICAID FL2 LEVEL OF CARE FORM     IDENTIFICATION  Patient Name: Rachel [name] Birthdate: [birth year] Sex: female Admission Date (Current Location): 11/04/2023  [place] and [place] Number:  [place] [id number] Facility and Address:  [facility], [address], [place], [place] [address]      Provider Number: [id number]  Attending Physician Name and Address:  Maree Hue, MD  Relative Name and Phone Number:  Louellen Haldeman [phone]    Current Level of Care: Hospital Recommended Level of Care: Skilled Nursing Facility (with hospice through [facility]) Prior Approval Number:    Date Approved/Denied:   PASRR Number: [id number]  Discharge Plan: SNF (with hospice through [facility])    Current Diagnoses: Patient Active Problem List   Diagnosis Date Noted   Right upper quadrant abdominal pain 11/08/2023   Acute on chronic congestive heart failure (HCC) 11/05/2023   Hypoxia 11/05/2023   Atrial fibrillation with rapid ventricular response, new onset (HCC) 11/04/2023   CAP (community acquired pneumonia) 11/04/2023   Sepsis (HCC) 11/04/2023   Hyponatremia 11/04/2023   Obesity, Class III, BMI 40-49.9 (morbid obesity) 11/04/2023   Acute respiratory failure with hypoxia (HCC) 11/04/2023   Hyperkalemia 11/18/2021   Acute on chronic diastolic CHF (congestive heart failure) (HCC) 11/16/2021   Shingles 07/15/2019   Acute confusion 07/15/2019   AKI (acute kidney injury) (HCC) 07/15/2019   COPD exacerbation (HCC) 07/14/2019   Acute on chronic respiratory failure with hypoxemia (HCC) 09/10/2018   Shortness of breath 12/10/2017   Smoker 12/10/2017   Essential hypertension 12/10/2017   Morbid obesity (HCC) 12/10/2017   Bilateral hip pain 12/10/2017   Dyslipidemia 12/10/2017   Hard of hearing 12/10/2017   Facial abscess 12/24/2015   Open wound of left lower leg 12/29/2013   Leg laceration 12/23/2013   Open wound of knee, leg (except  thigh), and ankle, complicated 02/01/2013   Wound of right leg 01/13/2013    Orientation RESPIRATION BLADDER Height & Weight     Self, Time, Situation, Place  Other (Comment), O2 (5 L HFNC. Weaning as tolerated. Bipap QHS: 10/5 at 40%.) Incontinent Weight: 278 lb 10.6 oz (126.4 kg) Height:  5' 6 (167.6 cm)  BEHAVIORAL SYMPTOMS/MOOD NEUROLOGICAL BOWEL NUTRITION STATUS   (None)   Continent Diet (2 gram sodium)  AMBULATORY STATUS COMMUNICATION OF NEEDS Skin   Extensive Assist Verbally Bruising, Other (Comment) (Erythema/redness.)                       Personal Care Assistance Level of Assistance  Bathing, Feeding, Dressing Bathing Assistance: Limited assistance Feeding assistance: Limited assistance Dressing Assistance: Limited assistance     Functional Limitations Info  Sight, Hearing, Speech Sight Info: Adequate Hearing Info: Adequate Speech Info: Adequate    SPECIAL CARE FACTORS FREQUENCY                   Contractures Contractures Info: Not present    Additional Factors Info  Code Status, Allergies Code Status Info: DNR Allergies Info: NKDA           Current Medications (11/12/2023):  This is the current hospital active medication list Current Facility-Administered Medications  Medication Dose Route Frequency Provider Last Rate Last Admin   acetaminophen  (TYLENOL ) tablet 650 mg  650 mg Oral Q6H PRN Duncan, Hazel V, MD   650 mg at 11/12/23 1026   Or   acetaminophen  (TYLENOL ) suppository 650 mg  650 mg Rectal Q6H PRN Cleatus Delayne GAILS, MD  acidophilus (RISAQUAD) capsule 1 capsule  1 capsule Oral BID Sreenath, Sudheer B, MD   1 capsule at 11/12/23 1013   albuterol  (PROVENTIL ) (2.5 MG/3ML) 0.083% nebulizer solution 2.5 mg  2.5 mg Nebulization Q2H PRN Duncan, Hazel V, MD   2.5 mg at 11/09/23 0933   alum & mag hydroxide-simeth (MAALOX/MYLANTA) 200-200-20 MG/5ML suspension 30 mL  30 mL Oral Q4H PRN Sreenath, Sudheer B, MD   30 mL at 11/06/23 1931   apixaban  (ELIQUIS) tablet 5 mg  5 mg Oral BID Sreenath, Sudheer B, MD   5 mg at 11/12/23 1012   diltiazem  (CARDIZEM  CD) 24 hr capsule 360 mg  360 mg Oral Daily Abigail Motto M, PA-C   360 mg at 11/12/23 1013   feeding supplement (ENSURE PLUS HIGH PROTEIN) liquid 237 mL  237 mL Oral BID BM Sreenath, Sudheer B, MD   237 mL at 11/12/23 1013   FLUoxetine  (PROZAC ) capsule 10 mg  10 mg Oral Daily Sreenath, Sudheer B, MD   10 mg at 11/12/23 1012   gabapentin  (NEURONTIN ) capsule 100 mg  100 mg Oral BID Sreenath, Sudheer B, MD   100 mg at 11/12/23 1012   latanoprost  (XALATAN ) 0.005 % ophthalmic solution 1 drop  1 drop Both Eyes QHS Sreenath, Sudheer B, MD   1 drop at 11/11/23 2106   levalbuterol (XOPENEX) nebulizer solution 1.25 mg  1.25 mg Nebulization TID Sreenath, Sudheer B, MD   1.25 mg at 11/12/23 0802   lidocaine (LIDODERM) 5 % 1 patch  1 patch Transdermal Q24H Sreenath, Sudheer B, MD   1 patch at 11/10/23 0908   melatonin tablet 5 mg  5 mg Oral QHS Sreenath, Sudheer B, MD   5 mg at 11/11/23 2106   memantine  (NAMENDA ) tablet 10 mg  10 mg Oral BID Sreenath, Sudheer B, MD   10 mg at 11/12/23 1012   methylPREDNISolone  sodium succinate (SOLU-MEDROL ) 40 mg/mL injection 40 mg  40 mg Intravenous Daily Sreenath, Sudheer B, MD   40 mg at 11/12/23 1012   metoprolol tartrate (LOPRESSOR) tablet 25 mg  25 mg Oral BID Lorene Sinclair L, PA-C       multivitamin with minerals tablet 1 tablet  1 tablet Oral Daily Sreenath, Sudheer B, MD   1 tablet at 11/12/23 1012   ondansetron  (ZOFRAN ) tablet 4 mg  4 mg Oral Q6H PRN Duncan, Hazel V, MD       Or   ondansetron  (ZOFRAN ) injection 4 mg  4 mg Intravenous Q6H PRN Duncan, Hazel V, MD   4 mg at 11/05/23 1504   Oral care mouth rinse  15 mL Mouth Rinse 4 times per day Cleatus Delayne GAILS, MD   15 mL at 11/12/23 0815   Oral care mouth rinse  15 mL Mouth Rinse PRN Cleatus Delayne GAILS, MD       pantoprazole  (PROTONIX ) EC tablet 40 mg  40 mg Oral Daily Sreenath, Sudheer B, MD   40 mg at 11/12/23 1012    umeclidinium-vilanterol (ANORO ELLIPTA ) 62.5-25 MCG/ACT 1 puff  1 puff Inhalation Daily Sreenath, Sudheer B, MD   1 puff at 11/12/23 1014   zolpidem  (AMBIEN ) tablet 5 mg  5 mg Oral QHS Sreenath, Sudheer B, MD   5 mg at 11/11/23 2108     Discharge Medications: Please see discharge summary for a list of discharge medications.  Relevant Imaging Results:  Relevant Lab Results:   Additional Information SS#: [id number]  Lauraine [name] Carpen, LCSW

## 2023-11-12 NOTE — Progress Notes (Signed)
 PROGRESS NOTE    IZELLA [name]  FMW:[id number] DOB: 30-Jan-1946 DOA: 11/04/2023 PCP: Corlis Honor [name], MD    Brief Narrative:   78 y.o. female with medical history significant for COPD and chronic respiratory failure on home O2 at 3 to 4 L, HTN, morbid obesity, history of cochlear implants being admitted with multiple acute medical issues including worsening respiratory failure secondary to COPD and CHF, pneumonia and new onset A-fib with RVR.  She is currently on BiPAP.  She presented by EMS with CPAP in place after they received a call for acute respiratory distress and a fever.  With EMS she received IV Tylenol , Solu-Medrol , DuoNebs and an IV diltiazem  push.   7/2: Planning for hospice at the facility/long-term care   Assessment & Plan:   Principal Problem:   Acute respiratory failure with hypoxia (HCC) Active Problems:   Acute on chronic respiratory failure with hypoxemia (HCC)   CAP (community acquired pneumonia)   Sepsis (HCC)   COPD exacerbation (HCC)   Atrial fibrillation with rapid ventricular response, new onset (HCC)   Acute on chronic diastolic CHF (congestive heart failure) (HCC)   Hyponatremia   Essential hypertension   Obesity, Class III, BMI 40-49.9 (morbid obesity)   Acute on chronic congestive heart failure (HCC)   Hypoxia   Right upper quadrant abdominal pain  Acute on chronic respiratory failure with hypoxemia (HCC) Secondary to CAP/sepsis, and CHF likely triggered by rapid A-fib PE among the differentials but lower suspicion.  Renal function precludes CTA. Required BiPAP at night.   Weaned to 5 L as of 6/28 Plan for hospice at the [facility]  Aspiration event Rapid response called on 6/20 9 AM after patient choked on eggs IM and was found to be coughing with rapid atrial fibrillation heart rate up to 170s on monitor.  Responded to bedside.  Patient was sitting up in bed.  Coughing substantially.  Able to speak and answer questions.  No signs of  airway compromise. Plan: Seen by SLP.  No change in dietary consistency at this time.  Isolated aspiration event.  Ensure patient is sitting as upright as possible and remains upright for at least 30 minutes after eating.  Aspiration precautions   CAP (community acquired pneumonia) Sepsis COPD exacerbation Sepsis criteria include fever, tachycardia and tachypnea with leukocytosis and lactic acidosis, airspace disease on chest x-ray Received sepsis fluid bolus in the ED Plan: Complete course of antibiotics.  No IV fluids.  Continue bronchodilators.  Steroids on hold for now.  Diuretics as above.  Atrial fibrillation with rapid ventricular response, new onset (HCC) Heart rate improved.  Remains on diltiazem  infusion. Plan: P.o. diltiazem  360 mg daily.  On low-dose metoprolol per cardiology.  Continue twice daily Eliquis  Acute on chronic diastolic CHF (congestive heart failure) (HCC) BNP mildly elevated.  Chest x-ray with edema.  Received a dose of IV Lasix  in ED. Plan: Lasix  resumed 6/30.  Monitor kidney function carefully   Hyponatremia Resolved.  Careful when on diuresis   Obesity, Class III, BMI 40-49.9 (morbid obesity) Complicating factor in overall care and prognosis   Essential hypertension Continue Cardizem  CD and metoprolol as above   DVT prophylaxis: Eliquis Code Status: DNR Family Communication: Darin Sora [phone] via phone 6/25, 6/26, 6/28, 6/29, 7/1.  None at bedside today Disposition Plan: Status is: Inpatient Remains inpatient appropriate because: Multiple acute issues as above.  TOC coordinating for BiPAP at the facility before she could be discharged under hospice   Level of  care: Progressive  Consultants:  Cardiology-[facility]   Subjective:  Agreeable with hospice upon discharge tomorrow  Objective: Vitals:   11/12/23 0753 11/12/23 0802 11/12/23 1140 11/12/23 1652  BP: (!) 119/94  124/85 116/70  Pulse: 83  92 70  Resp: 16   20  Temp: 97.7 F  (36.5 C)  97.6 F (36.4 C) (!) 97.5 F (36.4 C)  TempSrc:    Oral  SpO2: 96% 95% 96% 94%  Weight:      Height:        Intake/Output Summary (Last 24 hours) at 11/12/2023 1904 Last data filed at 11/12/2023 1718 Gross per 24 hour  Intake 710 ml  Output 1750 ml  Net -1040 ml    Filed Weights   11/10/23 0500 11/11/23 0500 11/12/23 0510  Weight: 125.1 kg 120 kg 126.4 kg    Examination:  General exam: No acute distress.  Appears fatigued Respiratory system: Coarse breath sounds bilaterally.  Mild wheeze.  Normal work of breathing.  5 L Cardiovascular system: S1-S2, regular rate, irregular rhythm, no murmurs, trace pedal edema BLE Gastrointestinal system: Soft, NT/ND, normal bowel sounds Central nervous system: Alert and oriented. No focal neurological deficits. Extremities: Symmetric 5 x 5 power. Skin: No rashes, lesions or ulcers Psychiatry: Judgement and insight appear normal. Mood & affect appropriate.     Data Reviewed: I have personally reviewed following labs and imaging studies  CBC: Recent Labs  Lab 11/07/23 0523 11/08/23 0354 11/09/23 0424 11/10/23 0418 11/11/23 0414  WBC 13.4* 12.2* 15.6* 19.5* 18.2*  HGB 10.1* 9.8* 9.8* 10.0* 9.9*  HCT 31.0* 30.4* 30.3* 31.2* 31.3*  MCV 93.7 93.8 94.1 96.9 95.4  PLT 209 222 238 257 247   Basic Metabolic Panel: Recent Labs  Lab 11/07/23 0523 11/08/23 0354 11/09/23 0419 11/10/23 1332 11/11/23 1027 11/12/23 0343  NA 136 133* 136 136 140 136  K 3.4* 5.0 4.8 5.1 4.3 5.4*  CL 102 102 103 104 104 103  CO2 25 22 24  20* 23 24  GLUCOSE 114* 148* 150* 143* 124* 162*  BUN 76* 77* 76* 81* 83* 94*  CREATININE 1.81* 1.78* 1.67* 1.50* 1.54* 1.71*  CALCIUM  9.0 9.1 9.3 9.6 9.4 9.1  MG 2.3 2.4 2.4  --  2.3 2.3      Recent Results (from the past 240 hours)  Resp panel by RT-PCR (RSV, Flu A&B, Covid) Anterior Nasal Swab     Status: None   Collection Time: 11/04/23  9:14 PM   Specimen: Anterior Nasal Swab  Result Value Ref  Range Status   SARS Coronavirus 2 by RT PCR NEGATIVE NEGATIVE Final    Comment: (NOTE) SARS-CoV-2 target nucleic acids are NOT DETECTED.  The SARS-CoV-2 RNA is generally detectable in upper respiratory specimens during the acute phase of infection. The lowest concentration of SARS-CoV-2 viral copies this assay can detect is 138 copies/mL. A negative result does not preclude SARS-Cov-2 infection and should not be used as the sole basis for treatment or other patient management decisions. A negative result may occur with  improper specimen collection/handling, submission of specimen other than nasopharyngeal swab, presence of viral mutation(s) within the areas targeted by this assay, and inadequate number of viral copies(<138 copies/mL). A negative result must be combined with clinical observations, patient history, and epidemiological information. The expected result is Negative.  Fact Sheet for Patients:  BloggerCourse.com  Fact Sheet for Healthcare Providers:  SeriousBroker.it  This test is no t yet approved or cleared by the [place]  FDA and  has been authorized for detection and/or diagnosis of SARS-CoV-2 by FDA under an Emergency Use Authorization (EUA). This EUA will remain  in effect (meaning this test can be used) for the duration of the COVID-19 declaration under Section 564(b)(1) of the Act, 21 U.S.C.section 360bbb-3(b)(1), unless the authorization is terminated  or revoked sooner.       Influenza A by PCR NEGATIVE NEGATIVE Final   Influenza B by PCR NEGATIVE NEGATIVE Final    Comment: (NOTE) The Xpert Xpress SARS-CoV-2/FLU/RSV plus assay is intended as an aid in the diagnosis of influenza from Nasopharyngeal swab specimens and should not be used as a sole basis for treatment. Nasal washings and aspirates are unacceptable for Xpert Xpress SARS-CoV-2/FLU/RSV testing.  Fact Sheet for  Patients: BloggerCourse.com  Fact Sheet for Healthcare Providers: SeriousBroker.it  This test is not yet approved or cleared by the [place]  FDA and has been authorized for detection and/or diagnosis of SARS-CoV-2 by FDA under an Emergency Use Authorization (EUA). This EUA will remain in effect (meaning this test can be used) for the duration of the COVID-19 declaration under Section 564(b)(1) of the Act, 21 U.S.C. section 360bbb-3(b)(1), unless the authorization is terminated or revoked.     Resp Syncytial Virus by PCR NEGATIVE NEGATIVE Final    Comment: (NOTE) Fact Sheet for Patients: BloggerCourse.com  Fact Sheet for Healthcare Providers: SeriousBroker.it  This test is not yet approved or cleared by the [place]  FDA and has been authorized for detection and/or diagnosis of SARS-CoV-2 by FDA under an Emergency Use Authorization (EUA). This EUA will remain in effect (meaning this test can be used) for the duration of the COVID-19 declaration under Section 564(b)(1) of the Act, 21 U.S.C. section 360bbb-3(b)(1), unless the authorization is terminated or revoked.  Performed at [facility], [address] [place], [place] [address]   Blood Culture (routine x 2)     Status: None   Collection Time: 11/04/23  9:14 PM   Specimen: BLOOD  Result Value Ref Range Status   Specimen Description BLOOD BLOOD RIGHT ARM  Final   Special Requests   Final    BOTTLES DRAWN AEROBIC AND ANAEROBIC Blood Culture adequate volume   Culture   Final    NO GROWTH 5 DAYS Performed at [facility], [address] [place], [place] [address]    Report Status 11/09/2023 FINAL  Final  Blood Culture (routine x 2)     Status: None   Collection Time: 11/04/23  9:15 PM   Specimen: BLOOD  Result Value Ref Range Status   Specimen Description BLOOD BLOOD LEFT ARM  Final    Special Requests   Final    BOTTLES DRAWN AEROBIC AND ANAEROBIC Blood Culture results may not be optimal due to an inadequate volume of blood received in culture bottles   Culture   Final    NO GROWTH 5 DAYS Performed at [facility], [address] [place], [place] [address]    Report Status 11/09/2023 FINAL  Final  MRSA Next Gen by PCR, Nasal     Status: None   Collection Time: 11/05/23  1:04 AM   Specimen: Nasal Mucosa; Nasal Swab  Result Value Ref Range Status   MRSA by PCR Next Gen NOT DETECTED NOT DETECTED Final    Comment: (NOTE) The GeneXpert MRSA Assay (FDA approved for NASAL specimens only), is one component of a comprehensive MRSA colonization surveillance program. It is not intended to diagnose MRSA infection nor to guide  or monitor treatment for MRSA infections. Test performance is not FDA approved in patients less than 59 years old. Performed at [facility], [address] [place], [place] [address]          Radiology Studies: No results found.       Scheduled Meds:  acidophilus  1 capsule Oral BID   apixaban  5 mg Oral BID   diltiazem   360 mg Oral Daily   feeding supplement  237 mL Oral BID BM   FLUoxetine   10 mg Oral Daily   gabapentin   100 mg Oral BID   latanoprost   1 drop Both Eyes QHS   levalbuterol  1.25 mg Nebulization TID   lidocaine  1 patch Transdermal Q24H   melatonin  5 mg Oral QHS   memantine   10 mg Oral BID   methylPREDNISolone  (SOLU-MEDROL ) injection  40 mg Intravenous Daily   metoprolol tartrate  25 mg Oral BID   multivitamin with minerals  1 tablet Oral Daily   mouth rinse  15 mL Mouth Rinse 4 times per day   pantoprazole   40 mg Oral Daily   umeclidinium-vilanterol  1 puff Inhalation Daily   zolpidem   5 mg Oral QHS   Continuous Infusions:     LOS: 8 days   Time spent 35 minutes  Cresencio Fairly, MD [facility]   If 7PM-7AM, please contact night-coverage  11/12/2023, 7:04 PM

## 2023-11-12 NOTE — Progress Notes (Signed)
 Rounding Note   Patient Name: Rachel [name] Date of Encounter: 11/12/2023  [facility] Cardiologist: None   Subjective No acute events overnight, still in atrial fibrillation, heart rate 98-1 18.  Satting 5 L on oxygen per nasal cannula.  Overall her breathing seems to be better.  Scheduled Meds:  acidophilus  1 capsule Oral BID   apixaban  5 mg Oral BID   diltiazem   360 mg Oral Daily   feeding supplement  237 mL Oral BID BM   FLUoxetine   10 mg Oral Daily   gabapentin   100 mg Oral BID   latanoprost   1 drop Both Eyes QHS   levalbuterol  1.25 mg Nebulization TID   lidocaine  1 patch Transdermal Q24H   melatonin  5 mg Oral QHS   memantine   10 mg Oral BID   methylPREDNISolone  (SOLU-MEDROL ) injection  40 mg Intravenous Daily   metoprolol tartrate  25 mg Oral BID   multivitamin with minerals  1 tablet Oral Daily   mouth rinse  15 mL Mouth Rinse 4 times per day   pantoprazole   40 mg Oral Daily   umeclidinium-vilanterol  1 puff Inhalation Daily   zolpidem   5 mg Oral QHS   Continuous Infusions:  PRN Meds: acetaminophen  **OR** acetaminophen , albuterol , alum & mag hydroxide-simeth, ondansetron  **OR** ondansetron  (ZOFRAN ) IV, mouth rinse   Vital Signs  Vitals:   11/12/23 0651 11/12/23 0753 11/12/23 0802 11/12/23 1140  BP:  (!) 119/94  124/85  Pulse:  83  92  Resp:  16    Temp:  97.7 F (36.5 C)  97.6 F (36.4 C)  TempSrc:      SpO2: 96% 96% 95% 96%  Weight:      Height:        Intake/Output Summary (Last 24 hours) at 11/12/2023 1315 Last data filed at 11/12/2023 1019 Gross per 24 hour  Intake 950 ml  Output 650 ml  Net 300 ml      11/12/2023    5:10 AM 11/11/2023    5:00 AM 11/10/2023    5:00 AM  Last 3 Weights  Weight (lbs) 278 lb 10.6 oz 264 lb 8.8 oz 275 lb 12.7 oz  Weight (kg) 126.4 kg 120 kg 125.1 kg      Telemetry Atrial fibrillation heart rate 98-118- Personally Reviewed  ECG   - Personally Reviewed  Physical Exam GEN: Obese,  soft-spoken Neck: No JVD Cardiac: Irregular irregular Respiratory: Diminished breath sounds bilaterally GI: Soft, nontender, distended  MS: No edema; No deformity. Neuro:  Nonfocal  Psych: Normal affect   Labs High Sensitivity Troponin:   Recent Labs  Lab 11/04/23 2114 11/04/23 2300  TROPONINIHS 44* 39*     Chemistry Recent Labs  Lab 11/09/23 0419 11/10/23 1332 11/11/23 1027 11/12/23 0343  NA 136 136 140 136  K 4.8 5.1 4.3 5.4*  CL 103 104 104 103  CO2 24 20* 23 24  GLUCOSE 150* 143* 124* 162*  BUN 76* 81* 83* 94*  CREATININE 1.67* 1.50* 1.54* 1.71*  CALCIUM  9.3 9.6 9.4 9.1  MG 2.4  --  2.3 2.3  GFRNONAA 31* 35* 34* 30*  ANIONGAP 9 12 13 9     Lipids No results for input(s): CHOL, TRIG, HDL, LABVLDL, LDLCALC, CHOLHDL in the last 168 hours.  Hematology Recent Labs  Lab 11/09/23 0424 11/10/23 0418 11/11/23 0414  WBC 15.6* 19.5* 18.2*  RBC 3.22* 3.22* 3.28*  HGB 9.8* 10.0* 9.9*  HCT 30.3* 31.2* 31.3*  MCV  94.1 96.9 95.4  MCH 30.4 31.1 30.2  MCHC 32.3 32.1 31.6  RDW 14.3 14.6 14.8  PLT 238 257 247   Thyroid  Recent Labs  Lab 11/07/23 0623  TSH 0.848    BNPNo results for input(s): BNP, PROBNP in the last 168 hours.  DDimer No results for input(s): DDIMER in the last 168 hours.   Radiology  No results found.  Cardiac Studies Echo 11/05/2023 EF 60 to 65%  Patient Profile   78 y.o. female with history of COPD, respiratory failure on oxygen, hypertension, morbid obesity presents in respiratory distress, being seen for A-fib RVR.   Assessment & Plan  1.  Persistent atrial fibrillation -Heart rate reasonably controlled.  Occasional tachycardia. -Respiratory dysfunction contributing.  Continue Lopressor 12.5 mg twice daily. - Continue Cardizem  to 360 mg daily. -Continue Eliquis 5 mg twice daily.   2.  Respiratory failure, COPD on home oxygen -Management, antibiotics as per primary team   3.  Morbid obesity, likely from sleep  apnea. - Sleep study as outpatient     Signed, Redell Cave, MD  11/12/2023, 1:15 PM

## 2023-11-12 NOTE — Plan of Care (Signed)
  Problem: Education: Goal: Knowledge of General Education information will improve Description: Including pain rating scale, medication(s)/side effects and non-pharmacologic comfort measures Outcome: Progressing   Problem: Health Behavior/Discharge Planning: Goal: Ability to manage health-related needs will improve Outcome: Progressing   Problem: Clinical Measurements: Goal: Ability to maintain clinical measurements within normal limits will improve Outcome: Progressing Goal: Respiratory complications will improve Outcome: Progressing   Problem: Activity: Goal: Risk for activity intolerance will decrease Outcome: Progressing   Problem: Nutrition: Goal: Adequate nutrition will be maintained Outcome: Progressing   Problem: Elimination: Goal: Will not experience complications related to urinary retention Outcome: Progressing   Problem: Activity: Goal: Ability to tolerate increased activity will improve Outcome: Progressing Goal: Will verbalize the importance of balancing activity with adequate rest periods Outcome: Progressing   Problem: Respiratory: Goal: Ability to maintain a clear airway will improve Outcome: Progressing Goal: Levels of oxygenation will improve Outcome: Progressing Goal: Ability to maintain adequate ventilation will improve Outcome: Progressing

## 2023-11-12 NOTE — Progress Notes (Signed)
 Nutrition Follow-up  DOCUMENTATION CODES:   Morbid obesity  INTERVENTION:    -Continue 2 gram sodium diet for wider variety of meal selections -Continue MVI with minerals daily -Continue Magic cup BID with meals, each supplement provides 290 kcal and 9 grams of protein -Continue Ensure Plus High Protein po BID, each supplement provides 350 kcal and 20 grams of protein   NUTRITION DIAGNOSIS:   Increased nutrient needs related to acute illness as evidenced by estimated needs.  Ongoing  GOAL:   Patient will meet greater than or equal to 90% of their needs  Progressing   MONITOR:   PO intake, Supplement acceptance, Diet advancement  REASON FOR ASSESSMENT:   Consult Assessment of nutrition requirement/status  ASSESSMENT:   Pt with medical history significant for COPD and chronic respiratory failure on home O2 at 3 to 4 L, HTN, morbid obesity, history of cochlear implants being admitted with multiple acute medical issues including worsening respiratory failure secondary to COPD and CHF, pneumonia and new onset A-fib with RVR  6/25- advanced to heart healthy diet  6/29- s/p BSE- regular diet with thin liquids  Reviewed I/O's: -20 ml x 24 hours and -1 L since admission  UOP: 650 ml x 24 hours  Rapid response called on 6/20 after pt choked on eggs. SLP re-evaluated and recommends no change in diet consistency.   Pt remains on a 2 gram sodium diet. Meal completions 50-90%. Ensure supplements ordered on 11/07/23, which she is drinking.   Medications reviewed and include cardizem , lasix , neurontin , melatonin, solu-medrol , and protonix .   Wt has been stable over the past week.   Per TOC notes, pt is a resident of [facility]; plan to return at discharge. Per TOC notes, plan for hospice services at discharge.   Labs reviewed: K: 5.4, CBGS: 189 (inpatient orders for glycemic control are none).    Diet Order:   Diet Order             Diet 2 gram sodium Fluid  consistency: Thin  Diet effective now                   EDUCATION NEEDS:   No education needs have been identified at this time  Skin:  Skin Assessment: Reviewed RN Assessment  Last BM:  11/09/23  Height:   Ht Readings from Last 1 Encounters:  11/05/23 5' 6 (1.676 m)    Weight:   Wt Readings from Last 1 Encounters:  11/12/23 126.4 kg    Ideal Body Weight:  59.1 kg  BMI:  Body mass index is 44.98 kg/m.  Estimated Nutritional Needs:   Kcal:  1750-1950  Protein:  90-105 grams  Fluid:  1.7-1.9 L    Margery ORN, RD, LDN, CDCES Registered Dietitian III Certified Diabetes Care and Education Specialist If unable to reach this RD, please use RD Inpatient group chat on secure chat between hours of 8am-4 pm daily

## 2023-11-12 NOTE — Progress Notes (Addendum)
 Daily Progress Note   Patient Name: Rachel [name]       Date: 11/12/2023 DOB: 01-Dec-1945  Age: 78 y.o. MRN#: [id number] Attending Physician: Maree Hue, MD Primary Care Physician: Corlis Honor [name], MD Admit Date: 11/04/2023  Reason for Consultation/Follow-up: Establishing goals of care  Subjective: Note and labs reviewed. Patient to return to her facility with hospice to follow there. [facility] his been on-boarded to follow.  In to see patient, no family at bedside. Patient eating lunch, and denies needs at this time. PMT will sign off. Please reconsult if needs arise.    Length of Stay: 8  Current Medications: Scheduled Meds:   acidophilus  1 capsule Oral BID   apixaban  5 mg Oral BID   diltiazem   360 mg Oral Daily   feeding supplement  237 mL Oral BID BM   FLUoxetine   10 mg Oral Daily   gabapentin   100 mg Oral BID   latanoprost   1 drop Both Eyes QHS   levalbuterol  1.25 mg Nebulization TID   lidocaine  1 patch Transdermal Q24H   melatonin  5 mg Oral QHS   memantine   10 mg Oral BID   methylPREDNISolone  (SOLU-MEDROL ) injection  40 mg Intravenous Daily   metoprolol tartrate  25 mg Oral BID   multivitamin with minerals  1 tablet Oral Daily   mouth rinse  15 mL Mouth Rinse 4 times per day   pantoprazole   40 mg Oral Daily   umeclidinium-vilanterol  1 puff Inhalation Daily   zolpidem   5 mg Oral QHS    Continuous Infusions:   PRN Meds: acetaminophen  **OR** acetaminophen , albuterol , alum & mag hydroxide-simeth, ondansetron  **OR** ondansetron  (ZOFRAN ) IV, mouth rinse  Physical Exam Pulmonary:     Effort: Pulmonary effort is normal.  Neurological:     Mental Status: She is alert.             Vital Signs: BP 124/85 (BP Location: Left Wrist)   Pulse 92   Temp 97.6 F  (36.4 C)   Resp 16   Ht 5' 6 (1.676 m)   Wt 126.4 kg   SpO2 96%   BMI 44.98 kg/m  SpO2: SpO2: 96 % O2 Device: O2 Device: High Flow Nasal Cannula O2 Flow Rate: O2 Flow Rate (L/min): 5 L/min  Intake/output summary:  Intake/Output Summary (Last 24 hours) at 11/12/2023 1403 Last data filed at 11/12/2023 1019 Gross per 24 hour  Intake 950 ml  Output 650 ml  Net 300 ml   LBM: Last BM Date : 11/09/23 Baseline Weight: Weight: 121 kg Most recent weight: Weight: 126.4 kg  Patient Active Problem List   Diagnosis Date Noted   Right upper quadrant abdominal pain 11/08/2023   Acute on chronic congestive heart failure (HCC) 11/05/2023   Hypoxia 11/05/2023   Atrial fibrillation with rapid ventricular response, new onset (HCC) 11/04/2023   CAP (community acquired pneumonia) 11/04/2023   Sepsis (HCC) 11/04/2023   Hyponatremia 11/04/2023   Obesity, Class III, BMI 40-49.9 (morbid obesity) 11/04/2023   Acute respiratory failure with hypoxia (HCC) 11/04/2023   Hyperkalemia 11/18/2021   Acute on chronic diastolic CHF (congestive heart failure) (HCC) 11/16/2021   Shingles 07/15/2019   Acute confusion 07/15/2019   AKI (acute kidney injury) (HCC) 07/15/2019   COPD exacerbation (HCC) 07/14/2019   Acute on chronic respiratory failure with hypoxemia (HCC) 09/10/2018   Shortness of breath 12/10/2017   Smoker 12/10/2017   Essential hypertension 12/10/2017   Morbid obesity (HCC) 12/10/2017   Bilateral hip pain 12/10/2017   Dyslipidemia 12/10/2017   Hard of hearing 12/10/2017   Facial abscess 12/24/2015   Open wound of left lower leg 12/29/2013   Leg laceration 12/23/2013   Open wound of knee, leg (except thigh), and ankle, complicated 02/01/2013   Wound of right leg 01/13/2013    Palliative Care Assessment & Plan     Recommendations/Plan: Return to facility with hospice. PMT will sign off. Please reconsult if needs arise.    Code Status:    Code Status Orders  (From admission,  onward)           Start     Ordered   11/04/23 2358  Do not attempt resuscitation (DNR)- Limited -Do Not Intubate (DNI)  Continuous       Question Answer Comment  If pulseless and not breathing No CPR or chest compressions.   In Pre-Arrest Conditions (Patient Is Breathing and Has A Pulse) Do not intubate. Provide all appropriate non-invasive medical interventions. Avoid ICU transfer unless indicated or required.   Consent: Discussion documented in EHR or advanced directives reviewed      11/04/23 2359           Code Status History     Date Active Date Inactive Code Status Order ID Comments User Context   11/04/2023 2106 11/04/2023 2359 Partial Code [id number]  Suzanne Kirsch, MD ED   11/16/2021 1420 11/21/2021 0209 DNR [id number]  Tobie Calix, MD ED   11/16/2021 0436 11/16/2021 1420 Full Code [id number]  Cleatus Delayne GAILS, MD ED   07/15/2019 0010 07/20/2019 1906 Full Code [id number]  Cleatus Delayne GAILS, MD ED   09/10/2018 0346 09/11/2018 2043 Full Code [id number]  Stephania Ozell RAMAN Inpatient   12/24/2015 1734 12/27/2015 1502 Full Code [id number]  Sherial Bail, MD Inpatient      Advance Directive Documentation    Flowsheet Row Most Recent Value  Type of Advance Directive --  [Pt wants to wait until her sister gets here,  instructed her to let the nurse know to call the chaplain when she's ready to sign]  Pre-existing out of facility DNR order (yellow form or pink MOST form) --  MOST Form in Place? --    Thank you for allowing the Palliative Medicine Team to assist in the care of this patient.   Joselynne Killam  Signa, NP  Please contact Palliative Medicine Team phone at [phone] for questions and concerns.

## 2023-11-12 NOTE — Progress Notes (Signed)
 PT Cancellation Note  Patient Details Name: Rachel [name] MRN: [id number] DOB: 1945/07/13   Cancelled Treatment:     Per discussion with palliative care and patient, hold PT today. [facility] and will have hospice following her there. Acute PT will continue to follow per current POC.    Rankin KATHEE Essex 11/12/2023, 2:54 PM

## 2023-11-13 DIAGNOSIS — J9621 Acute and chronic respiratory failure with hypoxia: Secondary | ICD-10-CM | POA: Diagnosis not present

## 2023-11-13 DIAGNOSIS — Z515 Encounter for palliative care: Secondary | ICD-10-CM

## 2023-11-13 DIAGNOSIS — I509 Heart failure, unspecified: Secondary | ICD-10-CM | POA: Diagnosis not present

## 2023-11-13 DIAGNOSIS — A419 Sepsis, unspecified organism: Secondary | ICD-10-CM | POA: Diagnosis not present

## 2023-11-13 DIAGNOSIS — J9601 Acute respiratory failure with hypoxia: Secondary | ICD-10-CM | POA: Diagnosis not present

## 2023-11-13 DIAGNOSIS — I4891 Unspecified atrial fibrillation: Secondary | ICD-10-CM | POA: Diagnosis not present

## 2023-11-13 DIAGNOSIS — J441 Chronic obstructive pulmonary disease with (acute) exacerbation: Secondary | ICD-10-CM | POA: Diagnosis not present

## 2023-11-13 LAB — BASIC METABOLIC PANEL WITH GFR
Anion gap: 12 (ref 5–15)
BUN: 98 mg/dL — ABNORMAL HIGH (ref 8–23)
CO2: 24 mmol/L (ref 22–32)
Calcium: 9.5 mg/dL (ref 8.9–10.3)
Chloride: 101 mmol/L (ref 98–111)
Creatinine, Ser: 1.75 mg/dL — ABNORMAL HIGH (ref 0.44–1.00)
GFR, Estimated: 29 mL/min — ABNORMAL LOW (ref >60)
Glucose, Bld: 150 mg/dL — ABNORMAL HIGH (ref 70–99)
Potassium: 5 mmol/L (ref 3.5–5.1)
Sodium: 137 mmol/L (ref 135–145)

## 2023-11-13 LAB — MAGNESIUM: Magnesium: 2.4 mg/dL (ref 1.7–2.4)

## 2023-11-13 MED ORDER — APIXABAN 5 MG PO TABS: 5.0000 mg | ORAL_TABLET | Freq: Two times a day (BID) | ORAL | Status: DC

## 2023-11-13 MED ORDER — LIDOCAINE 5 % EX PTCH: 1.0000 | MEDICATED_PATCH | CUTANEOUS | 0 refills | Status: DC

## 2023-11-13 MED ORDER — ENSURE PLUS HIGH PROTEIN PO LIQD: 237.0000 mL | Freq: Two times a day (BID) | ORAL | Status: DC

## 2023-11-13 MED ORDER — HYDROCODONE-ACETAMINOPHEN 5-325 MG PO TABS: 1.0000 | ORAL_TABLET | ORAL | 0 refills | Status: DC | PRN

## 2023-11-13 MED ORDER — METOPROLOL TARTRATE 25 MG PO TABS: 25.0000 mg | ORAL_TABLET | Freq: Two times a day (BID) | ORAL | Status: DC

## 2023-11-13 MED ORDER — ALBUTEROL SULFATE (2.5 MG/3ML) 0.083% IN NEBU: 2.5000 mg | INHALATION_SOLUTION | RESPIRATORY_TRACT | Status: DC | PRN

## 2023-11-13 MED ORDER — HYDROCODONE-ACETAMINOPHEN 5-325 MG PO TABS: 1.0000 | ORAL_TABLET | ORAL | 0 refills | Status: AC | PRN

## 2023-11-13 MED ORDER — CALCIUM CARBONATE ANTACID 500 MG PO CHEW
1.0000 | CHEWABLE_TABLET | ORAL | Status: AC
  Administered 2023-11-13: 200 mg via ORAL
  Filled 2023-11-13: qty 1

## 2023-11-13 MED ORDER — DILTIAZEM HCL ER COATED BEADS 360 MG PO CP24: 360.0000 mg | ORAL_CAPSULE | Freq: Every day | ORAL | Status: DC

## 2023-11-13 NOTE — Progress Notes (Signed)
 Heart Failure Navigator Progress Note  Per Palliative care note patient will be discharged today with hospice to follow at [facility]. New TOC appointment was cancelled.    Navigator will sign off at this time.  Charmaine Pines, RN, BSN [facility] Heart Failure Navigator Secure Chat Only

## 2023-11-13 NOTE — Progress Notes (Signed)
 Pt is off Bipap, per her request.

## 2023-11-13 NOTE — TOC Transition Note (Addendum)
 Transition of Care [facility]) - Discharge Note   Patient Details  Name: Rachel [name] MRN: [id number] Date of Birth: [birth year]  [facility] CM/SW Contact:  Lauraine [name] Carpen, LCSW Phone Number: 11/13/2023, 1:20 PM   Clinical Narrative: Patient has orders to discharge to [facility] SN[facility] today. Bipap has been delivered to the facility. They are ordering a 10 L concentrator in case patient ends up needing one. RN has already called report. [facility] has been arranged and they said there are 3 or 4 ahead of her on the list. No further concerns. CSW signing off.  Final next level of care: Skilled Nursing Facility Barriers to Discharge: Barriers Resolved   Patient Goals and CMS Choice            Discharge Placement   Existing PASRR number confirmed : 11/12/23          Patient chooses bed at: Other - please specify in the comment section below: ([facility]) Patient to be transferred to facility by: [facility] Name of family member notified: Randine Mclean Patient and family notified of of transfer: 11/13/23  Discharge Plan and Services Additional resources added to the After Visit Summary for     Discharge Planning Services: CM Consult Post Acute Care Choice: Skilled Nursing Facility                               Social Drivers of Health (SDOH) Interventions SDOH Screenings   Food Insecurity: No Food Insecurity (11/10/2023)  Housing: Unknown (11/10/2023)  Transportation Needs: No Transportation Needs (11/10/2023)  Utilities: Patient Unable To Answer (11/05/2023)  Social Connections: Patient Unable To Answer (11/05/2023)  Tobacco Use: Medium Risk (11/10/2023)     Readmission Risk Interventions     No data to display

## 2023-11-13 NOTE — Progress Notes (Signed)
 Rounding Note   Patient Name: Rachel [name] Date of Encounter: 11/13/2023  [facility] Cardiologist: Davene  Subjective Laying supine in bed, mildly short of breath on 5 L nasal cannula high flow oxygen She was just up to the chair to eat breakfast, get cleaned up, exertion made her short of breath  Scheduled Meds:  acidophilus  1 capsule Oral BID   apixaban  5 mg Oral BID   diltiazem   360 mg Oral Daily   feeding supplement  237 mL Oral BID BM   FLUoxetine   10 mg Oral Daily   gabapentin   100 mg Oral BID   latanoprost   1 drop Both Eyes QHS   levalbuterol  1.25 mg Nebulization TID   lidocaine  1 patch Transdermal Q24H   melatonin  5 mg Oral QHS   memantine   10 mg Oral BID   methylPREDNISolone  (SOLU-MEDROL ) injection  40 mg Intravenous Daily   metoprolol tartrate  25 mg Oral BID   multivitamin with minerals  1 tablet Oral Daily   mouth rinse  15 mL Mouth Rinse 4 times per day   pantoprazole   40 mg Oral Daily   umeclidinium-vilanterol  1 puff Inhalation Daily   zolpidem   5 mg Oral QHS   Continuous Infusions:  PRN Meds: acetaminophen  **OR** acetaminophen , albuterol , alum & mag hydroxide-simeth, ondansetron  **OR** ondansetron  (ZOFRAN ) IV, mouth rinse   Vital Signs  Vitals:   11/13/23 0534 11/13/23 0747 11/13/23 0946 11/13/23 0947  BP:  124/78    Pulse:  (!) 57  81  Resp:  14    Temp:  (!) 97.5 F (36.4 C)    TempSrc:      SpO2:  95% 91%   Weight: 124.2 kg     Height:        Intake/Output Summary (Last 24 hours) at 11/13/2023 1020 Last data filed at 11/13/2023 0346 Gross per 24 hour  Intake --  Output 2100 ml  Net -2100 ml      11/13/2023    5:34 AM 11/12/2023    5:10 AM 11/11/2023    5:00 AM  Last 3 Weights  Weight (lbs) 273 lb 13 oz 278 lb 10.6 oz 264 lb 8.8 oz  Weight (kg) 124.2 kg 126.4 kg 120 kg      Telemetry Atrial fibrillation rate 90-110- Personally Reviewed  ECG   - Personally Reviewed  Physical Exam  GEN: No acute distress.  Short of  breath on nasal cannula oxygen Neck: No JVD Cardiac: RRR, no murmurs, rubs, or gallops.  Respiratory: Clear GI: Soft, nontender, non-distended  MS: No edema; No deformity. Neuro:  Nonfocal  Psych: Normal affect   Labs High Sensitivity Troponin:   Recent Labs  Lab 11/04/23 2114 11/04/23 2300  TROPONINIHS 44* 39*     Chemistry Recent Labs  Lab 11/11/23 1027 11/12/23 0343 11/13/23 0525  NA 140 136 137  K 4.3 5.4* 5.0  CL 104 103 101  CO2 23 24 24   GLUCOSE 124* 162* 150*  BUN 83* 94* 98*  CREATININE 1.54* 1.71* 1.75*  CALCIUM  9.4 9.1 9.5  MG 2.3 2.3 2.4  GFRNONAA 34* 30* 29*  ANIONGAP 13 9 12     Lipids No results for input(s): CHOL, TRIG, HDL, LABVLDL, LDLCALC, CHOLHDL in the last 168 hours.  Hematology Recent Labs  Lab 11/09/23 0424 11/10/23 0418 11/11/23 0414  WBC 15.6* 19.5* 18.2*  RBC 3.22* 3.22* 3.28*  HGB 9.8* 10.0* 9.9*  HCT 30.3* 31.2* 31.3*  MCV 94.1  96.9 95.4  MCH 30.4 31.1 30.2  MCHC 32.3 32.1 31.6  RDW 14.3 14.6 14.8  PLT 238 257 247   Thyroid  Recent Labs  Lab 11/07/23 0623  TSH 0.848    BNPNo results for input(s): BNP, PROBNP in the last 168 hours.  DDimer No results for input(s): DDIMER in the last 168 hours.   Radiology  No results found.  Cardiac Studies Echo November 05, 2023 Left ventricular ejection fraction, by estimation, is 60 to 65%. The  left ventricle has normal function. The left ventricle has no regional  wall motion abnormalities. Left ventricular diastolic parameters are  indeterminate.   2. Right ventricular systolic function is normal. The right ventricular  size is normal. There is normal pulmonary artery systolic pressure. The  estimated right ventricular systolic pressure is 27.8 mmHg.   3. The mitral valve is normal in structure. No evidence of mitral valve  regurgitation. No evidence of mitral stenosis.   4. The aortic valve is normal in structure. Aortic valve regurgitation is  not visualized.  No aortic stenosis is present.   5. The inferior vena cava is normal in size with greater than 50%  respiratory variability, suggesting right atrial pressure of 3 mmHg.   Patient Profile   Ms. Imojean Yoshino is a 78 year old woman with history of COPD, chronic respiratory failure on 3 to 4 L nasal cannula, hypertension, morbid obesity presenting from nursing facility November 04, 2023 with respiratory distress, tachycardia, atrial fibrillation with RVR, on BiPAP   Assessment & Plan    Atrial fibrillation with RVR Timing of onset unclear, prior EKG July 2023 normal sinus rhythm Initially treated with heparin  and diltiazem  infusion Rate driven by respiratory distress Medication titration was limited secondary to hypotension Now on Eliquis 5 twice daily, diltiazem  360 daily metoprolol tartrate 25 twice daily Rate reasonable, could increase in the setting of respiratory distress or overexertion but typically down to reasonable rate with rest and oxygen supplementation   Acute on chronic respiratory distress In the setting of atrial fibrillation with RVR rate 180 bpm on arrival -Initially treated with BiPAP, IV Lasix , antibiotics, steroids, nebs - Likely close to her baseline, on 4 to 5 L nasal cannula  - Followed by hospice   CAP/sepsis/COPD exacerbation -Weaned off BiPAP, now on nasal cannula oxygen -Likely close to her baseline -On arrival there was concern for sepsis picture with fever tachycardia leukocytosis lactic acidosis which has since improved -BiPAP has been weaned now on nasal cannula oxygen  [facility] will sign off.   Medication Recommendations: Continue medications above Other recommendations (labs, testing, etc): No further testing Follow up as an outpatient: Outpatient follow-up as patient desires, notes indicating she is going back to nursing facility on hospice  For questions or updates, please contact [facility] Please consult [email or web address] for  contact info under     Signed, Breeann Reposa, MD  11/13/2023, 10:20 AM

## 2023-11-13 NOTE — Discharge Summary (Signed)
 Physician Discharge Summary   Patient: Rachel [name] MRN: [id number] DOB: 1945-06-17  Admit date:     11/04/2023  Discharge date: 11/13/23  Discharge Physician: Cresencio Fairly   PCP: Corlis Honor [name], MD   Recommendations at discharge:   Hospice at [facility]  Discharge Diagnoses: Principal Problem:   Acute respiratory failure with hypoxia [facility]) Active Problems:   Acute on chronic respiratory failure with hypoxemia (HCC)   CAP (community acquired pneumonia)   Sepsis (HCC)   COPD exacerbation (HCC)   Atrial fibrillation with rapid ventricular response, new onset (HCC)   Acute on chronic diastolic CHF (congestive heart failure) (HCC)   Hyponatremia   Essential hypertension   Obesity, Class III, BMI 40-49.9 (morbid obesity)   Acute on chronic congestive heart failure (HCC)   Hypoxia   Right upper quadrant abdominal pain   Hospice care patient  Hospital Course: Assessment and Plan:  78 y.o. female with medical history significant for COPD and chronic respiratory failure on home O2 at 3 to 4 L, HTN, morbid obesity, history of cochlear implants being admitted with multiple acute medical issues including worsening respiratory failure secondary to COPD and CHF, pneumonia and new onset A-fib with RVR.  She is currently on BiPAP.  She presented by EMS with CPAP in place after they received a call for acute respiratory distress and a fever.  With EMS she received IV Tylenol , Solu-Medrol , DuoNebs and an IV diltiazem  push.    7/2: Planning for hospice at the facility/long-term care   Acute on chronic respiratory failure with hypoxemia (HCC) Secondary to CAP/sepsis, and CHF likely triggered by rapid A-fib PE among the differentials but lower suspicion.  Renal function precludes CTA. Required BiPAP at night.   Weaned to 5 L as of 6/28 Plan for hospice at the [facility]    Aspiration event Rapid response called on 6/20 9 AM after patient choked on eggs IM and was found to be coughing with rapid  atrial fibrillation heart rate up to 170s on monitor.  Responded to bedside.  Patient was sitting up in bed.  Coughing substantially.  Able to speak and answer questions.  No signs of airway compromise. Plan: Seen by SLP.  No change in dietary consistency at this time.  Isolated aspiration event.  Ensure patient is sitting as upright as possible and remains upright for at least 30 minutes after eating.  Aspiration precautions   CAP (community acquired pneumonia) Sepsis COPD exacerbation Sepsis criteria include fever, tachycardia and tachypnea with leukocytosis and lactic acidosis, airspace disease on chest x-ray Received sepsis fluid bolus in the ED Completed course of antibiotics.  No IV fluids   Atrial fibrillation with rapid ventricular response, new onset (HCC) Heart rate improved.  Initially treated with heparin  and diltiazem  infusion Rate driven by respiratory distress Medication titration was limited secondary to hypotension Now on Eliquis 5 twice daily, diltiazem  360 daily metoprolol tartrate 25 twice daily Rate reasonable, could increase in the setting of respiratory distress or overexertion but typically down to reasonable rate with rest and oxygen supplementation   Acute on chronic diastolic CHF (congestive heart failure) (HCC) BNP mildly elevated.  Chest x-ray with edema.  Received a dose of IV Lasix  in ED. Lasix  resumed at discharge   Hyponatremia Resolved.  Careful when on diuresis   Obesity, Class III, BMI 40-49.9 (morbid obesity) Complicating factor in overall care and prognosis   Essential hypertension Continue Cardizem  CD and metoprolol as above  Consultants: Cardiology, palliative care  Disposition: Hospice care Diet recommendation:  Discharge Diet Orders (From admission, onward)     Start     Ordered   11/13/23 0000  Diet - low sodium heart healthy        11/13/23 1121           Carb modified diet DISCHARGE MEDICATION: Allergies as of  11/13/2023   No Known Allergies      Medication List     STOP taking these medications    diltiazem  90 MG tablet Commonly known as: CARDIZEM        TAKE these medications    acetaminophen  650 MG CR tablet Commonly known as: TYLENOL  Take 650 mg by mouth every 8 (eight) hours.   Acidophilus Probiotic Tabs Take 1 tablet by mouth in the morning and at bedtime.   albuterol  (2.5 MG/3ML) 0.083% nebulizer solution Commonly known as: PROVENTIL  Take 3 mLs (2.5 mg total) by nebulization every 2 (two) hours as needed for shortness of breath.   apixaban 5 MG Tabs tablet Commonly known as: ELIQUIS Take 1 tablet (5 mg total) by mouth 2 (two) times daily.   Benefiber Chew Chew 1 tablet by mouth daily.   diltiazem  360 MG 24 hr capsule Commonly known as: CARDIZEM  CD Take 1 capsule (360 mg total) by mouth daily. Start taking on: November 14, 2023   feeding supplement Liqd Take 237 mLs by mouth 2 (two) times daily between meals.   FLUoxetine  10 MG capsule Commonly known as: PROZAC  Take 10 mg by mouth daily.   gabapentin  100 MG capsule Commonly known as: NEURONTIN  Take 100 mg by mouth 2 (two) times daily.   Geri-Lanta 200-200-20 MG/5ML suspension Generic drug: alum & mag hydroxide-simeth Take 15 mLs by mouth every 8 (eight) hours as needed.   hydrochlorothiazide 25 MG tablet Commonly known as: HYDRODIURIL Take 25 mg by mouth daily.   HYDROcodone -acetaminophen  5-325 MG tablet Commonly known as: NORCO/VICODIN Take 1 tablet by mouth every 4 (four) hours as needed for up to 3 days for moderate pain (pain score 4-6) or severe pain (pain score 7-10). What changed:  when to take this reasons to take this Another medication with the same name was removed. Continue taking this medication, and follow the directions you see here.   ipratropium-albuterol  0.5-2.5 (3) MG/3ML Soln Commonly known as: DUONEB Take 3 mLs by nebulization every 6 (six) hours as needed.   latanoprost  0.005 %  ophthalmic solution Commonly known as: XALATAN  Place 1 drop into both eyes at bedtime.   lidocaine 5 % Commonly known as: LIDODERM Place 1 patch onto the skin daily. Remove & Discard patch within 12 hours or as directed by MD Start taking on: November 14, 2023   lovastatin 20 MG tablet Commonly known as: MEVACOR Take 20 mg by mouth at bedtime.   melatonin 3 MG Tabs tablet Take 6 mg by mouth at bedtime.   memantine  10 MG tablet Commonly known as: NAMENDA  Take 10 mg by mouth 2 (two) times daily.   metoprolol tartrate 25 MG tablet Commonly known as: LOPRESSOR Take 1 tablet (25 mg total) by mouth 2 (two) times daily.   omeprazole 20 MG capsule Commonly known as: PRILOSEC Take 20 mg by mouth daily.   ondansetron  4 MG disintegrating tablet Commonly known as: ZOFRAN -ODT Take 4 mg by mouth every 6 (six) hours as needed for nausea or vomiting.   umeclidinium-vilanterol 62.5-25 MCG/INH Aepb Commonly known as: Anoro Ellipta  Inhale 1 puff into the  lungs daily.   zolpidem  5 MG tablet Commonly known as: AMBIEN  Take 5 mg by mouth at bedtime.        Contact information for follow-up providers     [facility]. Go on 11/19/2023.   Specialty: Cardiology Why: Hospital Follow-Up-11/19/23 @ 11:00  Please bring all medications to follow-up appointment' [facility], [address] Floor Contact information: [address] [place]  [address] [phone]             Contact information for after-discharge care     Destination     [facility] .   Service: Skilled Nursing Contact information: [address] [place]  [address] [phone]                    Discharge Exam: Filed Weights   11/11/23 0500 11/12/23 0510 11/13/23 0534  Weight: 120 kg 126.4 kg 124.2 kg   General exam: No acute distress.  Appears fatigued Respiratory system: Coarse  breath sounds bilaterally.  Mild wheeze.  Normal work of breathing.  5 L Cardiovascular system: S1-S2, regular rate, irregular rhythm, no murmurs, trace pedal edema BLE Gastrointestinal system: Soft, NT/ND, normal bowel sounds Central nervous system: Alert and oriented. No focal neurological deficits. Extremities: Symmetric 5 x 5 power. Skin: No rashes, lesions or ulcers Psychiatry: Judgement and insight appear normal. Mood & affect appropriate.   Condition at discharge: poor  The results of significant diagnostics from this hospitalization (including imaging, microbiology, ancillary and laboratory) are listed below for reference.   Imaging Studies: US  Abdomen Limited RUQ (LIVER/GB) Result Date: 11/08/2023 CLINICAL DATA:  Right upper quadrant pain EXAM: ULTRASOUND ABDOMEN LIMITED RIGHT UPPER QUADRANT COMPARISON:  None Available. FINDINGS: Gallbladder: No gallstones or wall thickening visualized. No sonographic Murphy sign noted by sonographer. Common bile duct: Diameter: 3 mm. Liver: No focal lesion identified. Increased parenchymal echogenicity. Portal vein is patent on color Doppler imaging with normal direction of blood flow towards the liver. Other: None. IMPRESSION: 1. Hepatic steatosis. 2. No cholelithiasis or evidence of acute cholecystitis. Electronically Signed   By: Norman Gatlin M.D.   On: 11/08/2023 19:21   DG Chest Port 1 View Result Date: 11/07/2023 CLINICAL DATA:  200808 Hypoxia 200808. EXAM: PORTABLE CHEST 1 VIEW COMPARISON:  11/04/2023. FINDINGS: Redemonstration of coarse interstitial pattern with hyperlucency of the lung fields, asymmetrically involving the right mid lung zone, concerning for underlying COPD changes. There are stable heterogeneous nonspecific opacities overlying bilateral mid lower lung zones without significant interval change. These may represent combination of atelectasis/scarring and pneumonitis. Correlate clinically. No pneumothorax on either side. There is  subtle blunting of bilateral lateral costophrenic angles which may represent bilateral trace pleural effusions. Stable cardio-mediastinal silhouette. No acute osseous abnormalities. The soft tissues are within normal limits. IMPRESSION: No significant interval change since the prior study. Electronically Signed   By: Ree Molt M.D.   On: 11/07/2023 08:32   ECHOCARDIOGRAM COMPLETE Result Date: 11/05/2023    ECHOCARDIOGRAM REPORT   Patient Name:   TEARRA OUK Date of Exam: 11/05/2023 Medical Rec #:  [id number]          Height:       66.0 in Accession #:    [id number]         Weight:       273.1 lb Date of Birth:  01/13/1946         BSA:  2.283 m Patient Age:    77 years           BP:           115/63 mmHg Patient Gender: F                  HR:           99 bpm. Exam Location:  [facility] Procedure: 2D Echo, Cardiac Doppler, Color Doppler and Intracardiac            Opacification Agent (Both Spectral and Color Flow Doppler were            utilized during procedure). Indications:     CHF-Acute Diastolic I50.31  History:         Patient has prior history of Echocardiogram examinations, most                  recent 11/16/2021. CHF.  Sonographer:     Rosina Dunk Referring Phys:  [id number] DELAYNE LULLA SOLIAN Diagnosing Phys: Evalene Lunger MD IMPRESSIONS  1. Left ventricular ejection fraction, by estimation, is 60 to 65%. The left ventricle has normal function. The left ventricle has no regional wall motion abnormalities. Left ventricular diastolic parameters are indeterminate.  2. Right ventricular systolic function is normal. The right ventricular size is normal. There is normal pulmonary artery systolic pressure. The estimated right ventricular systolic pressure is 27.8 mmHg.  3. The mitral valve is normal in structure. No evidence of mitral valve regurgitation. No evidence of mitral stenosis.  4. The aortic valve is normal in structure. Aortic valve regurgitation is not visualized. No aortic stenosis  is present.  5. The inferior vena cava is normal in size with greater than 50% respiratory variability, suggesting right atrial pressure of 3 mmHg. FINDINGS  Left Ventricle: Left ventricular ejection fraction, by estimation, is 60 to 65%. The left ventricle has normal function. The left ventricle has no regional wall motion abnormalities. Definity  contrast agent was given IV to delineate the left ventricular  endocardial borders. Strain was performed and the global longitudinal strain is indeterminate. The left ventricular internal cavity size was normal in size. There is no left ventricular hypertrophy. Left ventricular diastolic parameters are indeterminate. Right Ventricle: The right ventricular size is normal. No increase in right ventricular wall thickness. Right ventricular systolic function is normal. There is normal pulmonary artery systolic pressure. The tricuspid regurgitant velocity is 2.39 m/s, and  with an assumed right atrial pressure of 5 mmHg, the estimated right ventricular systolic pressure is 27.8 mmHg. Left Atrium: Left atrial size was normal in size. Right Atrium: Right atrial size was normal in size. Pericardium: There is no evidence of pericardial effusion. Mitral Valve: The mitral valve is normal in structure. There is mild calcification of the mitral valve leaflet(s). No evidence of mitral valve regurgitation. No evidence of mitral valve stenosis. MV peak gradient, 9.4 mmHg. The mean mitral valve gradient  is 4.0 mmHg. Tricuspid Valve: The tricuspid valve is normal in structure. Tricuspid valve regurgitation is not demonstrated. No evidence of tricuspid stenosis. Aortic Valve: The aortic valve is normal in structure. Aortic valve regurgitation is not visualized. No aortic stenosis is present. Aortic valve mean gradient measures 5.0 mmHg. Aortic valve peak gradient measures 8.5 mmHg. Aortic valve area, by VTI measures 2.78 cm. Pulmonic Valve: The pulmonic valve was normal in structure.  Pulmonic valve regurgitation is not visualized. No evidence of pulmonic stenosis. Aorta: The aortic root is normal in size and structure. Venous:  The inferior vena cava is normal in size with greater than 50% respiratory variability, suggesting right atrial pressure of 3 mmHg. IAS/Shunts: No atrial level shunt detected by color flow Doppler. Additional Comments: 3D was performed not requiring image post processing on an independent workstation and was indeterminate.  LEFT VENTRICLE PLAX 2D LVIDd:         4.95 cm     Diastology LVIDs:         3.90 cm     LV e' medial:    8.27 cm/s LV PW:         1.25 cm     LV E/e' medial:  19.1 LV IVS:        1.05 cm     LV e' lateral:   13.70 cm/s LVOT diam:     2.10 cm     LV E/e' lateral: 11.5 LV SV:         57 LV SV Index:   25 LVOT Area:     3.46 cm  LV Volumes (MOD) LV vol d, MOD A4C: 51.2 ml LV vol s, MOD A4C: 34.1 ml LV SV MOD A4C:     51.2 ml RIGHT VENTRICLE RV Basal diam:  4.30 cm RV Mid diam:    3.30 cm RV S prime:     17.10 cm/s TAPSE (M-mode): 1.8 cm LEFT ATRIUM             Index        RIGHT ATRIUM           Index LA Vol (A2C):   52.2 ml 22.86 ml/m  RA Area:     17.80 cm LA Vol (A4C):   60.6 ml 26.54 ml/m  RA Volume:   50.10 ml  21.94 ml/m LA Biplane Vol: 62.4 ml 27.33 ml/m  AORTIC VALVE AV Area (Vmax):    2.30 cm AV Area (Vmean):   2.31 cm AV Area (VTI):     2.78 cm AV Vmax:           146.00 cm/s AV Vmean:          98.500 cm/s AV VTI:            0.207 m AV Peak Grad:      8.5 mmHg AV Mean Grad:      5.0 mmHg LVOT Vmax:         96.90 cm/s LVOT Vmean:        65.700 cm/s LVOT VTI:          0.166 m LVOT/AV VTI ratio: 0.80  AORTA Ao Root diam: 3.30 cm MITRAL VALVE                TRICUSPID VALVE MV Area (PHT): 2.34 cm     TR Peak grad:   22.8 mmHg MV Area VTI:   1.80 cm     TR Mean grad:   17.0 mmHg MV Peak grad:  9.4 mmHg     TR Vmax:        239.00 cm/s MV Mean grad:  4.0 mmHg     TR Vmean:       202.0 cm/s MV Vmax:       1.53 m/s MV Vmean:      94.9 cm/s     SHUNTS MV Decel Time: 324 msec     Systemic VTI:  0.17 m MR Peak grad: 82.1 mmHg     Systemic Diam: 2.10 cm MR Vmax:  453.00 cm/s MV E velocity: 158.00 cm/s Evalene Lunger MD Electronically signed by Evalene Lunger MD Signature Date/Time: 11/05/2023/2:40:23 PM    Final    DG Chest Port 1 View Result Date: 11/04/2023 CLINICAL DATA:  Questionable sepsis - evaluate for abnormality EXAM: PORTABLE CHEST 1 VIEW COMPARISON:  11/16/2021 FINDINGS: Heart and mediastinal contours within normal limits. Aortic atherosclerosis. Bilateral lower lobe airspace opacities. No effusions. No acute bony abnormality. IMPRESSION: Bilateral lower lobe airspace opacities could reflect edema or infection. Electronically Signed   By: Franky Crease M.D.   On: 11/04/2023 21:36    Microbiology: Results for orders placed or performed during the hospital encounter of 11/04/23  Resp panel by RT-PCR (RSV, Flu A&B, Covid) Anterior Nasal Swab     Status: None   Collection Time: 11/04/23  9:14 PM   Specimen: Anterior Nasal Swab  Result Value Ref Range Status   SARS Coronavirus 2 by RT PCR NEGATIVE NEGATIVE Final    Comment: (NOTE) SARS-CoV-2 target nucleic acids are NOT DETECTED.  The SARS-CoV-2 RNA is generally detectable in upper respiratory specimens during the acute phase of infection. The lowest concentration of SARS-CoV-2 viral copies this assay can detect is 138 copies/mL. A negative result does not preclude SARS-Cov-2 infection and should not be used as the sole basis for treatment or other patient management decisions. A negative result may occur with  improper specimen collection/handling, submission of specimen other than nasopharyngeal swab, presence of viral mutation(s) within the areas targeted by this assay, and inadequate number of viral copies(<138 copies/mL). A negative result must be combined with clinical observations, patient history, and epidemiological information. The expected result is  Negative.  Fact Sheet for Patients:  BloggerCourse.com  Fact Sheet for Healthcare Providers:  SeriousBroker.it  This test is no t yet approved or cleared by the [place]  FDA and  has been authorized for detection and/or diagnosis of SARS-CoV-2 by FDA under an Emergency Use Authorization (EUA). This EUA will remain  in effect (meaning this test can be used) for the duration of the COVID-19 declaration under Section 564(b)(1) of the Act, 21 U.S.C.section 360bbb-3(b)(1), unless the authorization is terminated  or revoked sooner.       Influenza A by PCR NEGATIVE NEGATIVE Final   Influenza B by PCR NEGATIVE NEGATIVE Final    Comment: (NOTE) The Xpert Xpress SARS-CoV-2/FLU/RSV plus assay is intended as an aid in the diagnosis of influenza from Nasopharyngeal swab specimens and should not be used as a sole basis for treatment. Nasal washings and aspirates are unacceptable for Xpert Xpress SARS-CoV-2/FLU/RSV testing.  Fact Sheet for Patients: BloggerCourse.com  Fact Sheet for Healthcare Providers: SeriousBroker.it  This test is not yet approved or cleared by the [place]  FDA and has been authorized for detection and/or diagnosis of SARS-CoV-2 by FDA under an Emergency Use Authorization (EUA). This EUA will remain in effect (meaning this test can be used) for the duration of the COVID-19 declaration under Section 564(b)(1) of the Act, 21 U.S.C. section 360bbb-3(b)(1), unless the authorization is terminated or revoked.     Resp Syncytial Virus by PCR NEGATIVE NEGATIVE Final    Comment: (NOTE) Fact Sheet for Patients: BloggerCourse.com  Fact Sheet for Healthcare Providers: SeriousBroker.it  This test is not yet approved or cleared by the [place]  FDA and has been authorized for detection and/or diagnosis of  SARS-CoV-2 by FDA under an Emergency Use Authorization (EUA). This EUA will remain in effect (meaning this test can be used) for the duration of  the COVID-19 declaration under Section 564(b)(1) of the Act, 21 U.S.C. section 360bbb-3(b)(1), unless the authorization is terminated or revoked.  Performed at [facility], [address] [place], [place] [address]   Blood Culture (routine x 2)     Status: None   Collection Time: 11/04/23  9:14 PM   Specimen: BLOOD  Result Value Ref Range Status   Specimen Description BLOOD BLOOD RIGHT ARM  Final   Special Requests   Final    BOTTLES DRAWN AEROBIC AND ANAEROBIC Blood Culture adequate volume   Culture   Final    NO GROWTH 5 DAYS Performed at [facility], [address] [place], [place] [address]    Report Status 11/09/2023 FINAL  Final  Blood Culture (routine x 2)     Status: None   Collection Time: 11/04/23  9:15 PM   Specimen: BLOOD  Result Value Ref Range Status   Specimen Description BLOOD BLOOD LEFT ARM  Final   Special Requests   Final    BOTTLES DRAWN AEROBIC AND ANAEROBIC Blood Culture results may not be optimal due to an inadequate volume of blood received in culture bottles   Culture   Final    NO GROWTH 5 DAYS Performed at [facility], [address] [place], [place] [address]    Report Status 11/09/2023 FINAL  Final  MRSA Next Gen by PCR, Nasal     Status: None   Collection Time: 11/05/23  1:04 AM   Specimen: Nasal Mucosa; Nasal Swab  Result Value Ref Range Status   MRSA by PCR Next Gen NOT DETECTED NOT DETECTED Final    Comment: (NOTE) The GeneXpert MRSA Assay (FDA approved for NASAL specimens only), is one component of a comprehensive MRSA colonization surveillance program. It is not intended to diagnose MRSA infection nor to guide or monitor treatment for MRSA infections. Test performance is not FDA approved in patients less than 19 years old. Performed at [facility],  [address] [place], [place] [address]     Labs: CBC: Recent Labs  Lab 11/07/23 [phone] 11/08/23 0354 11/09/23 0424 11/10/23 0418 11/11/23 0414  WBC 13.4* 12.2* 15.6* 19.5* 18.2*  HGB 10.1* 9.8* 9.8* 10.0* 9.9*  HCT 31.0* 30.4* 30.3* 31.2* 31.3*  MCV 93.7 93.8 94.1 96.9 95.4  PLT 209 222 238 257 247   Basic Metabolic Panel: Recent Labs  Lab 11/08/23 0354 11/09/23 0419 11/10/23 1332 11/11/23 1027 11/12/23 0343 11/13/23 0525  NA 133* 136 136 140 136 137  K 5.0 4.8 5.1 4.3 5.4* 5.0  CL 102 103 104 104 103 101  CO2 22 24 20* 23 24 24   GLUCOSE 148* 150* 143* 124* 162* 150*  BUN 77* 76* 81* 83* 94* 98*  CREATININE 1.78* 1.67* 1.50* 1.54* 1.71* 1.75*  CALCIUM  9.1 9.3 9.6 9.4 9.1 9.5  MG 2.4 2.4  --  2.3 2.3 2.4   Liver Function Tests: No results for input(s): AST, ALT, ALKPHOS, BILITOT, PROT, ALBUMIN in the last 168 hours. CBG: No results for input(s): GLUCAP in the last 168 hours.  Discharge time spent: greater than 30 minutes.  Signed: Cresencio Fairly, MD [facility] 11/13/2023

## 2023-11-13 NOTE — Plan of Care (Signed)
 PMT following. Patient is discharging today with hospice to follow at facility. PMT will sign off. Please reconsult if needs arise.

## 2023-11-19 ENCOUNTER — Encounter: Admitting: Family

## 2023-12-12 DEATH — deceased
